# Patient Record
Sex: Female | Born: 1980 | Marital: Married | Smoking: Former smoker
Health system: Southern US, Community
[De-identification: ages and names within clinical notes are randomized; demographics above are authoritative.]

## PROBLEM LIST (undated history)

## (undated) ENCOUNTER — Inpatient Hospital Stay (HOSPITAL_COMMUNITY): Payer: Self-pay

## (undated) DIAGNOSIS — R011 Cardiac murmur, unspecified: Secondary | ICD-10-CM

## (undated) DIAGNOSIS — F53 Postpartum depression: Secondary | ICD-10-CM

## (undated) DIAGNOSIS — I10 Essential (primary) hypertension: Secondary | ICD-10-CM

## (undated) DIAGNOSIS — F418 Other specified anxiety disorders: Secondary | ICD-10-CM

## (undated) DIAGNOSIS — R569 Unspecified convulsions: Secondary | ICD-10-CM

## (undated) DIAGNOSIS — O99345 Other mental disorders complicating the puerperium: Secondary | ICD-10-CM

## (undated) HISTORY — DX: Essential (primary) hypertension: I10

## (undated) HISTORY — DX: Postpartum depression: F53.0

## (undated) HISTORY — DX: Other specified anxiety disorders: F41.8

## (undated) HISTORY — DX: Cardiac murmur, unspecified: R01.1

## (undated) HISTORY — DX: Morbid (severe) obesity due to excess calories: E66.01

## (undated) HISTORY — DX: Unspecified convulsions: R56.9

## (undated) HISTORY — DX: Other mental disorders complicating the puerperium: O99.345

---
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
## 2002-02-23 ENCOUNTER — Inpatient Hospital Stay (HOSPITAL_COMMUNITY): Admission: AD | Admit: 2002-02-23 | Discharge: 2002-02-23 | Payer: Self-pay | Admitting: *Deleted

## 2002-02-24 ENCOUNTER — Encounter: Payer: Self-pay | Admitting: Emergency Medicine

## 2002-02-24 ENCOUNTER — Emergency Department (HOSPITAL_COMMUNITY): Admission: EM | Admit: 2002-02-24 | Discharge: 2002-02-24 | Payer: Self-pay | Admitting: Emergency Medicine

## 2002-06-15 ENCOUNTER — Inpatient Hospital Stay (HOSPITAL_COMMUNITY): Admission: AD | Admit: 2002-06-15 | Discharge: 2002-06-15 | Payer: Self-pay | Admitting: Obstetrics and Gynecology

## 2002-06-18 ENCOUNTER — Inpatient Hospital Stay (HOSPITAL_COMMUNITY): Admission: AD | Admit: 2002-06-18 | Discharge: 2002-06-18 | Payer: Self-pay | Admitting: *Deleted

## 2002-06-25 ENCOUNTER — Inpatient Hospital Stay (HOSPITAL_COMMUNITY): Admission: AD | Admit: 2002-06-25 | Discharge: 2002-06-25 | Payer: Self-pay | Admitting: *Deleted

## 2002-07-05 ENCOUNTER — Inpatient Hospital Stay (HOSPITAL_COMMUNITY): Admission: AD | Admit: 2002-07-05 | Discharge: 2002-07-05 | Payer: Self-pay | Admitting: Obstetrics

## 2002-07-17 ENCOUNTER — Ambulatory Visit (HOSPITAL_COMMUNITY): Admission: RE | Admit: 2002-07-17 | Discharge: 2002-07-17 | Payer: Self-pay | Admitting: Pediatrics

## 2002-07-17 ENCOUNTER — Encounter: Payer: Self-pay | Admitting: Pediatrics

## 2002-08-07 ENCOUNTER — Inpatient Hospital Stay (HOSPITAL_COMMUNITY): Admission: AD | Admit: 2002-08-07 | Discharge: 2002-08-07 | Payer: Self-pay | Admitting: Obstetrics

## 2002-08-07 ENCOUNTER — Encounter: Payer: Self-pay | Admitting: Obstetrics

## 2002-09-06 ENCOUNTER — Inpatient Hospital Stay (HOSPITAL_COMMUNITY): Admission: AD | Admit: 2002-09-06 | Discharge: 2002-09-06 | Payer: Self-pay | Admitting: Obstetrics

## 2002-10-16 ENCOUNTER — Inpatient Hospital Stay (HOSPITAL_COMMUNITY): Admission: AD | Admit: 2002-10-16 | Discharge: 2002-10-16 | Payer: Self-pay | Admitting: Obstetrics

## 2002-12-04 ENCOUNTER — Inpatient Hospital Stay (HOSPITAL_COMMUNITY): Admission: AD | Admit: 2002-12-04 | Discharge: 2002-12-04 | Payer: Self-pay | Admitting: Obstetrics

## 2003-01-12 ENCOUNTER — Inpatient Hospital Stay (HOSPITAL_COMMUNITY): Admission: AD | Admit: 2003-01-12 | Discharge: 2003-01-12 | Payer: Self-pay | Admitting: Obstetrics

## 2003-01-21 ENCOUNTER — Inpatient Hospital Stay (HOSPITAL_COMMUNITY): Admission: AD | Admit: 2003-01-21 | Discharge: 2003-01-21 | Payer: Self-pay | Admitting: Obstetrics

## 2003-02-01 HISTORY — PX: BREAST SURGERY: SHX581

## 2003-02-08 ENCOUNTER — Inpatient Hospital Stay (HOSPITAL_COMMUNITY): Admission: AD | Admit: 2003-02-08 | Discharge: 2003-02-08 | Payer: Self-pay | Admitting: Obstetrics

## 2003-02-12 ENCOUNTER — Inpatient Hospital Stay (HOSPITAL_COMMUNITY): Admission: AD | Admit: 2003-02-12 | Discharge: 2003-02-12 | Payer: Self-pay | Admitting: Obstetrics

## 2003-02-19 ENCOUNTER — Inpatient Hospital Stay (HOSPITAL_COMMUNITY): Admission: AD | Admit: 2003-02-19 | Discharge: 2003-02-21 | Payer: Self-pay | Admitting: Obstetrics

## 2003-05-07 ENCOUNTER — Encounter: Admission: RE | Admit: 2003-05-07 | Discharge: 2003-05-07 | Payer: Self-pay | Admitting: Obstetrics

## 2003-05-15 ENCOUNTER — Encounter: Admission: RE | Admit: 2003-05-15 | Discharge: 2003-05-15 | Payer: Self-pay | Admitting: Obstetrics

## 2003-05-21 ENCOUNTER — Encounter: Admission: RE | Admit: 2003-05-21 | Discharge: 2003-05-21 | Payer: Self-pay | Admitting: Obstetrics

## 2003-06-02 ENCOUNTER — Encounter: Admission: RE | Admit: 2003-06-02 | Discharge: 2003-06-02 | Payer: Self-pay | Admitting: Obstetrics

## 2003-06-04 ENCOUNTER — Encounter (INDEPENDENT_AMBULATORY_CARE_PROVIDER_SITE_OTHER): Payer: Self-pay | Admitting: *Deleted

## 2003-06-04 ENCOUNTER — Ambulatory Visit (HOSPITAL_BASED_OUTPATIENT_CLINIC_OR_DEPARTMENT_OTHER): Admission: RE | Admit: 2003-06-04 | Discharge: 2003-06-04 | Payer: Self-pay | Admitting: *Deleted

## 2003-06-04 ENCOUNTER — Ambulatory Visit (HOSPITAL_COMMUNITY): Admission: RE | Admit: 2003-06-04 | Discharge: 2003-06-04 | Payer: Self-pay | Admitting: *Deleted

## 2003-09-24 ENCOUNTER — Emergency Department (HOSPITAL_COMMUNITY): Admission: EM | Admit: 2003-09-24 | Discharge: 2003-09-24 | Payer: Self-pay | Admitting: Emergency Medicine

## 2004-03-04 ENCOUNTER — Emergency Department (HOSPITAL_COMMUNITY): Admission: EM | Admit: 2004-03-04 | Discharge: 2004-03-04 | Payer: Self-pay | Admitting: Emergency Medicine

## 2004-07-29 ENCOUNTER — Emergency Department (HOSPITAL_COMMUNITY): Admission: EM | Admit: 2004-07-29 | Discharge: 2004-07-29 | Payer: Self-pay | Admitting: Family Medicine

## 2005-02-25 ENCOUNTER — Emergency Department (HOSPITAL_COMMUNITY): Admission: EM | Admit: 2005-02-25 | Discharge: 2005-02-25 | Payer: Self-pay | Admitting: Emergency Medicine

## 2005-05-04 ENCOUNTER — Emergency Department (HOSPITAL_COMMUNITY): Admission: EM | Admit: 2005-05-04 | Discharge: 2005-05-05 | Payer: Self-pay | Admitting: Emergency Medicine

## 2005-06-28 ENCOUNTER — Inpatient Hospital Stay (HOSPITAL_COMMUNITY): Admission: AD | Admit: 2005-06-28 | Discharge: 2005-06-28 | Payer: Self-pay | Admitting: Obstetrics

## 2005-12-02 ENCOUNTER — Inpatient Hospital Stay (HOSPITAL_COMMUNITY): Admission: AD | Admit: 2005-12-02 | Discharge: 2005-12-02 | Payer: Self-pay | Admitting: Obstetrics

## 2005-12-12 ENCOUNTER — Inpatient Hospital Stay (HOSPITAL_COMMUNITY): Admission: AD | Admit: 2005-12-12 | Discharge: 2005-12-12 | Payer: Self-pay | Admitting: Obstetrics

## 2005-12-14 ENCOUNTER — Inpatient Hospital Stay (HOSPITAL_COMMUNITY): Admission: AD | Admit: 2005-12-14 | Discharge: 2005-12-14 | Payer: Self-pay | Admitting: Obstetrics

## 2006-01-12 ENCOUNTER — Emergency Department (HOSPITAL_COMMUNITY): Admission: EM | Admit: 2006-01-12 | Discharge: 2006-01-12 | Payer: Self-pay | Admitting: Family Medicine

## 2007-08-16 ENCOUNTER — Emergency Department (HOSPITAL_COMMUNITY): Admission: EM | Admit: 2007-08-16 | Discharge: 2007-08-17 | Payer: Self-pay | Admitting: Emergency Medicine

## 2008-02-19 ENCOUNTER — Emergency Department (HOSPITAL_COMMUNITY): Admission: EM | Admit: 2008-02-19 | Discharge: 2008-02-19 | Payer: Self-pay | Admitting: Emergency Medicine

## 2008-12-16 ENCOUNTER — Emergency Department (HOSPITAL_BASED_OUTPATIENT_CLINIC_OR_DEPARTMENT_OTHER): Admission: EM | Admit: 2008-12-16 | Discharge: 2008-12-16 | Payer: Self-pay | Admitting: Emergency Medicine

## 2008-12-27 ENCOUNTER — Emergency Department (HOSPITAL_COMMUNITY): Admission: EM | Admit: 2008-12-27 | Discharge: 2008-12-27 | Payer: Self-pay | Admitting: Emergency Medicine

## 2009-01-20 ENCOUNTER — Emergency Department (HOSPITAL_BASED_OUTPATIENT_CLINIC_OR_DEPARTMENT_OTHER): Admission: EM | Admit: 2009-01-20 | Discharge: 2009-01-20 | Payer: Self-pay | Admitting: Emergency Medicine

## 2009-08-02 ENCOUNTER — Emergency Department (HOSPITAL_COMMUNITY): Admission: EM | Admit: 2009-08-02 | Discharge: 2009-08-03 | Payer: Self-pay | Admitting: Emergency Medicine

## 2010-02-17 ENCOUNTER — Inpatient Hospital Stay (HOSPITAL_COMMUNITY)
Admission: AD | Admit: 2010-02-17 | Discharge: 2010-02-17 | Payer: Self-pay | Source: Home / Self Care | Attending: Obstetrics and Gynecology | Admitting: Obstetrics and Gynecology

## 2010-02-22 LAB — URINALYSIS, ROUTINE W REFLEX MICROSCOPIC
Hgb urine dipstick: NEGATIVE
Ketones, ur: NEGATIVE mg/dL
Urine Glucose, Fasting: NEGATIVE mg/dL
Urobilinogen, UA: 0.2 mg/dL (ref 0.0–1.0)
pH: 6 (ref 5.0–8.0)

## 2010-02-22 LAB — GC/CHLAMYDIA PROBE AMP, GENITAL
Chlamydia, DNA Probe: NEGATIVE
GC Probe Amp, Genital: NEGATIVE

## 2010-02-22 LAB — WET PREP, GENITAL: Trich, Wet Prep: NONE SEEN

## 2010-03-24 ENCOUNTER — Other Ambulatory Visit: Payer: Self-pay | Admitting: Family Medicine

## 2010-03-24 DIAGNOSIS — O26849 Uterine size-date discrepancy, unspecified trimester: Secondary | ICD-10-CM

## 2010-04-19 ENCOUNTER — Other Ambulatory Visit: Payer: Self-pay | Admitting: Family Medicine

## 2010-04-19 ENCOUNTER — Inpatient Hospital Stay (HOSPITAL_COMMUNITY)
Admission: AD | Admit: 2010-04-19 | Discharge: 2010-04-20 | DRG: 779 | Disposition: A | Discharge status: Home / Self Care | Payer: Medicaid Other | Source: Ambulatory Visit | Attending: Obstetrics and Gynecology | Admitting: Obstetrics and Gynecology

## 2010-04-19 ENCOUNTER — Ambulatory Visit (HOSPITAL_COMMUNITY)
Admission: RE | Admit: 2010-04-19 | Discharge: 2010-04-19 | Disposition: A | Discharge status: Home / Self Care | Payer: Medicaid Other | Source: Ambulatory Visit | Attending: Family Medicine | Admitting: Family Medicine

## 2010-04-19 DIAGNOSIS — O09299 Supervision of pregnancy with other poor reproductive or obstetric history, unspecified trimester: Secondary | ICD-10-CM | POA: Insufficient documentation

## 2010-04-19 DIAGNOSIS — O26849 Uterine size-date discrepancy, unspecified trimester: Secondary | ICD-10-CM

## 2010-04-19 DIAGNOSIS — O021 Missed abortion: Principal | ICD-10-CM | POA: Diagnosis present

## 2010-04-19 DIAGNOSIS — O365990 Maternal care for other known or suspected poor fetal growth, unspecified trimester, not applicable or unspecified: Secondary | ICD-10-CM

## 2010-04-19 LAB — CBC
MCH: 25.6 pg — ABNORMAL LOW (ref 26.0–34.0)
MCV: 77.1 fL — ABNORMAL LOW (ref 78.0–100.0)

## 2010-04-19 LAB — MRSA PCR SCREENING: MRSA by PCR: NEGATIVE

## 2010-04-20 ENCOUNTER — Other Ambulatory Visit (HOSPITAL_COMMUNITY): Payer: Self-pay | Admitting: Obstetrics and Gynecology

## 2010-05-02 DEATH — deceased

## 2010-05-03 LAB — COMPREHENSIVE METABOLIC PANEL
ALT: 54 U/L — ABNORMAL HIGH (ref 0–35)
AST: 51 U/L — ABNORMAL HIGH (ref 0–37)
Alkaline Phosphatase: 112 U/L (ref 39–117)
Chloride: 108 mEq/L (ref 96–112)
GFR calc non Af Amer: >60 mL/min (ref >60)
Sodium: 141 mEq/L (ref 135–145)
Total Bilirubin: 0.4 mg/dL (ref 0.3–1.2)

## 2010-05-03 LAB — URINALYSIS, ROUTINE W REFLEX MICROSCOPIC
Glucose, UA: NEGATIVE mg/dL
Leukocytes, UA: NEGATIVE
Specific Gravity, Urine: 1.04 — ABNORMAL HIGH (ref 1.005–1.030)
pH: 6 (ref 5.0–8.0)

## 2010-05-03 LAB — URINE MICROSCOPIC-ADD ON

## 2010-05-03 LAB — CBC
HCT: 31 % — ABNORMAL LOW (ref 36.0–46.0)
MCHC: 33.9 g/dL (ref 30.0–36.0)
Platelets: 213 10*3/uL (ref 150–400)
WBC: 7.9 10*3/uL (ref 4.0–10.5)

## 2010-05-03 LAB — DIFFERENTIAL
Basophils Relative: 0 % (ref 0–1)
Lymphocytes Relative: 24 % (ref 12–46)
Monocytes Relative: 7 % (ref 3–12)
Neutro Abs: 5.4 10*3/uL (ref 1.7–7.7)
Neutrophils Relative %: 68 % (ref 43–77)

## 2010-05-03 LAB — LIPASE, BLOOD: Lipase: 53 U/L (ref 23–300)

## 2010-05-03 LAB — PREGNANCY, URINE: Preg Test, Ur: NEGATIVE

## 2010-05-03 LAB — HEMOCCULT GUIAC POC 1CARD (OFFICE): Fecal Occult Bld: NEGATIVE

## 2010-05-11 NOTE — Discharge Summary (Signed)
  NAMEAASHNA, [name]                 ACCOUNT NO.:  [id number]  MEDICAL RECORD NO.:  [id number]           PATIENT TYPE:  I  LOCATION:  9372                          FACILITY:  [facility]  PHYSICIAN:  Catalina Antigua, MD     DATE OF BIRTH:  23-Feb-1980  DATE OF ADMISSION:  04/19/2010 DATE OF DISCHARGE:  04/20/2010                              DISCHARGE SUMMARY   ADMISSION DIAGNOSIS:  A 16-week missed abortion.  DISCHARGE DIAGNOSIS:  A 16-week missed abortion and status post induction of labor.  BRIEF HISTORY AND PHYSICAL:  This is a 30 year old G5, P2-0-2-1, at 18+ weeks' gestation by date who presented for a scheduled anatomy scan and was diagnosed with a 16-week miscarriage.  The patient was counseled regarding induction of labor versus D and E.  After consultation with the father of the baby, the patient opted for induction of labor.  The patient was admitted to the ICU and Cytotec induction was initiated. The patient delivered the fetus and placenta around 2 a.m. on April 20, 2010, with minimal bleeding.  The patient is currently without any complaints.  Denies chest pain, shortness of breath, lightheadedness or dizziness.  Vital signs remained stable and she is afebrile.  The patient is deemed stable for discharge.  The patient was provided with a prescription for methargen to be taken for 24 hours along with ibuprofen for pain control.  The patient has an appointment scheduled in [facility] on May 12, 2010, at 2:30 p.m. for a followup.  The patient does not desire any birth control at this time.  Tissue sample for genetic sent for chromosomal studies were also collected and sent for analysis. The patient was instructed to return to the MAU or contact the clinic if she developed heavy bleeding or severe abdominal pain or fevers.     Catalina Antigua, MD     PC/MEDQ  D:  04/20/2010  T:  2010-05-01  Job:  [id number]  Electronically Signed by Catalina Antigua  on 05/11/2010 10:38:43  AM

## 2010-05-12 ENCOUNTER — Ambulatory Visit: Payer: Medicaid Other | Admitting: Obstetrics and Gynecology

## 2010-05-17 LAB — URINALYSIS, ROUTINE W REFLEX MICROSCOPIC
Glucose, UA: NEGATIVE mg/dL
Hgb urine dipstick: NEGATIVE
Protein, ur: NEGATIVE mg/dL
Specific Gravity, Urine: 1.023 (ref 1.005–1.030)
Urobilinogen, UA: 0.2 mg/dL (ref 0.0–1.0)

## 2010-05-17 LAB — DIFFERENTIAL
Basophils Absolute: 0 10*3/uL (ref 0.0–0.1)
Eosinophils Relative: 0 % (ref 0–5)
Lymphocytes Relative: 6 % — ABNORMAL LOW (ref 12–46)
Lymphs Abs: 0.4 10*3/uL — ABNORMAL LOW (ref 0.7–4.0)
Neutro Abs: 6.2 10*3/uL (ref 1.7–7.7)

## 2010-05-17 LAB — POCT PREGNANCY, URINE: Preg Test, Ur: NEGATIVE

## 2010-05-17 LAB — CBC
HCT: 33 % — ABNORMAL LOW (ref 36.0–46.0)
Platelets: 202 10*3/uL (ref 150–400)
RDW: 13.3 % (ref 11.5–15.5)
WBC: 6.9 10*3/uL (ref 4.0–10.5)

## 2010-06-18 NOTE — Op Note (Signed)
NAME:  [name], [name]                           ACCOUNT NO.:  [id number]   MEDICAL RECORD NO.:  [id number]                   PATIENT TYPE:  OUT   LOCATION:  [facility]                                 FACILITY:  [facility]   PHYSICIAN:  Vikki Ports, M.D.         DATE OF BIRTH:  1980-07-17   DATE OF PROCEDURE:  06/04/2003  DATE OF DISCHARGE:  06/04/2003                                 OPERATIVE REPORT   PREOPERATIVE DIAGNOSIS:  Left breast mass.   POSTOPERATIVE DIAGNOSIS:  Left breast mass.   OPERATION PERFORMED:  Excisional left breast biopsy.   SURGEON:  Vikki Ports, M.D.   ANESTHESIA:  Local MAC.   DESCRIPTION OF PROCEDURE:  The patient was taken to the operating room and  placed in supine position.  After adequate  anesthesia was induced using MAC  technique, the left breast was prepped and draped in the normal sterile  fashion.  Using 1% lidocaine local anesthesia, the skin overlying the mass  was anesthetized.  A curvilinear incision was made near the areola and a  firm mass was delivered up through the wound, excised in its entirety.  Adequate hemostasis was ensured and the skin was closed with a subcuticular  4-0 Monocryl.  Steri-Strips and sterile dressings were applied.  The patient  tolerated the procedure well and went to PACU in good condition.                                               Vikki Ports, M.D.    KR[facility]/MEDQ  D:  07/22/2003  T:  07/23/2003  Job:  [id number]

## 2010-07-03 ENCOUNTER — Inpatient Hospital Stay (HOSPITAL_COMMUNITY)
Admission: EM | Admit: 2010-07-03 | Discharge: 2010-07-03 | Disposition: A | Discharge status: Home / Self Care | Payer: Medicaid Other | Source: Ambulatory Visit | Attending: Obstetrics & Gynecology | Admitting: Obstetrics & Gynecology

## 2010-07-03 ENCOUNTER — Inpatient Hospital Stay (HOSPITAL_COMMUNITY): Payer: Medicaid Other

## 2010-07-03 DIAGNOSIS — A499 Bacterial infection, unspecified: Secondary | ICD-10-CM

## 2010-07-03 DIAGNOSIS — R109 Unspecified abdominal pain: Secondary | ICD-10-CM

## 2010-07-03 DIAGNOSIS — N760 Acute vaginitis: Secondary | ICD-10-CM | POA: Insufficient documentation

## 2010-07-03 DIAGNOSIS — B9689 Other specified bacterial agents as the cause of diseases classified elsewhere: Secondary | ICD-10-CM | POA: Insufficient documentation

## 2010-07-03 DIAGNOSIS — O2390 Unspecified genitourinary tract infection in pregnancy, unspecified trimester: Secondary | ICD-10-CM

## 2010-07-03 LAB — CBC
MCV: 78 fL (ref 78.0–100.0)
Platelets: 214 10*3/uL (ref 150–400)
RBC: 4.18 MIL/uL (ref 3.87–5.11)
RDW: 13.3 % (ref 11.5–15.5)
WBC: 7.1 10*3/uL (ref 4.0–10.5)

## 2010-07-03 LAB — POCT PREGNANCY, URINE: Preg Test, Ur: POSITIVE

## 2010-07-03 LAB — URINALYSIS, ROUTINE W REFLEX MICROSCOPIC
Nitrite: NEGATIVE
Specific Gravity, Urine: 1.015 (ref 1.005–1.030)
Urobilinogen, UA: 0.2 mg/dL (ref 0.0–1.0)
pH: 6 (ref 5.0–8.0)

## 2010-07-03 LAB — ABO/RH: ABO/RH(D): B POS

## 2010-07-03 LAB — HCG, QUANTITATIVE, PREGNANCY: hCG, Beta Chain, Quant, S: 11758 m[IU]/mL — ABNORMAL HIGH (ref <5)

## 2010-07-03 LAB — WET PREP, GENITAL
Trich, Wet Prep: NONE SEEN
Yeast Wet Prep HPF POC: NONE SEEN

## 2010-07-05 LAB — GC/CHLAMYDIA PROBE AMP, GENITAL: Chlamydia, DNA Probe: NEGATIVE

## 2010-07-26 ENCOUNTER — Other Ambulatory Visit (HOSPITAL_COMMUNITY): Payer: Self-pay | Admitting: Obstetrics

## 2010-08-03 ENCOUNTER — Inpatient Hospital Stay (HOSPITAL_COMMUNITY)
Admission: AD | Admit: 2010-08-03 | Discharge: 2010-08-03 | Disposition: A | Discharge status: Home / Self Care | Payer: Medicaid Other | Source: Ambulatory Visit | Attending: Obstetrics | Admitting: Obstetrics

## 2010-08-03 ENCOUNTER — Ambulatory Visit (HOSPITAL_COMMUNITY)
Admission: RE | Admit: 2010-08-03 | Discharge: 2010-08-03 | Disposition: A | Discharge status: Home / Self Care | Payer: Medicaid Other | Source: Ambulatory Visit | Attending: Obstetrics | Admitting: Obstetrics

## 2010-08-03 DIAGNOSIS — H6690 Otitis media, unspecified, unspecified ear: Secondary | ICD-10-CM | POA: Insufficient documentation

## 2010-08-03 DIAGNOSIS — O2690 Pregnancy related conditions, unspecified, unspecified trimester: Secondary | ICD-10-CM

## 2010-08-03 DIAGNOSIS — O99891 Other specified diseases and conditions complicating pregnancy: Secondary | ICD-10-CM | POA: Insufficient documentation

## 2010-08-03 DIAGNOSIS — O2620 Pregnancy care for patient with recurrent pregnancy loss, unspecified trimester: Secondary | ICD-10-CM | POA: Insufficient documentation

## 2010-08-03 DIAGNOSIS — O9989 Other specified diseases and conditions complicating pregnancy, childbirth and the puerperium: Secondary | ICD-10-CM

## 2010-08-17 ENCOUNTER — Ambulatory Visit (HOSPITAL_COMMUNITY)
Admission: RE | Admit: 2010-08-17 | Discharge: 2010-08-17 | Disposition: A | Discharge status: Home / Self Care | Payer: Medicaid Other | Source: Ambulatory Visit | Attending: Obstetrics | Admitting: Obstetrics

## 2010-08-17 ENCOUNTER — Encounter (HOSPITAL_COMMUNITY): Payer: Self-pay

## 2010-08-17 DIAGNOSIS — O2620 Pregnancy care for patient with recurrent pregnancy loss, unspecified trimester: Secondary | ICD-10-CM | POA: Insufficient documentation

## 2010-08-17 DIAGNOSIS — Z3689 Encounter for other specified antenatal screening: Secondary | ICD-10-CM | POA: Insufficient documentation

## 2010-08-20 LAB — RPR: RPR: NONREACTIVE

## 2010-09-01 ENCOUNTER — Inpatient Hospital Stay (HOSPITAL_COMMUNITY)
Admission: AD | Admit: 2010-09-01 | Payer: Medicaid Other | Source: Ambulatory Visit | Admitting: Obstetrics & Gynecology

## 2010-09-07 ENCOUNTER — Other Ambulatory Visit: Payer: Self-pay | Admitting: Maternal and Fetal Medicine

## 2010-09-07 ENCOUNTER — Ambulatory Visit (HOSPITAL_COMMUNITY)
Admission: RE | Admit: 2010-09-07 | Discharge: 2010-09-07 | Disposition: A | Discharge status: Home / Self Care | Payer: Medicaid Other | Source: Ambulatory Visit | Attending: Obstetrics | Admitting: Obstetrics

## 2010-09-07 DIAGNOSIS — O351XX0 Maternal care for (suspected) chromosomal abnormality in fetus, not applicable or unspecified: Secondary | ICD-10-CM | POA: Insufficient documentation

## 2010-09-07 DIAGNOSIS — O3510X0 Maternal care for (suspected) chromosomal abnormality in fetus, unspecified, not applicable or unspecified: Secondary | ICD-10-CM | POA: Insufficient documentation

## 2010-09-07 NOTE — ED Notes (Signed)
Patient here for second NT blood draw and wanted to hear fetus heartrate with her obstetrical history.  Unable to obtain FHR on monitor; therefore, doppler obtained and able to hear FHR at 160-170 BPM.  Patient was reassured.  Weight today 258 lbs.

## 2010-09-12 ENCOUNTER — Inpatient Hospital Stay (HOSPITAL_COMMUNITY)
Admission: AD | Admit: 2010-09-12 | Discharge: 2010-09-12 | Disposition: A | Discharge status: Home / Self Care | Payer: Medicaid Other | Source: Ambulatory Visit | Attending: Obstetrics | Admitting: Obstetrics

## 2010-09-12 DIAGNOSIS — O2390 Unspecified genitourinary tract infection in pregnancy, unspecified trimester: Secondary | ICD-10-CM | POA: Insufficient documentation

## 2010-09-12 DIAGNOSIS — A499 Bacterial infection, unspecified: Secondary | ICD-10-CM | POA: Insufficient documentation

## 2010-09-12 DIAGNOSIS — R109 Unspecified abdominal pain: Secondary | ICD-10-CM | POA: Insufficient documentation

## 2010-09-12 DIAGNOSIS — B9689 Other specified bacterial agents as the cause of diseases classified elsewhere: Secondary | ICD-10-CM | POA: Insufficient documentation

## 2010-09-12 DIAGNOSIS — N760 Acute vaginitis: Secondary | ICD-10-CM | POA: Insufficient documentation

## 2010-09-12 LAB — WET PREP, GENITAL

## 2010-09-12 LAB — URINALYSIS, ROUTINE W REFLEX MICROSCOPIC
Glucose, UA: NEGATIVE mg/dL
Ketones, ur: NEGATIVE mg/dL
Leukocytes, UA: NEGATIVE
Protein, ur: NEGATIVE mg/dL
Urobilinogen, UA: 0.2 mg/dL (ref 0.0–1.0)

## 2010-09-12 MED ORDER — BUTALBITAL-APAP-CAFFEINE 50-325-40 MG PO TABS
2.0000 | ORAL_TABLET | ORAL | Status: DC | PRN
  Administered 2010-09-12: 2 via ORAL
  Filled 2010-09-12: qty 2

## 2010-09-12 MED ORDER — METRONIDAZOLE 500 MG PO TABS: 500.0000 mg | ORAL_TABLET | Freq: Two times a day (BID) | ORAL | Status: AC

## 2010-09-12 NOTE — Progress Notes (Signed)
C/ o sharp, low, abd pain and a "really bad headache" that started yesterday and not relieved by 2 ES tylenol.

## 2010-09-12 NOTE — Progress Notes (Signed)
Headache that wont go away, ear infection that wont go away.  16 wks preg.  g6p1

## 2010-09-12 NOTE — Progress Notes (Signed)
Pt c/o "really bad headache and

## 2010-09-12 NOTE — ED Provider Notes (Signed)
PT c/o lower abdominal cramping over the past several days.  Also c/o an ear infection for a month and a headache yesterday, not as bad today.  Offered fioricet and accepted.. SSE: normal appearing discharge, cx LTC, firm  A:  Bacterial Vaginosis Abdominal pain in pregnancy  Plan: DC to home RX Flagyl Keep scheduled appointment with Dr. Gaynell Face Return for worsening of symptoms.  [facility]

## 2010-09-14 LAB — GC/CHLAMYDIA PROBE AMP, GENITAL: GC Probe Amp, Genital: NEGATIVE

## 2010-10-03 ENCOUNTER — Encounter (HOSPITAL_COMMUNITY): Payer: Self-pay

## 2010-10-03 ENCOUNTER — Inpatient Hospital Stay (HOSPITAL_COMMUNITY)
Admission: AD | Admit: 2010-10-03 | Discharge: 2010-10-03 | Disposition: A | Discharge status: Home / Self Care | Payer: Medicaid Other | Source: Ambulatory Visit | Attending: Obstetrics | Admitting: Obstetrics

## 2010-10-03 DIAGNOSIS — R55 Syncope and collapse: Secondary | ICD-10-CM | POA: Insufficient documentation

## 2010-10-03 DIAGNOSIS — O99019 Anemia complicating pregnancy, unspecified trimester: Secondary | ICD-10-CM | POA: Insufficient documentation

## 2010-10-03 DIAGNOSIS — Z0379 Encounter for other suspected maternal and fetal conditions ruled out: Secondary | ICD-10-CM | POA: Insufficient documentation

## 2010-10-03 DIAGNOSIS — O9989 Other specified diseases and conditions complicating pregnancy, childbirth and the puerperium: Secondary | ICD-10-CM | POA: Insufficient documentation

## 2010-10-03 DIAGNOSIS — D509 Iron deficiency anemia, unspecified: Secondary | ICD-10-CM | POA: Insufficient documentation

## 2010-10-03 LAB — BASIC METABOLIC PANEL
BUN: 7 mg/dL (ref 6–23)
CO2: 23 mEq/L (ref 19–32)
Chloride: 102 mEq/L (ref 96–112)
GFR calc Af Amer: >60 mL/min (ref >60)
Glucose, Bld: 95 mg/dL (ref 70–99)
Potassium: 3.8 mEq/L (ref 3.5–5.1)

## 2010-10-03 LAB — CBC
HCT: 32.6 % — ABNORMAL LOW (ref 36.0–46.0)
Hemoglobin: 10.9 g/dL — ABNORMAL LOW (ref 12.0–15.0)
RBC: 4.28 MIL/uL (ref 3.87–5.11)
RDW: 13 % (ref 11.5–15.5)
WBC: 10.1 10*3/uL (ref 4.0–10.5)

## 2010-10-03 LAB — URINALYSIS, ROUTINE W REFLEX MICROSCOPIC
Bilirubin Urine: NEGATIVE
Glucose, UA: NEGATIVE mg/dL
Hgb urine dipstick: NEGATIVE
Ketones, ur: NEGATIVE mg/dL
Protein, ur: NEGATIVE mg/dL
pH: 6 (ref 5.0–8.0)

## 2010-10-03 LAB — URINE MICROSCOPIC-ADD ON

## 2010-10-03 NOTE — ED Provider Notes (Signed)
[name] is a 30 y.o. [id number] at @19 .1 wk Chief Complaint  Patient presents with  . Dizziness  . Decreased Fetal Movement   HPI   Prior to arrival she reports having a dizzy spell during which she felt like she was bout to faint.  She says that for about 20 seconds she had rating in diminished hearing in her ears. No previous fainting history. She has not eaten anything since breakfast. She is worried about the baby since she did not feel fetal movement until she arrived in maternity admissions unit. She had previous SIDS and last pregnancy had a fetal demise at 20 weeks.  Past Medical History  Diagnosis Date  . No pertinent past medical history    Past Surgical History  Procedure Date  . No past surgeries    Allergies  Allergen Reactions  . Codeine Nausea And Vomiting   Review of Systems  Constitutional: Negative for fever, chills and diaphoresis.  HENT: Negative for ear pain and tinnitus.   Respiratory: Negative.   Cardiovascular: Negative for palpitations.  Gastrointestinal: Negative.   Genitourinary: Negative.   Neurological: Negative for weakness and headaches.  No vaginal bleeding. No cramping or UCs.   Physical Exam  Gen. No apparent distress  Coronary: RRR with Gr I syst flow murmur  Lungs: Clear to auscultation bilaterally   Abdomen: Soft nontender consistent with dates.  Normal DT FHT Results for orders placed during the hospital encounter of 10/03/10 (from the past 24 hour(s))  URINALYSIS, ROUTINE W REFLEX MICROSCOPIC     Status: Abnormal   Collection Time   10/03/10  8:53 PM      Component Value Range   Color, Urine YELLOW  YELLOW    Appearance HAZY (*) CLEAR    Specific Gravity, Urine >1.030 (*) 1.005 - 1.030    pH 6.0  5.0 - 8.0    Glucose, UA NEGATIVE  NEGATIVE (mg/dL)   Hgb urine dipstick NEGATIVE  NEGATIVE    Bilirubin Urine NEGATIVE  NEGATIVE    Ketones, ur NEGATIVE  NEGATIVE (mg/dL)   Protein, ur NEGATIVE  NEGATIVE (mg/dL)   Urobilinogen, UA 0.2  0.0 - 1.0 (mg/dL)   Nitrite NEGATIVE  NEGATIVE    Leukocytes, UA TRACE (*) NEGATIVE   URINE MICROSCOPIC-ADD ON     Status: Abnormal   Collection Time   10/03/10  8:53 PM      Component Value Range   Squamous Epithelial / LPF MANY (*) RARE    WBC, UA 3-6  <3 (WBC/hpf)   Bacteria, UA MANY (*) RARE    Urine-Other MUCOUS PRESENT    CBC     Status: Abnormal   Collection Time   10/03/10 10:10 PM      Component Value Range   WBC 10.1  4.0 - 10.5 (K/uL)   RBC 4.28  3.87 - 5.11 (MIL/uL)   Hemoglobin 10.9 (*) 12.0 - 15.0 (g/dL)   HCT 45.4 (*) 09.8 - 46.0 (%)   MCV 76.2 (*) 78.0 - 100.0 (fL)   MCH 25.5 (*) 26.0 - 34.0 (pg)   MCHC 33.4  30.0 - 36.0 (g/dL)   RDW 11.9  14.7 - 82.9 (%)   Platelets 220  150 - 400 (K/uL)  BASIC METABOLIC PANEL     Status: Abnormal   Collection Time   10/03/10 10:10 PM      Component Value Range   Sodium 133 (*) 135 - 145 (mEq/L)   Potassium 3.8  3.5 - 5.1 (mEq/L)  Chloride 102  96 - 112 (mEq/L)   CO2 23  19 - 32 (mEq/L)   Glucose, Bld 95  70 - 99 (mg/dL)   BUN 7  6 - 23 (mg/dL)   Creatinine, Ser 2.13  0.50 - 1.10 (mg/dL)   Calcium 9.7  8.4 - 08.6 (mg/dL)   GFR calc non Af Amer >60  >60 (mL/min)   GFR calc Af Amer >60  >60 (mL/min)   Assessment  Presyncope; 19 week pregnancy; Fe defic anemia  Plan  Advised to add high iron foods and not to skip any meals. Add ferrous sulfate 325 mg tablet one a day.

## 2010-10-03 NOTE — Progress Notes (Signed)
Pt G4 P1 at 19.1wks, around 1400 had a dizzy episode, felt light headed, no fetal movement since 1400.

## 2010-12-22 ENCOUNTER — Encounter (HOSPITAL_COMMUNITY): Payer: Self-pay | Admitting: *Deleted

## 2010-12-22 ENCOUNTER — Inpatient Hospital Stay (HOSPITAL_COMMUNITY)
Admission: AD | Admit: 2010-12-22 | Discharge: 2010-12-22 | Disposition: A | Discharge status: Home / Self Care | Payer: Medicaid Other | Source: Ambulatory Visit | Attending: Obstetrics | Admitting: Obstetrics

## 2010-12-22 DIAGNOSIS — Z3480 Encounter for supervision of other normal pregnancy, unspecified trimester: Secondary | ICD-10-CM

## 2010-12-22 DIAGNOSIS — Z3490 Encounter for supervision of normal pregnancy, unspecified, unspecified trimester: Secondary | ICD-10-CM

## 2010-12-22 DIAGNOSIS — R109 Unspecified abdominal pain: Secondary | ICD-10-CM | POA: Insufficient documentation

## 2010-12-22 DIAGNOSIS — O99891 Other specified diseases and conditions complicating pregnancy: Secondary | ICD-10-CM | POA: Insufficient documentation

## 2010-12-22 LAB — URINALYSIS, ROUTINE W REFLEX MICROSCOPIC
Bilirubin Urine: NEGATIVE
Glucose, UA: NEGATIVE mg/dL
Nitrite: NEGATIVE
Specific Gravity, Urine: >1.03 — ABNORMAL HIGH (ref 1.005–1.030)
pH: 6 (ref 5.0–8.0)

## 2010-12-22 LAB — COMPREHENSIVE METABOLIC PANEL
ALT: 19 U/L (ref 0–35)
Alkaline Phosphatase: 119 U/L — ABNORMAL HIGH (ref 39–117)
CO2: 22 mEq/L (ref 19–32)
Calcium: 9.8 mg/dL (ref 8.4–10.5)
GFR calc Af Amer: >90 mL/min (ref >90)
GFR calc non Af Amer: >90 mL/min (ref >90)
Glucose, Bld: 90 mg/dL (ref 70–99)
Potassium: 3.7 mEq/L (ref 3.5–5.1)
Sodium: 133 mEq/L — ABNORMAL LOW (ref 135–145)

## 2010-12-22 LAB — CBC
Hemoglobin: 10 g/dL — ABNORMAL LOW (ref 12.0–15.0)
MCH: 24.3 pg — ABNORMAL LOW (ref 26.0–34.0)
RBC: 4.11 MIL/uL (ref 3.87–5.11)

## 2010-12-22 MED ORDER — HYDROMORPHONE HCL PF 1 MG/ML IJ SOLN: 1.0000 mg | Freq: Once | INTRAMUSCULAR | Status: DC

## 2010-12-22 NOTE — ED Notes (Signed)
Ice pack applied to patient upper abdomen for pain relief

## 2010-12-22 NOTE — Progress Notes (Signed)
Pt G6 P1 at 30.3wks having upper abd pain x 3 days, becoming worse today.  Denies bleeding, discharge or leaking fluid.

## 2011-01-28 LAB — STREP B DNA PROBE: GBS: POSITIVE

## 2011-02-01 NOTE — L&D Delivery Note (Signed)
Cesarean Section Procedure Note   [name]   02/24/2011  Indications: Umbilical cord prolapse   Pre-operative Diagnosis: Occult Cord.   Post-operative Diagnosis: Same   Surgeon: Mouhamadou Gittleman A  Assistants: Surgical technician  Anesthesia: epidural  Procedure Details:  The patient was seen in the Holding Room. The risks, benefits, complications, treatment options, and expected outcomes were discussed with the patient. The patient concurred with the proposed plan, giving informed consent. The patient was identified as [name] and the procedure verified as C-Section Delivery. A Time Out was held and the above information confirmed.  After induction of anesthesia, the patient was draped and prepped in the usual sterile manner. A transverse incision was made and carried down through the subcutaneous tissue to the fascia. The fascial incision was made and extended transversely. The fascia was separated from the underlying rectus tissue superiorly and inferiorly. The peritoneum was identified and entered. The peritoneal incision was extended longitudinally. The utero-vesical peritoneal reflection was incised transversely and the bladder flap was bluntly freed from the lower uterine segment. A low transverse uterine incision was made. Delivered from cephalic presentation was a 3430 gram living newborn female infant(s) with Apgar scores of 9 at one minute and 9 at five minutes. A cord ph was sent. The umbilical cord was clamped and cut cord. A sample was obtained for evaluation. The placenta was removed Intact and appeared normal.  The uterine incision was closed with running locked sutures of 1-0 Monocryl. A second imbricating layer of the same suture was placed.  Hemostasis was observed. The paracolic gutters were irrigated.  The peritoneum closed with continuous suture of 2-0 Vicryl.  The fascia was then reapproximated with running sutures of 0 Vicryl. The skin closed with staples.    Instrument, sponge, and needle counts were correct prior the abdominal closure and were correct at the conclusion of the case.    Findings:   Estimated Blood Loss:  Total IV Fluids:   Urine Output: 200CC OF clear urine  Specimens:  Placenta   Complications: no complications  Disposition: PACU - hemodynamically stable.  Maternal Condition: stable   Baby condition / location:  nursery-stable    Signed: Surgeon(s): Brock Bad, MD

## 2011-02-21 ENCOUNTER — Encounter (HOSPITAL_COMMUNITY): Payer: Self-pay | Admitting: *Deleted

## 2011-02-21 ENCOUNTER — Telehealth (HOSPITAL_COMMUNITY): Payer: Self-pay | Admitting: *Deleted

## 2011-02-21 NOTE — Telephone Encounter (Signed)
Preadmission screen  

## 2011-02-23 ENCOUNTER — Encounter (HOSPITAL_COMMUNITY): Payer: Self-pay | Admitting: *Deleted

## 2011-02-24 ENCOUNTER — Other Ambulatory Visit: Payer: Self-pay | Admitting: Obstetrics

## 2011-02-24 ENCOUNTER — Inpatient Hospital Stay (HOSPITAL_COMMUNITY)
Admission: AD | Admit: 2011-02-24 | Discharge: 2011-02-28 | DRG: 765 | Disposition: A | Discharge status: Home / Self Care | Payer: Medicaid Other | Source: Ambulatory Visit | Attending: Obstetrics | Admitting: Obstetrics

## 2011-02-24 ENCOUNTER — Encounter (HOSPITAL_COMMUNITY): Payer: Self-pay | Admitting: Anesthesiology

## 2011-02-24 ENCOUNTER — Encounter (HOSPITAL_COMMUNITY)
Admission: AD | Disposition: A | Discharge status: Home / Self Care | Payer: Self-pay | Source: Ambulatory Visit | Attending: Obstetrics

## 2011-02-24 ENCOUNTER — Encounter (HOSPITAL_COMMUNITY): Payer: Self-pay | Admitting: *Deleted

## 2011-02-24 ENCOUNTER — Inpatient Hospital Stay (HOSPITAL_COMMUNITY): Payer: Medicaid Other | Admitting: Anesthesiology

## 2011-02-24 DIAGNOSIS — O409XX0 Polyhydramnios, unspecified trimester, not applicable or unspecified: Secondary | ICD-10-CM | POA: Diagnosis present

## 2011-02-24 DIAGNOSIS — O99892 Other specified diseases and conditions complicating childbirth: Secondary | ICD-10-CM | POA: Diagnosis present

## 2011-02-24 DIAGNOSIS — Z22330 Carrier of Group B streptococcus: Secondary | ICD-10-CM

## 2011-02-24 DIAGNOSIS — Z3490 Encounter for supervision of normal pregnancy, unspecified, unspecified trimester: Secondary | ICD-10-CM

## 2011-02-24 DIAGNOSIS — D649 Anemia, unspecified: Secondary | ICD-10-CM | POA: Diagnosis not present

## 2011-02-24 DIAGNOSIS — O9903 Anemia complicating the puerperium: Secondary | ICD-10-CM | POA: Diagnosis not present

## 2011-02-24 LAB — RPR: RPR Ser Ql: NONREACTIVE

## 2011-02-24 LAB — CBC
Hemoglobin: 10.4 g/dL — ABNORMAL LOW (ref 12.0–15.0)
MCH: 23.1 pg — ABNORMAL LOW (ref 26.0–34.0)
MCH: 23.5 pg — ABNORMAL LOW (ref 26.0–34.0)
MCV: 72.3 fL — ABNORMAL LOW (ref 78.0–100.0)
Platelets: 287 10*3/uL (ref 150–400)
Platelets: 223 10*3/uL (ref 150–400)
RBC: 4.51 MIL/uL (ref 3.87–5.11)
RBC: 3.79 MIL/uL — ABNORMAL LOW (ref 3.87–5.11)
WBC: 11.3 10*3/uL — ABNORMAL HIGH (ref 4.0–10.5)

## 2011-02-24 SURGERY — Surgical Case
Anesthesia: Regional | Site: Abdomen | Wound class: Clean Contaminated

## 2011-02-24 MED ORDER — NALBUPHINE SYRINGE 5 MG/0.5 ML
10.0000 mg | INJECTION | INTRAMUSCULAR | Status: DC | PRN
  Administered 2011-02-24: 10 mg via INTRAVENOUS
  Filled 2011-02-24: qty 2

## 2011-02-24 MED ORDER — METHYLERGONOVINE MALEATE 0.2 MG/ML IJ SOLN: 0.2000 mg | INTRAMUSCULAR | Status: DC | PRN

## 2011-02-24 MED ORDER — SODIUM CHLORIDE 0.9 % IV SOLN: 1.0000 ug/kg/h | INTRAVENOUS | Status: DC | PRN

## 2011-02-24 MED ORDER — LACTATED RINGERS IV SOLN: 500.0000 mL | INTRAVENOUS | Status: DC | PRN

## 2011-02-24 MED ORDER — HYDROMORPHONE HCL 2 MG PO TABS: 2.0000 mg | ORAL_TABLET | ORAL | Status: DC | PRN

## 2011-02-24 MED ORDER — MORPHINE SULFATE (PF) 0.5 MG/ML IJ SOLN
INTRAMUSCULAR | Status: DC | PRN
  Administered 2011-02-24: 2 mg via INTRAVENOUS

## 2011-02-24 MED ORDER — NALOXONE HCL 0.4 MG/ML IJ SOLN: 0.4000 mg | INTRAMUSCULAR | Status: DC | PRN

## 2011-02-24 MED ORDER — ONDANSETRON HCL 4 MG PO TABS: 4.0000 mg | ORAL_TABLET | ORAL | Status: DC | PRN

## 2011-02-24 MED ORDER — SIMETHICONE 80 MG PO CHEW
80.0000 mg | CHEWABLE_TABLET | Freq: Three times a day (TID) | ORAL | Status: DC
  Administered 2011-02-24 – 2011-02-28 (×13): 80 mg via ORAL

## 2011-02-24 MED ORDER — TETANUS-DIPHTH-ACELL PERTUSSIS 5-2.5-18.5 LF-MCG/0.5 IM SUSP
0.5000 mL | Freq: Once | INTRAMUSCULAR | Status: DC
  Filled 2011-02-24: qty 0.5

## 2011-02-24 MED ORDER — FENTANYL CITRATE 0.05 MG/ML IJ SOLN: 25.0000 ug | INTRAMUSCULAR | Status: DC | PRN

## 2011-02-24 MED ORDER — SODIUM CHLORIDE 0.9 % IV SOLN
2.0000 g | Freq: Once | INTRAVENOUS | Status: AC
  Administered 2011-02-24: 2 g via INTRAVENOUS
  Filled 2011-02-24: qty 2000

## 2011-02-24 MED ORDER — IBUPROFEN 600 MG PO TABS: 600.0000 mg | ORAL_TABLET | Freq: Four times a day (QID) | ORAL | Status: DC | PRN

## 2011-02-24 MED ORDER — TERBUTALINE SULFATE 1 MG/ML IJ SOLN
0.2500 mg | Freq: Once | INTRAMUSCULAR | Status: AC | PRN
  Administered 2011-02-24: 1 mg via SUBCUTANEOUS
  Filled 2011-02-24: qty 1

## 2011-02-24 MED ORDER — FENTANYL CITRATE 0.05 MG/ML IJ SOLN
INTRAMUSCULAR | Status: DC | PRN
  Administered 2011-02-24: 50 ug via INTRAVENOUS

## 2011-02-24 MED ORDER — DIPHENHYDRAMINE HCL 50 MG/ML IJ SOLN: 12.5000 mg | INTRAMUSCULAR | Status: DC | PRN

## 2011-02-24 MED ORDER — OXYTOCIN 20 UNITS IN LACTATED RINGERS INFUSION - SIMPLE
125.0000 mL/h | INTRAVENOUS | Status: AC
  Administered 2011-02-24: 125 mL/h via INTRAVENOUS

## 2011-02-24 MED ORDER — MENTHOL 3 MG MT LOZG
1.0000 | LOZENGE | OROMUCOSAL | Status: DC | PRN
  Filled 2011-02-24: qty 9

## 2011-02-24 MED ORDER — OXYTOCIN 20 UNITS IN LACTATED RINGERS INFUSION - SIMPLE
INTRAVENOUS | Status: AC
  Filled 2011-02-24: qty 1000

## 2011-02-24 MED ORDER — KETOROLAC TROMETHAMINE 30 MG/ML IJ SOLN: 30.0000 mg | Freq: Four times a day (QID) | INTRAMUSCULAR | Status: AC | PRN

## 2011-02-24 MED ORDER — KETOROLAC TROMETHAMINE 60 MG/2ML IM SOLN
INTRAMUSCULAR | Status: AC
  Filled 2011-02-24: qty 2

## 2011-02-24 MED ORDER — METOCLOPRAMIDE HCL 5 MG/ML IJ SOLN: 10.0000 mg | Freq: Three times a day (TID) | INTRAMUSCULAR | Status: DC | PRN

## 2011-02-24 MED ORDER — EPHEDRINE 5 MG/ML INJ: 10.0000 mg | INTRAVENOUS | Status: DC | PRN

## 2011-02-24 MED ORDER — LACTATED RINGERS IV SOLN
INTRAVENOUS | Status: DC
  Administered 2011-02-24: 16:00:00 via INTRAVENOUS
  Administered 2011-02-24: 1000 mL via INTRAVENOUS

## 2011-02-24 MED ORDER — FENTANYL CITRATE 0.05 MG/ML IJ SOLN
INTRAMUSCULAR | Status: AC
  Filled 2011-02-24: qty 5

## 2011-02-24 MED ORDER — FLEET ENEMA 7-19 GM/118ML RE ENEM: 1.0000 | ENEMA | RECTAL | Status: DC | PRN

## 2011-02-24 MED ORDER — PENICILLIN G POTASSIUM 5000000 UNITS IJ SOLR
2.5000 10*6.[IU] | INTRAVENOUS | Status: DC
  Filled 2011-02-24 (×2): qty 2.5

## 2011-02-24 MED ORDER — SCOPOLAMINE 1 MG/3DAYS TD PT72
1.0000 | MEDICATED_PATCH | Freq: Once | TRANSDERMAL | Status: AC
  Administered 2011-02-24: 1.5 mg via TRANSDERMAL

## 2011-02-24 MED ORDER — ONDANSETRON HCL 4 MG/2ML IJ SOLN
INTRAMUSCULAR | Status: DC | PRN
  Administered 2011-02-24: 4 mg via INTRAVENOUS

## 2011-02-24 MED ORDER — PHENYLEPHRINE 40 MCG/ML (10ML) SYRINGE FOR IV PUSH (FOR BLOOD PRESSURE SUPPORT)
80.0000 ug | PREFILLED_SYRINGE | INTRAVENOUS | Status: DC | PRN
  Filled 2011-02-24: qty 5

## 2011-02-24 MED ORDER — NALBUPHINE SYRINGE 5 MG/0.5 ML: 10.0000 mg | INJECTION | Freq: Four times a day (QID) | INTRAMUSCULAR | Status: DC | PRN

## 2011-02-24 MED ORDER — MEDROXYPROGESTERONE ACETATE 150 MG/ML IM SUSP: 150.0000 mg | INTRAMUSCULAR | Status: DC | PRN

## 2011-02-24 MED ORDER — ONDANSETRON HCL 4 MG/2ML IJ SOLN
INTRAMUSCULAR | Status: AC
  Filled 2011-02-24: qty 2

## 2011-02-24 MED ORDER — SENNOSIDES-DOCUSATE SODIUM 8.6-50 MG PO TABS
2.0000 | ORAL_TABLET | Freq: Every day | ORAL | Status: DC
  Administered 2011-02-24 – 2011-02-27 (×4): 2 via ORAL

## 2011-02-24 MED ORDER — MAGNESIUM HYDROXIDE 400 MG/5ML PO SUSP
30.0000 mL | ORAL | Status: DC | PRN
  Filled 2011-02-24: qty 30

## 2011-02-24 MED ORDER — OXYTOCIN 20 UNITS IN LACTATED RINGERS INFUSION - SIMPLE
1.0000 m[IU]/min | INTRAVENOUS | Status: DC
  Administered 2011-02-24: 4 m[IU]/min via INTRAVENOUS
  Administered 2011-02-24: 2 m[IU]/min via INTRAVENOUS
  Administered 2011-02-24: 6 m[IU]/min via INTRAVENOUS
  Filled 2011-02-24: qty 1000

## 2011-02-24 MED ORDER — SODIUM CHLORIDE 0.9 % IV SOLN
1.0000 g | INTRAVENOUS | Status: DC
  Administered 2011-02-24: 1 g via INTRAVENOUS
  Filled 2011-02-24 (×5): qty 1000

## 2011-02-24 MED ORDER — NALBUPHINE HCL 10 MG/ML IJ SOLN: 5.0000 mg | INTRAMUSCULAR | Status: DC | PRN

## 2011-02-24 MED ORDER — SODIUM BICARBONATE 8.4 % IV SOLN
INTRAVENOUS | Status: AC
  Filled 2011-02-24: qty 50

## 2011-02-24 MED ORDER — SODIUM CHLORIDE 0.9 % IJ SOLN: 3.0000 mL | INTRAMUSCULAR | Status: DC | PRN

## 2011-02-24 MED ORDER — NALBUPHINE HCL 10 MG/ML IJ SOLN: 10.0000 mg | INTRAMUSCULAR | Status: DC | PRN

## 2011-02-24 MED ORDER — MORPHINE SULFATE (PF) 0.5 MG/ML IJ SOLN
INTRAMUSCULAR | Status: DC | PRN
  Administered 2011-02-24: 3 mg via EPIDURAL

## 2011-02-24 MED ORDER — KETOROLAC TROMETHAMINE 60 MG/2ML IM SOLN
60.0000 mg | Freq: Once | INTRAMUSCULAR | Status: AC | PRN
  Administered 2011-02-24: 60 mg via INTRAMUSCULAR

## 2011-02-24 MED ORDER — HYDROXYZINE HCL 50 MG/ML IM SOLN
50.0000 mg | Freq: Four times a day (QID) | INTRAMUSCULAR | Status: DC | PRN
  Filled 2011-02-24: qty 1

## 2011-02-24 MED ORDER — CITRIC ACID-SODIUM CITRATE 334-500 MG/5ML PO SOLN
30.0000 mL | ORAL | Status: DC | PRN
  Administered 2011-02-24: 30 mL via ORAL
  Filled 2011-02-24: qty 15

## 2011-02-24 MED ORDER — ONDANSETRON HCL 4 MG/2ML IJ SOLN: 4.0000 mg | Freq: Four times a day (QID) | INTRAMUSCULAR | Status: DC | PRN

## 2011-02-24 MED ORDER — HEPARIN SODIUM (PORCINE) 5000 UNIT/ML IJ SOLN
5000.0000 [IU] | Freq: Three times a day (TID) | INTRAMUSCULAR | Status: DC
  Filled 2011-02-24 (×4): qty 1

## 2011-02-24 MED ORDER — OXYTOCIN BOLUS FROM INFUSION
500.0000 mL | Freq: Once | INTRAVENOUS | Status: DC
  Filled 2011-02-24: qty 500

## 2011-02-24 MED ORDER — ONDANSETRON HCL 4 MG/2ML IJ SOLN: 4.0000 mg | Freq: Three times a day (TID) | INTRAMUSCULAR | Status: DC | PRN

## 2011-02-24 MED ORDER — PRENATAL MULTIVITAMIN CH
1.0000 | ORAL_TABLET | Freq: Every day | ORAL | Status: DC
  Administered 2011-02-25 – 2011-02-28 (×4): 1 via ORAL
  Filled 2011-02-24 (×5): qty 1

## 2011-02-24 MED ORDER — NALBUPHINE HCL 10 MG/ML IJ SOLN: 10.0000 mg | Freq: Four times a day (QID) | INTRAMUSCULAR | Status: DC | PRN

## 2011-02-24 MED ORDER — LANOLIN HYDROUS EX OINT: 1.0000 "application " | TOPICAL_OINTMENT | CUTANEOUS | Status: DC | PRN

## 2011-02-24 MED ORDER — OXYCODONE-ACETAMINOPHEN 5-325 MG PO TABS: 2.0000 | ORAL_TABLET | ORAL | Status: DC | PRN

## 2011-02-24 MED ORDER — LIDOCAINE-EPINEPHRINE (PF) 2 %-1:200000 IJ SOLN
INTRAMUSCULAR | Status: AC
  Filled 2011-02-24: qty 20

## 2011-02-24 MED ORDER — OXYTOCIN 20 UNITS IN LACTATED RINGERS INFUSION - SIMPLE: 125.0000 mL/h | Freq: Once | INTRAVENOUS | Status: DC

## 2011-02-24 MED ORDER — DIPHENHYDRAMINE HCL 25 MG PO CAPS
25.0000 mg | ORAL_CAPSULE | ORAL | Status: DC | PRN
  Administered 2011-02-25: 25 mg via ORAL
  Filled 2011-02-24 (×2): qty 1

## 2011-02-24 MED ORDER — SCOPOLAMINE 1 MG/3DAYS TD PT72
MEDICATED_PATCH | TRANSDERMAL | Status: AC
  Filled 2011-02-24: qty 1

## 2011-02-24 MED ORDER — 0.9 % SODIUM CHLORIDE (POUR BTL) OPTIME
TOPICAL | Status: DC | PRN
  Administered 2011-02-24: 1000 mL

## 2011-02-24 MED ORDER — MEPERIDINE HCL 25 MG/ML IJ SOLN: 6.2500 mg | INTRAMUSCULAR | Status: DC | PRN

## 2011-02-24 MED ORDER — DIPHENHYDRAMINE HCL 25 MG PO CAPS
25.0000 mg | ORAL_CAPSULE | Freq: Four times a day (QID) | ORAL | Status: DC | PRN
  Administered 2011-02-25: 25 mg via ORAL
  Filled 2011-02-24 (×2): qty 1

## 2011-02-24 MED ORDER — IBUPROFEN 600 MG PO TABS
600.0000 mg | ORAL_TABLET | Freq: Four times a day (QID) | ORAL | Status: DC
  Administered 2011-02-24 – 2011-02-28 (×14): 600 mg via ORAL
  Filled 2011-02-24 (×14): qty 1

## 2011-02-24 MED ORDER — LACTATED RINGERS IV SOLN
500.0000 mL | Freq: Once | INTRAVENOUS | Status: AC
  Administered 2011-02-24: 16:00:00 via INTRAVENOUS
  Administered 2011-02-24: 500 mL via INTRAVENOUS

## 2011-02-24 MED ORDER — WITCH HAZEL-GLYCERIN EX PADS: 1.0000 "application " | MEDICATED_PAD | CUTANEOUS | Status: DC | PRN

## 2011-02-24 MED ORDER — PHENYLEPHRINE 40 MCG/ML (10ML) SYRINGE FOR IV PUSH (FOR BLOOD PRESSURE SUPPORT): 80.0000 ug | PREFILLED_SYRINGE | INTRAVENOUS | Status: DC | PRN

## 2011-02-24 MED ORDER — PENICILLIN G POTASSIUM 5000000 UNITS IJ SOLR
5.0000 10*6.[IU] | Freq: Once | INTRAVENOUS | Status: DC
  Filled 2011-02-24: qty 5

## 2011-02-24 MED ORDER — NALBUPHINE SYRINGE 5 MG/0.5 ML: 10.0000 mg | INJECTION | INTRAMUSCULAR | Status: DC | PRN

## 2011-02-24 MED ORDER — FENTANYL 2.5 MCG/ML BUPIVACAINE 1/10 % EPIDURAL INFUSION (WH - ANES)
INTRAMUSCULAR | Status: DC | PRN
  Administered 2011-02-24: 14 mL/h via EPIDURAL

## 2011-02-24 MED ORDER — LACTATED RINGERS IV SOLN
INTRAVENOUS | Status: DC
  Administered 2011-02-25: 01:00:00 via INTRAVENOUS

## 2011-02-24 MED ORDER — EPHEDRINE 5 MG/ML INJ
10.0000 mg | INTRAVENOUS | Status: DC | PRN
  Administered 2011-02-24: 10 mg via INTRAVENOUS
  Filled 2011-02-24: qty 4

## 2011-02-24 MED ORDER — MORPHINE SULFATE 0.5 MG/ML IJ SOLN
INTRAMUSCULAR | Status: AC
  Filled 2011-02-24: qty 10

## 2011-02-24 MED ORDER — DIBUCAINE 1 % RE OINT
1.0000 "application " | TOPICAL_OINTMENT | RECTAL | Status: DC | PRN
  Filled 2011-02-24: qty 28

## 2011-02-24 MED ORDER — SODIUM BICARBONATE 8.4 % IV SOLN
INTRAVENOUS | Status: DC | PRN
  Administered 2011-02-24: 5 mL via EPIDURAL

## 2011-02-24 MED ORDER — OXYTOCIN 10 UNIT/ML IJ SOLN
INTRAMUSCULAR | Status: DC | PRN
  Administered 2011-02-24: 20 [IU] via INTRAMUSCULAR

## 2011-02-24 MED ORDER — ACETAMINOPHEN 325 MG PO TABS: 650.0000 mg | ORAL_TABLET | ORAL | Status: DC | PRN

## 2011-02-24 MED ORDER — METHYLERGONOVINE MALEATE 0.2 MG PO TABS: 0.2000 mg | ORAL_TABLET | ORAL | Status: DC | PRN

## 2011-02-24 MED ORDER — SIMETHICONE 80 MG PO CHEW
80.0000 mg | CHEWABLE_TABLET | ORAL | Status: DC | PRN
  Administered 2011-02-26: 80 mg via ORAL

## 2011-02-24 MED ORDER — FENTANYL 2.5 MCG/ML BUPIVACAINE 1/10 % EPIDURAL INFUSION (WH - ANES)
14.0000 mL/h | INTRAMUSCULAR | Status: DC
  Administered 2011-02-24: 14 mL/h via EPIDURAL
  Filled 2011-02-24 (×2): qty 60

## 2011-02-24 MED ORDER — NALBUPHINE HCL 10 MG/ML IJ SOLN
5.0000 mg | INTRAMUSCULAR | Status: DC | PRN
  Administered 2011-02-24 – 2011-02-25 (×2): 5 mg via INTRAVENOUS
  Filled 2011-02-24: qty 1

## 2011-02-24 MED ORDER — DIPHENHYDRAMINE HCL 50 MG/ML IJ SOLN: 25.0000 mg | INTRAMUSCULAR | Status: DC | PRN

## 2011-02-24 MED ORDER — ONDANSETRON HCL 4 MG/2ML IJ SOLN: 4.0000 mg | INTRAMUSCULAR | Status: DC | PRN

## 2011-02-24 MED ORDER — LIDOCAINE HCL (PF) 1 % IJ SOLN: 30.0000 mL | INTRAMUSCULAR | Status: DC | PRN

## 2011-02-24 MED ORDER — OXYTOCIN 10 UNIT/ML IJ SOLN
INTRAMUSCULAR | Status: AC
  Filled 2011-02-24: qty 2

## 2011-02-24 MED ORDER — ZOLPIDEM TARTRATE 5 MG PO TABS: 5.0000 mg | ORAL_TABLET | Freq: Every evening | ORAL | Status: DC | PRN

## 2011-02-24 SURGICAL SUPPLY — 39 items
ADH SKN CLS APL DERMABOND .7 (GAUZE/BANDAGES/DRESSINGS) ×1
CANISTER WOUND CARE 500ML ATS (WOUND CARE) IMPLANT
CHLORAPREP W/TINT 26ML (MISCELLANEOUS) ×2 IMPLANT
CLOTH BEACON ORANGE TIMEOUT ST (SAFETY) ×2 IMPLANT
CONTAINER PREFILL 10% NBF 15ML (MISCELLANEOUS) ×2 IMPLANT
DERMABOND ADVANCED (GAUZE/BANDAGES/DRESSINGS) ×1
DERMABOND ADVANCED .7 DNX12 (GAUZE/BANDAGES/DRESSINGS) ×1 IMPLANT
DRSG VAC ATS LRG SENSATRAC (GAUZE/BANDAGES/DRESSINGS) IMPLANT
DRSG VAC ATS MED SENSATRAC (GAUZE/BANDAGES/DRESSINGS) IMPLANT
DRSG VAC ATS SM SENSATRAC (GAUZE/BANDAGES/DRESSINGS) IMPLANT
ELECT REM PT RETURN 9FT ADLT (ELECTROSURGICAL) ×2
ELECTRODE REM PT RTRN 9FT ADLT (ELECTROSURGICAL) ×1 IMPLANT
EXTRACTOR VACUUM M CUP 4 TUBE (SUCTIONS) ×1 IMPLANT
GLOVE BIO SURGEON STRL SZ8 (GLOVE) ×4 IMPLANT
GOWN PREVENTION PLUS LG XLONG (DISPOSABLE) ×4 IMPLANT
GOWN PREVENTION PLUS XLARGE (GOWN DISPOSABLE) ×2 IMPLANT
KIT ABG SYR 3ML LUER SLIP (SYRINGE) ×1 IMPLANT
NDL HYPO 25X5/8 SAFETYGLIDE (NEEDLE) ×1 IMPLANT
NEEDLE HYPO 25X5/8 SAFETYGLIDE (NEEDLE) ×2 IMPLANT
NS IRRIG 1000ML POUR BTL (IV SOLUTION) ×2 IMPLANT
PACK C SECTION WH (CUSTOM PROCEDURE TRAY) ×2 IMPLANT
RTRCTR C-SECT PINK 25CM LRG (MISCELLANEOUS) IMPLANT
SLEEVE SCD COMPRESS KNEE MED (MISCELLANEOUS) ×1 IMPLANT
STAPLER VISISTAT 35W (STAPLE) ×1 IMPLANT
SUT GUT PLAIN 0 CT-3 TAN 27 (SUTURE) ×2 IMPLANT
SUT MNCRL 0 VIOLET CTX 36 (SUTURE) ×3 IMPLANT
SUT MNCRL AB 4-0 PS2 18 (SUTURE) IMPLANT
SUT MON AB 2-0 CT1 27 (SUTURE) ×2 IMPLANT
SUT MON AB 3-0 SH 27 (SUTURE)
SUT MON AB 3-0 SH27 (SUTURE) IMPLANT
SUT MONOCRYL 0 CTX 36 (SUTURE) ×3
SUT PLAIN 2 0 XLH (SUTURE) IMPLANT
SUT VIC AB 0 CTX 36 (SUTURE) ×4
SUT VIC AB 0 CTX36XBRD ANBCTRL (SUTURE) ×2 IMPLANT
SUT VIC AB 2-0 CT1 27 (SUTURE)
SUT VIC AB 2-0 CT1 TAPERPNT 27 (SUTURE) IMPLANT
TOWEL OR 17X24 6PK STRL BLUE (TOWEL DISPOSABLE) ×4 IMPLANT
TRAY FOLEY CATH 14FR (SET/KITS/TRAYS/PACK) ×1 IMPLANT
WATER STERILE IRR 1000ML POUR (IV SOLUTION) ×1 IMPLANT

## 2011-02-24 NOTE — ED Notes (Signed)
JSpurlock-Frizzell, RN spoke to [employer] with report of pt. Will go to room 163

## 2011-02-24 NOTE — Progress Notes (Signed)
[name] is [name] 31 y.o. [phone] at [redacted]w[redacted]d by LMP admitted for active labor  Subjective:   Objective: BP 134/94  Pulse 88  Temp(Src) 98 F (36.7 C) (Oral)  Resp 22      FHT:  FHR: 150 bpm, variability: moderate,  accelerations:  Present,  decelerations:  Absent UC:   regular, every 3-4 minutes SVE:   Dilation: 5 Effacement (%): 50 Station: -3 Exam by:: SBeck, RN/JLowe, RN  Labs: Lab Results  Component Value Date   WBC 11.3* 02/24/2011   HGB 10.4* 02/24/2011   HCT 32.4* 02/24/2011   MCV 71.8* 02/24/2011   PLT 287 02/24/2011    Assessment / Plan: Spontaneous labor, progressing normally  Labor: Progressing normally Preeclampsia:  n/[name] Fetal Wellbeing:  Category I Pain Control:  Nubain/Vistaril.  Epidural upon request. I/D:  n/[name] Anticipated MOD:  NSVD  [name] 02/24/2011, 8:51 AM

## 2011-02-24 NOTE — Progress Notes (Signed)
Ctx's since 0600 this am, denies bleeding or lof. +FM.

## 2011-02-24 NOTE — H&P (Signed)
[name] is a 31 y.o. female presenting for uterine contractions, early labor. Maternal Medical History:  Reason for admission: Reason for admission: contractions.  Contractions: Onset was 6-12 hours ago.   Frequency: regular.   Duration is approximately 1 minute.   Perceived severity is strong.    Fetal activity: Perceived fetal activity is normal.   Last perceived fetal movement was within the past hour.    Prenatal complications: no prenatal complications   OB History    Grav Para Term Preterm Abortions TAB SAB Ect Mult Living   6 2 2  0 3 1 2  0 0 1     Past Medical History  Diagnosis Date  . No pertinent past medical history   . 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 Heart murmur    Past Surgical History  Procedure Date  . No past surgeries   . Breast surgery 2005    L breast infection after loss   Family History: family history includes Cancer in her maternal grandfather and Diabetes in her maternal aunt. Social History:  reports that she has never smoked. She has never used smokeless tobacco. She reports that she does not drink alcohol or use illicit drugs.  Review of Systems  All other systems reviewed and are negative.    Dilation: 5 Effacement (%): 50 Station: -3 Exam by:: SBeck, RN/JLowe, RN Blood pressure 136/78, pulse 102, temperature 97.7 F (36.5 C), temperature source Oral, resp. rate 16. Maternal Exam:  Uterine Assessment: Contraction strength is firm.  Contraction duration is 1 minute. Abdomen: Patient reports no abdominal tenderness. Fetal presentation: vertex  Introitus: Normal vulva. Normal vagina.    Physical Exam  Nursing note and vitals reviewed. Constitutional: She is oriented to person, place, and time. She appears well-developed and well-nourished.  HENT:  Head: Normocephalic and atraumatic.  Neck: Normal range of motion. Neck supple.  Cardiovascular: Normal rate and regular rhythm.   Respiratory: Effort normal and breath sounds  normal.  GI: Soft.  Genitourinary: Vagina normal and uterus normal.  Musculoskeletal: Normal range of motion.  Neurological: She is alert and oriented to person, place, and time.  Skin: Skin is warm and dry.  Psychiatric: She has a normal mood and affect. Her behavior is normal. Judgment and thought content normal.    Prenatal labs: ABO, Rh: --/--/B POS (06/02 1911) Antibody: Negative (07/20 0000) Rubella: Immune (07/20 0000) RPR: Nonreactive (07/20 0000)  HBsAg: Negative (07/20 0000)  HIV: Non-reactive (07/20 0000)  GBS: Positive (12/28 0000)   Assessment/Plan: Term pregnancy.  Early labor.   [name] A 02/24/2011, 8:17 AM

## 2011-02-24 NOTE — Progress Notes (Signed)
[name] is a 31 y.o. [phone] at [redacted]w[redacted]d by LMP admitted for active labor  Subjective: Pt comfortable with epidural.  Objective: BP 109/65  Pulse 81  Temp(Src) 98 F (36.7 C) (Oral)  Resp 20  Ht 5\' 6"  (1.676 m)  Wt 117.028 kg (258 lb)  BMI 41.64 kg/m2  SpO2 100%      FHT:  FHR: 150 bpm, variability: moderate,  accelerations:  Present,  decelerations:  Present Occasional variable with contraction.  UC:   irregular, every 4-10 minutes, Pitocin at 4mu SVE:   Dilation: 7 Effacement (%): 100 Station: -1 Exam by:: hamby, cnm  Labs: Lab Results  Component Value Date   WBC 11.3* 02/24/2011   HGB 10.4* 02/24/2011   HCT 32.4* 02/24/2011   MCV 71.8* 02/24/2011   PLT 287 02/24/2011    Assessment / Plan: Protracted active phase, Pitocin. Consulted with Dr. Clearance Coots.   Labor: On pitocin Preeclampsia:  no signs or symptoms of toxicity Fetal Wellbeing:  Category II Pain Control:  Epidural I/D:  n/a Anticipated MOD:  NSVD  HAMBY, Skylen Danielsen 02/24/2011, 12:34 PM

## 2011-02-24 NOTE — Anesthesia Postprocedure Evaluation (Signed)
  Anesthesia Post-op Note  Patient: [name]  Procedure(s) Performed:  CESAREAN SECTION - Primary, cord ph 7.29   Patient is awake, responsive, moving her legs, and has signs of resolution of her numbness. Pain and nausea are reasonably well controlled. Vital signs are stable and clinically acceptable. Oxygen saturation is clinically acceptable. There are no apparent anesthetic complications at this time. Patient is ready for discharge.

## 2011-02-24 NOTE — Anesthesia Preprocedure Evaluation (Addendum)
Anesthesia Evaluation  Patient identified by MRN, date of birth, ID band Patient awake    Reviewed: Allergy & Precautions, H&P , Patient's Chart, lab work & pertinent test results  Airway Mallampati: II TM Distance: >3 FB Neck ROM: full    Dental No notable dental hx.    Pulmonary  clear to auscultation  Pulmonary exam normal       Cardiovascular Exercise Tolerance: Good regular Normal    Neuro/Psych    GI/Hepatic   Endo/Other  Morbid obesity  Renal/GU      Musculoskeletal   Abdominal   Peds  Hematology   Anesthesia Other Findings   Reproductive/Obstetrics                          Anesthesia Physical Anesthesia Plan  ASA: III  Anesthesia Plan: Epidural   Post-op Pain Management:    Induction:   Airway Management Planned:   Additional Equipment:   Intra-op Plan:   Post-operative Plan:   Informed Consent: I have reviewed the patients History and Physical, chart, labs and discussed the procedure including the risks, benefits and alternatives for the proposed anesthesia with the patient or authorized representative who has indicated his/her understanding and acceptance.   Dental Advisory Given  Plan Discussed with:   Anesthesia Plan Comments: (Labs checked- platelets confirmed with RN in room. Fetal heart tracing, per RN, reported to be stable enough for sitting procedure. Discussed epidural, and patient consents to the procedure:  included risk of possible headache,backache, failed block, allergic reaction, and nerve injury. This patient was asked if she had any questions or concerns before the procedure started. )       Anesthesia Quick Evaluation

## 2011-02-24 NOTE — OR Nursing (Signed)
Sent for patient.  Spoke with [name] Dennison

## 2011-02-24 NOTE — ED Notes (Signed)
IV attempt x 2 with no success.

## 2011-02-24 NOTE — Consult Note (Signed)
Neonatology Note:  Attendance at C-section:  I was asked to attend this primary C/S at term due to possible occult cord with FHR decelerations. The mother is a G6P2A3L1 B pos, GBS pos. ROM 5 hours prior to delivery, fluid clear. Vacuum-assisted delivery. Infant vigorous with good spontaneous cry and tone. Needed only minimal bulb suctioning. Ap 9/9. Lungs clear to ausc in DR. A 1 cm superficial laceration was noted at the left eyebrow area, discussed with mother and grandmother. A 2-3 mm cafe au lait spot was noted on the left arm, also. To CN to care of Pediatrician.  Deran Barro, MD  

## 2011-02-24 NOTE — Progress Notes (Signed)
Pt brought directly to rm from lobby via wc

## 2011-02-24 NOTE — Transfer of Care (Signed)
Immediate Anesthesia Transfer of Care Note  Patient: [name]  Procedure(s) Performed:  CESAREAN SECTION - Primary, cord ph 7.29  Patient Location: PACU  Anesthesia Type: Epidural  Level of Consciousness: awake  Airway & Oxygen Therapy: Patient Spontanous Breathing  Post-op Assessment: Report given to PACU RN  Post vital signs: Reviewed and stable  Complications: No apparent anesthesia complications

## 2011-02-24 NOTE — Anesthesia Procedure Notes (Signed)
Epidural Patient location during procedure: OB  Preanesthetic Checklist Completed: patient identified, site marked, surgical consent, pre-op evaluation, timeout performed, IV checked, risks and benefits discussed and monitors and equipment checked  Epidural Patient position: sitting Prep: site prepped and draped and DuraPrep Patient monitoring: continuous pulse ox and blood pressure Approach: midline Injection technique: LOR air  Needle:  Needle type: Tuohy  Needle gauge: 17 G Needle length: 9 cm Needle insertion depth: 7 cm Catheter type: closed end flexible Catheter size: 19 Gauge Catheter at skin depth: 14 cm Test dose: negative  Assessment Events: blood not aspirated, injection not painful, no injection resistance, negative IV test and no paresthesia  Additional Notes Dosing of Epidural:  1st dose, through needle ............................................. epi 1:200K + Xylocaine 40 mg  2nd dose, through catheter, after waiting 3 minutes.....epi 1:200K + Xylocaine 60 mg  3rd dose, through catheter after waiting 3 minutes .............................Marcaine   5mg   ( mg Marcaine are expressed as equivilent  cc's medication removed from the 0.1%Bupiv / fentanyl syringe from L&D pump)  ( 2% Xylo charted as a single dose in Epic Meds for ease of charting; actual dosing was fractionated as above, for saftey's sake)  As each dose occurred, patient was free of IV sx; and patient exhibited no evidence of SA injection.  Patient is more comfortable after epidural dosed. Please see RN's note for documentation of vital signs,and FHR which are stable.       

## 2011-02-24 NOTE — Progress Notes (Signed)
[name] is a 31 y.o. [phone] at [redacted]w[redacted]d by LMP admitted for active labor  Subjective:   Objective: BP 132/71  Pulse 100  Temp(Src) 98.2 F (36.8 C) (Oral)  Resp 18  Ht 5\' 6"  (1.676 m)  Wt 117.028 kg (258 lb)  BMI 41.64 kg/m2  SpO2 100%      FHT:    Repetitive decels.  Pitocin off. UC:   regular, every 1-2 minutes SVE:   6cm/70%/-2/vertex/palpable umbilical cord  Labs: Lab Results  Component Value Date   WBC 11.3* 02/24/2011   HGB 10.4* 02/24/2011   HCT 32.4* 02/24/2011   MCV 71.8* 02/24/2011   PLT 287 02/24/2011    Assessment / Plan: Cord prolapse.  Will proceed with C/S.  Labor: Slow progress.  Probable polyhydramnios. Preeclampsia:  n/a Fetal Wellbeing:  Category II Pain Control:  Epidural I/D:  n/a Anticipated MOD:  C/S  HARPER,CHARLES A 02/24/2011, 3:56 PM

## 2011-02-24 NOTE — Progress Notes (Signed)
[name] is a 31 y.o. [phone] at [redacted]w[redacted]d by LMP admitted for active labor  Subjective: Pt comfortable after epidural.   Objective: BP 121/55  Pulse 78  Temp(Src) 98 F (36.7 C) (Oral)  Resp 20  Ht 5\' 6"  (1.676 m)  Wt 117.028 kg (258 lb)  BMI 41.64 kg/m2  SpO2 100%      FHT:  FHR: 150 bpm, variability: moderate,  accelerations:  Present,  decelerations:  Present prolonged decel noted after epidural placement, now reassuring with no decels.  UC:   irregular, every 4-10 minutes SVE:   Dilation: 7 Effacement (%): 100 Station: -1 Exam by:: R Hamby CNM  Labs: Lab Results  Component Value Date   WBC 11.3* 02/24/2011   HGB 10.4* 02/24/2011   HCT 32.4* 02/24/2011   MCV 71.8* 02/24/2011   PLT 287 02/24/2011    Assessment / Plan: Spontaneous labor, progressing normally, inadequate contractions after epidural, will start Pitocin. AROM with head well applied with large bulging bag. Copious amount of clear fluid. EFW 8lbs  Labor: Progressing, AROM and then Pitocin if inadquate contractions. Preeclampsia:  no signs or symptoms of toxicity Fetal Wellbeing:  Category II Pain Control:  Epidural I/D:  n/a Anticipated MOD:  NSVD  HAMBY, REBECCA 02/24/2011, 11:08 AM

## 2011-02-24 NOTE — ED Notes (Signed)
IV attempted x2, unable.  Labs drawn . Pt transferred to 163 via wc

## 2011-02-25 LAB — CBC
HCT: 23.6 % — ABNORMAL LOW (ref 36.0–46.0)
Hemoglobin: 7.7 g/dL — ABNORMAL LOW (ref 12.0–15.0)
MCHC: 32.6 g/dL (ref 30.0–36.0)

## 2011-02-25 MED ORDER — OXYCODONE-ACETAMINOPHEN 5-325 MG PO TABS
1.0000 | ORAL_TABLET | ORAL | Status: DC | PRN
  Administered 2011-02-25 – 2011-02-26 (×5): 2 via ORAL
  Administered 2011-02-26 (×2): 1 via ORAL
  Administered 2011-02-26 – 2011-02-27 (×4): 2 via ORAL
  Filled 2011-02-25 (×9): qty 2
  Filled 2011-02-25 (×2): qty 1
  Filled 2011-02-25: qty 2

## 2011-02-25 NOTE — Anesthesia Postprocedure Evaluation (Signed)
  Anesthesia Post-op Note  Patient: [name]  Procedure(s) Performed:  CESAREAN SECTION - Primary, cord ph 7.29  Patient Location: Mother/Baby  Anesthesia Type: Epidural  Level of Consciousness: alert  and oriented  Airway and Oxygen Therapy: Patient Spontanous Breathing  Post-op Pain: mild  Post-op Assessment: Patient's Cardiovascular Status Stable and Respiratory Function Stable  Post-op Vital Signs: stable  Complications: No apparent anesthesia complications

## 2011-02-25 NOTE — Anesthesia Postprocedure Evaluation (Signed)
  Anesthesia Post Note  Patient: [name]  Procedure(s) Performed:  CESAREAN SECTION - Primary, cord ph 7.29  Anesthesia type: Epidural  Patient location: Mother/Baby  Post pain: Pain level controlled  Post assessment: Post-op Vital signs reviewed  Last Vitals:  Filed Vitals:   02/25/11 0600  BP: 113/70  Pulse: 83  Temp: 36.4 C  Resp: 18    Post vital signs: Reviewed  Level of consciousness:alert  Complications: No apparent anesthesia complications
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
## 2011-02-25 NOTE — Progress Notes (Signed)
Encouraged the father not to sleep with baby on couch, father continue to do so. Well continue to monitor.

## 2011-02-25 NOTE — Addendum Note (Signed)
Addendum  created 02/25/11 0930 by Cephus Shelling, CRNA   Modules edited:Notes Section

## 2011-02-25 NOTE — Addendum Note (Signed)
Addendum  created 02/25/11 1135 by Fanny Dance, CRNA   Modules edited:Charges VN, Notes Section

## 2011-02-25 NOTE — Progress Notes (Signed)
Subjective: Postpartum Day 1: Cesarean Delivery Patient reports incisional pain.  Pruritis.  Objective: Vital signs in last 24 hours: Temp:  [97.1 F (36.2 C)-99.1 F (37.3 C)] 98.9 F (37.2 C) (01/25 0400) Pulse Rate:  [75-120] 86  (01/25 0400) Resp:  [16-24] 18  (01/25 0400) BP: (86-159)/(45-102) 107/58 mmHg (01/25 0400) SpO2:  [95 %-100 %] 98 % (01/25 0400) Weight:  [117.028 kg (258 lb)] 117.028 kg (258 lb) (01/24 0817)  Physical Exam:  General: alert and no distress Lochia: appropriate Uterine Fundus: firm Incision: healing well DVT Evaluation: No evidence of DVT seen on physical exam.   Basename 02/25/11 0525 02/24/11 1930  HGB 7.7* 8.9*  HCT 23.6* 27.4*    Assessment/Plan: Status post Cesarean section. Doing well postoperatively.   Anemia.  Stable clinically. Continue current care.  [name] A 02/25/2011, 6:01 AM

## 2011-02-25 NOTE — Progress Notes (Signed)
UR Chart review completed.  

## 2011-02-25 NOTE — Progress Notes (Signed)
Encouraged the father to not sleep with the baby on the couch,  however he has repeatedly  stated that   he and the baby or OK. Will continue to monitor and report to on coming Shift.

## 2011-02-25 NOTE — Progress Notes (Signed)
Alton Revere MD in regards to heparin order, patient had some question in regards to  Medication order. Medication discontinued per MD  Request. Gillian Shields RN

## 2011-02-26 NOTE — Progress Notes (Signed)
Subjective: Postpartum Day 2: Cesarean Delivery Patient reports   No complaints.  Objective: Vital signs in last 24 hours: Temp:  [97.7 F (36.5 C)-98.1 F (36.7 C)] 97.7 F (36.5 C) (01/26 0356) Pulse Rate:  [80-107] 80  (01/26 0356) Resp:  [18-20] 18  (01/26 0356) BP: (113-129)/(66-77) 113/66 mmHg (01/26 0356) SpO2:  [96 %-98 %] 98 % (01/25 1800)  Physical Exam:  General: alert and no distress Lochia: appropriate Uterine Fundus: firm Incision: healing well DVT Evaluation: No evidence of DVT seen on physical exam.   Basename 02/25/11 0525 02/24/11 1930  HGB 7.7* 8.9*  HCT 23.6* 27.4*    Assessment/Plan: Status post Cesarean section. Doing well postoperatively.  Continue current care.  HARPER,CHARLES A 02/26/2011, 8:24 AM

## 2011-02-27 ENCOUNTER — Inpatient Hospital Stay (HOSPITAL_COMMUNITY): Payer: Medicaid Other

## 2011-02-27 ENCOUNTER — Ambulatory Visit (HOSPITAL_COMMUNITY)
Admit: 2011-02-27 | Discharge: 2011-02-27 | Disposition: A | Discharge status: Home / Self Care | Payer: Medicaid Other | Attending: Obstetrics | Admitting: Obstetrics

## 2011-02-27 ENCOUNTER — Ambulatory Visit (HOSPITAL_COMMUNITY): Payer: Medicaid Other

## 2011-02-27 ENCOUNTER — Other Ambulatory Visit (HOSPITAL_COMMUNITY): Payer: Medicaid Other

## 2011-02-27 ENCOUNTER — Ambulatory Visit (HOSPITAL_COMMUNITY): Admit: 2011-02-27 | Payer: Medicaid Other

## 2011-02-27 MED ORDER — DOCUSATE SODIUM 100 MG PO CAPS: 100.0000 mg | ORAL_CAPSULE | Freq: Two times a day (BID) | ORAL | Status: AC

## 2011-02-27 MED ORDER — LORAZEPAM 2 MG/ML IJ SOLN
1.0000 mg | Freq: Once | INTRAMUSCULAR | Status: AC
  Administered 2011-02-27: 1 mg via INTRAVENOUS

## 2011-02-27 MED ORDER — HYDROMORPHONE HCL 4 MG PO TABS: 4.0000 mg | ORAL_TABLET | Freq: Four times a day (QID) | ORAL | Status: DC | PRN

## 2011-02-27 MED ORDER — FERROUS SULFATE 300 (60 FE) MG/5ML PO SYRP: 300.0000 mg | ORAL_SOLUTION | Freq: Two times a day (BID) | ORAL | Status: DC

## 2011-02-27 MED ORDER — LORAZEPAM 1 MG PO TABS: 1.0000 mg | ORAL_TABLET | Freq: Once | ORAL | Status: DC

## 2011-02-27 MED ORDER — HYDROMORPHONE HCL 2 MG PO TABS
4.0000 mg | ORAL_TABLET | Freq: Four times a day (QID) | ORAL | Status: DC | PRN
Start: 2011-02-27 — End: 2011-02-28
  Administered 2011-02-27 – 2011-02-28 (×4): 4 mg via ORAL
  Filled 2011-02-27 (×4): qty 2

## 2011-02-27 MED ORDER — IBUPROFEN 600 MG PO TABS: 600.0000 mg | ORAL_TABLET | Freq: Four times a day (QID) | ORAL | Status: DC

## 2011-02-27 MED ORDER — BISACODYL 10 MG RE SUPP
10.0000 mg | Freq: Once | RECTAL | Status: AC
  Administered 2011-02-27: 10 mg via RECTAL
  Filled 2011-02-27: qty 1

## 2011-02-27 NOTE — Discharge Summary (Signed)
Obstetric Discharge Summary Reason for Admission: onset of labor Prenatal Procedures: ultrasound Intrapartum Procedures: cesarean: low cervical, transverse Postpartum Procedures: none Complications-Operative and Postpartum: none Hemoglobin  Date Value Range Status  02/25/2011 7.7* 12.0-15.0 (g/dL) Final     HCT  Date Value Range Status  02/25/2011 23.6* 36.0-46.0 (%) Final    Discharge Diagnoses: Term Pregnancy-delivered  Discharge Information: Date: 02/27/2011 Activity: pelvic rest Diet: routine Medications: PNV, Ibuprofen, Colace, Iron and Dilaudid Condition: stable Instructions: refer to practice specific booklet Discharge to: home Follow-up Information    Schedule an appointment as soon as possible for a visit with Kathreen Cosier, MD.   Contact information:   [address] [place] [address] [phone]          Newborn Data: Live born female  Birth Weight: 7 lb 9 oz (3430 g) APGAR: 9, 9  Home with mother.  HARPER,CHARLES A 02/27/2011, 9:40 AM

## 2011-02-27 NOTE — Progress Notes (Signed)
Subjective: Postpartum Day 3: Cesarean Delivery Patient reports incisional pain, tolerating PO and no problems voiding.    Objective: Vital signs in last 24 hours: Temp:  [97.8 F (36.6 C)-98.5 F (36.9 C)] 97.8 F (36.6 C) (01/27 0545) Pulse Rate:  [87-102] 96  (01/27 0545) Resp:  [18-20] 20  (01/27 0545) BP: (144-151)/(78-85) 149/84 mmHg (01/27 0545)  Physical Exam:  General: alert and no distress Lochia: appropriate Uterine Fundus: firm, nontender Incision: healing well DVT Evaluation: No evidence of DVT seen on physical exam.   Basename 02/25/11 0525 02/24/11 1930  HGB 7.7* 8.9*  HCT 23.6* 27.4*    Assessment/Plan: Status post Cesarean section. Doing well postoperatively.   Has increased generalized pain.  Exam is non focal for source of pain.  Will order abdominal binder for support.  Change pain meds.  Dulcolax suppository. Continue current care.  Probable discharge in pm.  HARPER,CHARLES A 02/27/2011, 9:10 AM

## 2011-02-27 NOTE — Progress Notes (Signed)
Pt complains of general pain in legs, back, incision. Unable to walk without severe pain. Pt encouraged to walk since yesterday to help but pt. Feels pain regimen not working so she cannot do basic acitvity. Will consult with MD about pain and bowel movement. PT has gas and VSS.

## 2011-02-27 NOTE — Progress Notes (Signed)
Pt sent to [facility] for MRI. Patient physiologically stable upon transfer. Pt c/o numbness, slight weakness in arm and in legs bilaterally. Pt states that she had back pain yesterday and it has progressed to it's current state. Pt. Also has bilateral sacral area pain normally 8-10/10 on scale with and without dilaudid PO PRN dose. Pt states that pain radiates to calf area. Pt able to ambulate but with much discomfort. Other than above stated symptoms and lower incision post c/s with preexisiting bilateral pedal edema, pt. Exam normal.  MDs aware and will wait on MRI results.

## 2011-02-27 NOTE — Consults (Signed)
CC: Back Pain and weakness  HPI:  S/P neuraxial anesthesia patient complains of new back pain.  The patient's history is inconsistent, but worrisome. She states that her pain and numbness do radiate down the legs .  She notes pain and tingling on her anterior medial and posterior thighs out laterally that radiates to the posterior calf area .  She states that she is weak in her bilateral lower extremities but also notes that she is weak in her bilateral upper extremities.  Patient does not complain of fever and the epidural site is not unnaturally tender.   There is point tenderness to palpation of her left sacroiliac region.  She has no bowel or bladder dysfunction does have some constipation.  She is currently able to ambulate but states that his cumbersome.  Her white blood cell count is 12.2.  The patient states that she pushed for 5 hours; however, having discussed her care with the obstetrician, this hisory is probably unreliable. She delivered her baby via C-section for cord prolapse.  ROS:  A twelve system review was performed and was negative except for that which was discussed in the history of present illness.  Physical Exam:  Gen.: Morbidly obese female in no acute distress VITAL: Afebrile Vital Signs stable SITE: clean and non-erythematous LUNGS: clear to ascultation bilaterally HEART: regular rate and rhythm   Assessment and Plan:  The patient's back pain symptoms are nonspecific and cannot be explained by any single explanation.   1) Paresthesias and numbness are common after pregnancy and childbirth.  I have spoken with the patient and, while I think that her symptoms do not represent dangerous neurological issues, I think the combination of radiculopathy and weakness does warrant further investigation.     2) I do not think her symptoms are emergent or rapidly changing, so I think that discharge from the hospital to receive an MRI is a reasonable option.  Other than her current  complaint, she is ready for discharge from [facility]  There is no MRI on site at this hospital.  3)  I spent time discussing the plan of care with the patient in entering her questions.  She knows that if she experiences new pain radiating down the legs,  bowel  or bladder dysfunction, numbness, motor weakness, severe pain, or fever,  we would recommend going to the emergency room and receiving an urgent consult with a neurologist, neurosurgeon, or orthopedic surgeon.  Thank you for the consult.  I spent 30 minutes on evaluation and management of this patient.  If any further questions arise please call [phone].

## 2011-02-27 NOTE — Progress Notes (Signed)
SW met with pt per RN, Laura, request.  RN stated she had concerns regarding FOB.  He was not present during SW visit.  Reviewed Tedra Slade, SW, note from 02/25/11.  Pt stated she felt safe and not in any danger for herself or the baby.  Plan is to discharge to her mother's home with baby.  She stated she had all of  her supplies and a good support system.  Pt was reluctant to discuss any more personal information.  Please contact SW as needed. Pericles Carmicheal, LCSW  

## 2011-02-28 ENCOUNTER — Other Ambulatory Visit (HOSPITAL_COMMUNITY): Payer: Medicaid Other

## 2011-02-28 ENCOUNTER — Encounter (HOSPITAL_COMMUNITY): Payer: Self-pay | Admitting: Obstetrics

## 2011-02-28 ENCOUNTER — Inpatient Hospital Stay (HOSPITAL_COMMUNITY): Admission: RE | Admit: 2011-02-28 | Payer: Medicaid Other | Source: Ambulatory Visit

## 2011-02-28 ENCOUNTER — Inpatient Hospital Stay (HOSPITAL_COMMUNITY): Admission: AD | Admit: 2011-02-28 | Payer: Self-pay | Source: Ambulatory Visit | Admitting: Obstetrics

## 2011-02-28 NOTE — Progress Notes (Signed)
UR chart review completed.  

## 2011-02-28 NOTE — Discharge Summary (Signed)
Obstetric Discharge Summary Reason for Admission: onset of labor Prenatal Procedures: none Intrapartum Procedures: cesarean: low cervical, transverse Postpartum Procedures: none Complications-Operative and Postpartum: none Hemoglobin  Date Value Range Status  02/25/2011 7.7* 12.0-15.0 (g/dL) Final     HCT  Date Value Range Status  02/25/2011 23.6* 36.0-46.0 (%) Final    Discharge Diagnoses: Term Pregnancy-delivered  Discharge Information: Date: 02/28/2011 Activity: pelvic rest Diet: routine Medications: Percocet Condition: stable Instructions: refer to practice specific booklet Discharge to: home Follow-up Information    Follow up with Staysha Truby A, MD. Schedule an appointment as soon as possible for a visit in 6 weeks.   Contact information:   [address] [place] [address] [phone]          Newborn Data: Live born female  Birth Weight: 7 lb 9 oz (3430 g) APGAR: 9, 9  Home with mother.  Alec Mcphee A 02/28/2011, 7:17 AM

## 2011-03-07 ENCOUNTER — Other Ambulatory Visit (HOSPITAL_COMMUNITY): Payer: Self-pay | Admitting: Obstetrics

## 2011-07-31 ENCOUNTER — Encounter (HOSPITAL_COMMUNITY): Payer: Self-pay

## 2011-07-31 ENCOUNTER — Emergency Department (INDEPENDENT_AMBULATORY_CARE_PROVIDER_SITE_OTHER)
Admission: EM | Admit: 2011-07-31 | Discharge: 2011-07-31 | Disposition: A | Discharge status: Home / Self Care | Payer: Medicaid Other | Source: Home / Self Care

## 2011-07-31 DIAGNOSIS — K625 Hemorrhage of anus and rectum: Secondary | ICD-10-CM

## 2011-07-31 LAB — OCCULT BLOOD, POC DEVICE: Fecal Occult Bld: NEGATIVE

## 2011-07-31 NOTE — ED Provider Notes (Signed)
Medical screening examination/treatment/procedure(s) were performed by non-physician practitioner and as supervising physician I was immediately available for consultation/collaboration.  Leslee Home, M.D.   Reuben Likes, MD 07/31/11 [phone]

## 2011-07-31 NOTE — Discharge Instructions (Signed)
Rectal Bleeding  Rectal bleeding is when blood passes out of the anus. It is usually a sign that something is wrong. It may not be serious, but it should always be evaluated. Rectal bleeding may present as bright red blood or extremely dark stools. The color may range from dark red or maroon to black (like tar). It is important that the cause of rectal bleeding be identified so treatment can be started and the problem corrected.  CAUSES    Hemorrhoids. These are enlarged (dilated) blood vessels or veins in the anal or rectal area.   Fistulas. Theseare abnormal, burrowing channels that usually run from inside the rectum to the skin around the anus. They can bleed.   Anal fissures. This is a tear in the tissue of the anus. Bleeding occurs with bowel movements.   Diverticulosis. This is a condition in which pockets or sacs project from the bowel wall. Occasionally, the sacs can bleed.   Diverticulitis. Thisis an infection involving diverticulosis of the colon.   Proctitis and colitis. These are conditions in which the rectum, colon, or both, can become inflamed and pitted (ulcerated).   Polyps and cancer. Polyps are non-cancerous (benign) growths in the colon that may bleed. Certain types of polyps turn into cancer.   Protrusion of the rectum. Part of the rectum can project from the anus and bleed.   Certain medicines.   Intestinal infections.   Blood vessel abnormalities.  HOME CARE INSTRUCTIONS   Eat a high-fiber diet to keep your stool soft.   Limit activity.   Drink enough fluids to keep your urine clear or pale yellow.   Warm baths may be useful to soothe rectal pain.   Follow up with your caregiver as directed.  SEEK IMMEDIATE MEDICAL CARE IF:   You develop increased bleeding.   You have black or dark red stools.   You vomit blood or material that looks like coffee grounds.   You have abdominal pain or tenderness.   You have a fever.   You feel weak, nauseous, or you faint.   You have  severe rectal pain or you are unable to have a bowel movement.  MAKE SURE YOU:   Understand these instructions.   Will watch your condition.   Will get help right away if you are not doing well or get worse.  Document Released: 07/09/2001 Document Revised: 01/06/2011 Document Reviewed: 07/04/2010  ExitCare Patient Information 2012 [employer].

## 2011-07-31 NOTE — ED Notes (Signed)
Pt had bright red bleeding when she wiped after straining with bowel movement this am.  She is breastfeeding.  Pt is taking mucinex and robitussin for sore throat.

## 2011-07-31 NOTE — ED Provider Notes (Signed)
History     CSN: [id number]  Arrival date & time 07/31/11  1442   None     Chief Complaint  Patient presents with  . Rectal Bleeding    (Consider location/radiation/quality/duration/timing/severity/associated sxs/prior treatment) Patient is a 31 y.o. female presenting with hematochezia. The history is provided by the patient.  Rectal Bleeding   Patient reports bright red blood from rectum noted yesterday after straining.  States moderate amount with toilet water red after the episode. Denies associated constipation or abdominal cramping, states no history of same, fh noncontributory.  Csection delivery five months ago, currently breast feeding, no LMP, denies anal sex.    -abdominal cramps, -bloating, -flatulence, -tenesmus, -anorexia, -nausea, -vomiting; denies myalgias, arthralgias, weight loss.    Past Medical History  Diagnosis Date  . 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 Heart murmur     Past Surgical History  Procedure Date  . No past surgeries   . Breast surgery 2005    L breast infection after loss  . Cesarean section 02/24/2011    Procedure: CESAREAN SECTION;  Surgeon: Brock Bad, MD;  Location: [facility];  Service: Gynecology;  Laterality: N/A;  Primary, cord ph 7.29    Family History  Problem Relation Age of Onset  . Diabetes Maternal Aunt   . Cancer Maternal Grandfather     lung    History  Substance Use Topics  . Smoking status: Never Smoker   . Smokeless tobacco: Never Used  . Alcohol Use: No    OB History    Grav Para Term Preterm Abortions TAB SAB Ect Mult Living   6 3 3  0 3 1 2  0 0 2      Review of Systems  Gastrointestinal: Positive for hematochezia.  All other systems reviewed and are negative.    Allergies  Codeine  Home Medications   Current Outpatient Rx  Name Route Sig Dispense Refill  . FERROUS SULFATE 300 (60 FE) MG/5ML PO SYRP Oral Take 5 mLs (300 mg total) by mouth 2 (two) times daily. 150 mL 3  . HYDROMORPHONE HCL 4 MG PO  TABS Oral Take 1 tablet (4 mg total) by mouth every 6 (six) hours as needed for pain. 40 tablet 0  . HYDROMORPHONE HCL 4 MG PO TABS Oral Take 1 tablet (4 mg total) by mouth every 6 (six) hours as needed. 40 tablet 0  . IBUPROFEN 600 MG PO TABS Oral Take 1 tablet (600 mg total) by mouth every 6 (six) hours. 30 tablet 5  . PRENATAL MULTIVITAMIN CH Oral Take 1 tablet by mouth daily.      BP 131/81  Pulse 84  Temp 99.5 F (37.5 C) (Oral)  Resp 19  SpO2 99%  Breastfeeding? Unknown  Physical Exam  Nursing note and vitals reviewed. Constitutional: She is oriented to person, place, and time. Vital signs are normal. She appears well-developed and well-nourished. She is active and cooperative.  HENT:  Head: Normocephalic.  Eyes: Conjunctivae are normal. Pupils are equal, round, and reactive to light. No scleral icterus.  Neck: Trachea normal. Neck supple.  Cardiovascular: Normal rate, regular rhythm, normal heart sounds and normal pulses.   Pulmonary/Chest: Effort normal and breath sounds normal.  Abdominal: Soft. Normal appearance and bowel sounds are normal. She exhibits no distension, no abdominal bruit, no ascites, no pulsatile midline mass and no mass. There is no tenderness. There is no rebound.  Genitourinary: Rectal exam shows no external hemorrhoid, no internal hemorrhoid,  no mass, no tenderness and anal tone normal. Guaiac negative stool.  Neurological: She is alert and oriented to person, place, and time. No cranial nerve deficit or sensory deficit. GCS eye subscore is 4. GCS verbal subscore is 5. GCS motor subscore is 6.  Skin: Skin is warm and dry.  Psychiatric: She has a normal mood and affect. Her speech is normal and behavior is normal. Judgment and thought content normal. Cognition and memory are normal.    ED Course  Procedures (including critical care time)  Labs Reviewed - No data to display No results found.   1. Bright red rectal bleeding       MDM  Hemoccult  negative for blood.  Begin fiber supplement (citrucel), increase fluid intake, if symptoms persist or worsen please RTC as discussed.  Follow up with GI physician PRN       Johnsie Kindred, NP 07/31/11 2047

## 2011-10-03 ENCOUNTER — Encounter (HOSPITAL_COMMUNITY): Payer: Self-pay

## 2011-10-03 ENCOUNTER — Emergency Department (HOSPITAL_COMMUNITY)
Admission: EM | Admit: 2011-10-03 | Discharge: 2011-10-03 | Disposition: A | Discharge status: Home / Self Care | Payer: Medicaid Other | Source: Home / Self Care

## 2011-10-03 DIAGNOSIS — J069 Acute upper respiratory infection, unspecified: Secondary | ICD-10-CM

## 2011-10-03 LAB — POCT RAPID STREP A: Streptococcus, Group A Screen (Direct): NEGATIVE

## 2011-10-03 LAB — INFLUENZA PANEL BY PCR (TYPE A & B)
H1N1 flu by pcr: NOT DETECTED
Influenza B By PCR: NEGATIVE

## 2011-10-03 MED ORDER — ACETAMINOPHEN 500 MG PO TABS: 500.0000 mg | ORAL_TABLET | Freq: Four times a day (QID) | ORAL | Status: AC | PRN

## 2011-10-03 MED ORDER — IBUPROFEN 600 MG PO TABS: 600.0000 mg | ORAL_TABLET | Freq: Four times a day (QID) | ORAL | Status: DC

## 2011-10-03 NOTE — ED Notes (Signed)
Mother reports productive cough of yellow sputum, chest congestion, runny nose, headaches  and sore throat.  Pt is breastfeeding.

## 2011-10-03 NOTE — ED Provider Notes (Signed)
History     CSN: [id number]  Arrival date & time 10/03/11  1413   First MD Initiated Contact with Patient 10/03/11 1605      Chief Complaint  Patient presents with  . Cough    (Consider location/radiation/quality/duration/timing/severity/associated sxs/prior treatment) HPI 31 year old African American female with no significant past medical history presents with cough with productive yellow sputum that started since 09/27/2011.  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  She presents with her child who also has similar complaints.  She reports having sinus congestion drainage with postnasal drip.  She also complains of right ear pain.  She denies fevers or chills at home.  Does complain of generalized malaise with body aches.  Past Medical History  Diagnosis Date  . 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 Heart murmur     Past Surgical History  Procedure Date  . Breast surgery 2005    L breast infection after loss  . Cesarean section 02/24/2011    Procedure: CESAREAN SECTION;  Surgeon: Brock Bad, MD;  Location: [facility];  Service: Gynecology;  Laterality: N/A;  Primary, cord ph 7.29    Family History  Problem Relation Age of Onset  . Diabetes Maternal Aunt   . Cancer Maternal Grandfather     lung    History  Substance Use Topics  . Smoking status: Never Smoker   . Smokeless tobacco: Never Used  . Alcohol Use: Yes    OB History    Grav Para Term Preterm Abortions TAB SAB Ect Mult Living   6 3 3  0 3 1 2  0 0 2      Review of Systems  Constitutional: Positive for fatigue.  HENT: Positive for ear pain, sore throat, postnasal drip and sinus pressure.   Eyes: Negative.   Respiratory: Positive for cough.   Cardiovascular: Negative.   Gastrointestinal: Negative.   Musculoskeletal: Negative.   Skin: Negative.   Neurological: Negative.   Hematological: Negative.   Psychiatric/Behavioral: Negative.     Allergies    Codeine  Home Medications   Current Outpatient Rx  Name Route Sig Dispense Refill  . ACETAMINOPHEN 500 MG PO TABS Oral Take 1 tablet (500 mg total) by mouth every 6 (six) hours as needed for pain. 30 tablet 0  . FERROUS SULFATE 300 (60 FE) MG/5ML PO SYRP Oral Take 5 mLs (300 mg total) by mouth 2 (two) times daily. 150 mL 3  . HYDROMORPHONE HCL 4 MG PO TABS Oral Take 1 tablet (4 mg total) by mouth every 6 (six) hours as needed for pain. 40 tablet 0  . HYDROMORPHONE HCL 4 MG PO TABS Oral Take 1 tablet (4 mg total) by mouth every 6 (six) hours as needed. 40 tablet 0  . IBUPROFEN 600 MG PO TABS Oral Take 1 tablet (600 mg total) by mouth every 6 (six) hours. 30 tablet 0  . PRENATAL MULTIVITAMIN CH Oral Take 1 tablet by mouth daily.      BP 120/78  Pulse 95  Temp 98.7 F (37.1 C) (Oral)  Resp 18  SpO2 98%  Breastfeeding? Yes  Physical Exam  Constitutional: She is oriented to person, place, and time. She appears well-developed and well-nourished.  HENT:  Head: Normocephalic and atraumatic.       Erythema noted in the nasopharynx bilaterally.  Some erythema noted in the oropharynx.  Shotty lymph nodes.  Tympanic membrane clear bilaterally.  Eyes: Conjunctivae  are normal. Pupils are equal, round, and reactive to light.  Neck: Normal range of motion. Neck supple.  Cardiovascular: Normal rate and regular rhythm.   Pulmonary/Chest: Effort normal and breath sounds normal.  Abdominal: Soft. Bowel sounds are normal.  Musculoskeletal: Normal range of motion.  Neurological: She is alert and oriented to person, place, and time.  Skin: Skin is warm and dry.  Psychiatric: She has a normal mood and affect.    ED Course  Procedures (including critical care time)   Labs Reviewed  POCT RAPID STREP A ([facility] ONLY)  POCT INFECTIOUS MONO SCREEN  INFLUENZA PANEL BY PCR   No results found.   1. Viral upper respiratory infection       MDM  Suspect patient likely has upper viral  respiratory infection as the patient works in a daycare setting with exposure to children with similar symptoms.  Rapid strep and mono negative.  Influenza PCR panel pending.  Instruct patient to use ibuprofen and Tylenol for discomfort.  May take over-the-counter nasal decongestants for symptom relief.  Also instructed the patient if her symptoms not improve she is to come back for further evaluation.        Cristal Ford, MD 10/03/11 [phone]

## 2012-05-04 ENCOUNTER — Emergency Department (HOSPITAL_COMMUNITY)
Admission: EM | Admit: 2012-05-04 | Discharge: 2012-05-05 | Disposition: A | Discharge status: Home / Self Care | Payer: Medicaid Other | Attending: Emergency Medicine | Admitting: Emergency Medicine

## 2012-05-04 ENCOUNTER — Encounter (HOSPITAL_COMMUNITY): Payer: Self-pay | Admitting: Emergency Medicine

## 2012-05-04 DIAGNOSIS — T50995A Adverse effect of other drugs, medicaments and biological substances, initial encounter: Secondary | ICD-10-CM | POA: Insufficient documentation

## 2012-05-04 DIAGNOSIS — F3289 Other specified depressive episodes: Secondary | ICD-10-CM | POA: Insufficient documentation

## 2012-05-04 DIAGNOSIS — T7840XA Allergy, unspecified, initial encounter: Secondary | ICD-10-CM

## 2012-05-04 DIAGNOSIS — R011 Cardiac murmur, unspecified: Secondary | ICD-10-CM | POA: Insufficient documentation

## 2012-05-04 DIAGNOSIS — R21 Rash and other nonspecific skin eruption: Secondary | ICD-10-CM | POA: Insufficient documentation

## 2012-05-04 DIAGNOSIS — R51 Headache: Secondary | ICD-10-CM | POA: Insufficient documentation

## 2012-05-04 DIAGNOSIS — F329 Major depressive disorder, single episode, unspecified: Secondary | ICD-10-CM | POA: Insufficient documentation

## 2012-05-04 MED ORDER — PROCHLORPERAZINE EDISYLATE 5 MG/ML IJ SOLN
10.0000 mg | Freq: Four times a day (QID) | INTRAMUSCULAR | Status: DC | PRN
  Administered 2012-05-05: 10 mg via INTRAVENOUS
  Filled 2012-05-04: qty 2

## 2012-05-04 MED ORDER — DIPHENHYDRAMINE HCL 50 MG/ML IJ SOLN
25.0000 mg | Freq: Once | INTRAMUSCULAR | Status: AC
  Administered 2012-05-05: 25 mg via INTRAVENOUS
  Filled 2012-05-04: qty 1

## 2012-05-04 MED ORDER — METHYLPREDNISOLONE SODIUM SUCC 125 MG IJ SOLR
125.0000 mg | Freq: Once | INTRAMUSCULAR | Status: AC
  Administered 2012-05-05: 125 mg via INTRAVENOUS
  Filled 2012-05-04: qty 2

## 2012-05-04 NOTE — ED Notes (Signed)
C/o generalized rash and headache x 2 weeks. States severe itching in vaginal area.  Pt reports having an abortion 3 weeks ago.

## 2012-05-05 ENCOUNTER — Emergency Department (HOSPITAL_COMMUNITY): Payer: Medicaid Other

## 2012-05-05 LAB — CBC WITH DIFFERENTIAL/PLATELET
Basophils Absolute: 0 10*3/uL (ref 0.0–0.1)
Eosinophils Absolute: 0.3 10*3/uL (ref 0.0–0.7)
Eosinophils Relative: 3 % (ref 0–5)
MCH: 24.3 pg — ABNORMAL LOW (ref 26.0–34.0)
MCHC: 34.2 g/dL (ref 30.0–36.0)
Monocytes Absolute: 0.5 10*3/uL (ref 0.1–1.0)
Neutrophils Relative %: 42 % — ABNORMAL LOW (ref 43–77)
Platelets: 304 10*3/uL (ref 150–400)
RBC: 4.15 MIL/uL (ref 3.87–5.11)
RDW: 14.2 % (ref 11.5–15.5)
Smear Review: ADEQUATE

## 2012-05-05 LAB — BASIC METABOLIC PANEL
Calcium: 9.2 mg/dL (ref 8.4–10.5)
Creatinine, Ser: 0.81 mg/dL (ref 0.50–1.10)
GFR calc Af Amer: >90 mL/min (ref >90)
GFR calc non Af Amer: >90 mL/min (ref >90)
Sodium: 140 mEq/L (ref 135–145)

## 2012-05-05 LAB — WET PREP, GENITAL
Trich, Wet Prep: NONE SEEN
Yeast Wet Prep HPF POC: NONE SEEN

## 2012-05-05 MED ORDER — PREDNISONE 20 MG PO TABS: 40.0000 mg | ORAL_TABLET | Freq: Every day | ORAL | Status: DC

## 2012-05-05 NOTE — ED Notes (Signed)
Pt returned from CT. States itchiness of skin has not improved.

## 2012-05-05 NOTE — ED Provider Notes (Addendum)
History     CSN: [id number]  Arrival date & time 05/04/12  2047   First MD Initiated Contact with Patient 05/04/12 2337      Chief Complaint  Patient presents with  . Rash    (Consider location/radiation/quality/duration/timing/severity/associated sxs/prior treatment) Patient is a 32 y.o. female presenting with rash. The history is provided by the patient.  Rash Location:  Full body Quality: itchiness   Severity:  Severe Onset quality:  Gradual Duration:  7 days Timing:  Constant Progression:  Worsening Chronicity:  New Context: medications   Context: not new detergent/soap   Context comment:  Patient had a therapeutic abortion 2 weeks ago and was given IM injection as well as a vaginal medication. She states the itching initially started in her vaginal area and has now moved to affect her whole body Relieved by:  Nothing Worsened by:  Nothing tried Ineffective treatments:  Antihistamines Associated symptoms: headaches   Associated symptoms: no abdominal pain, no diarrhea, no fever, no nausea, no shortness of breath, no sore throat, no throat swelling, no tongue swelling and not wheezing   Headaches:    Severity:  Moderate   Onset quality:  Gradual   Duration:  7 days   Timing:  Intermittent   Progression:  Waxing and waning   Chronicity:  New   Past Medical History  Diagnosis Date  . 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 Heart murmur   . Abortion     Past Surgical History  Procedure Laterality Date  . Breast surgery  2005    L breast infection after loss  . Cesarean section  02/24/2011    Procedure: CESAREAN SECTION;  Surgeon: Brock Bad, MD;  Location: [facility];  Service: Gynecology;  Laterality: N/A;  Primary, cord ph 7.29    Family History  Problem Relation Age of Onset  . Diabetes Maternal Aunt   . Cancer Maternal Grandfather     lung    History  Substance Use Topics  . Smoking status: Never Smoker   . Smokeless tobacco: Never Used  . Alcohol  Use: Yes    OB History   Grav Para Term Preterm Abortions TAB SAB Ect Mult Living   6 3 3  0 3 1 2  0 0 2      Review of Systems  Constitutional: Negative for fever.  HENT: Negative for sore throat.   Respiratory: Negative for shortness of breath and wheezing.   Gastrointestinal: Negative for nausea, abdominal pain and diarrhea.  Skin: Positive for rash.  Neurological: Positive for headaches.  All other systems reviewed and are negative.    Allergies  Codeine  Home Medications   Current Outpatient Rx  Name  Route  Sig  Dispense  Refill  . diphenhydrAMINE (BENADRYL) 25 mg capsule   Oral   Take 25 mg by mouth every 6 (six) hours as needed for itching or allergies.         Marland Kitchen ketoconazole (NIZORAL) 2 % cream   Topical   Apply 1 application topically 2 (two) times daily as needed (to affected area).           BP 117/73  Pulse 74  Temp(Src) 97.6 F (36.4 C) (Oral)  Resp 18  SpO2 100%  LMP 03/04/2012  Physical Exam  Nursing note and vitals reviewed. Constitutional: She is oriented to person, place, and time. She appears well-developed and well-nourished. No distress.  HENT:  Head: Normocephalic and atraumatic.  Mouth/Throat: Oropharynx is  clear and moist.  Eyes: Conjunctivae and EOM are normal. Pupils are equal, round, and reactive to light.  Neck: Normal range of motion. Neck supple.  Cardiovascular: Normal rate, regular rhythm and intact distal pulses.   No murmur heard. Pulmonary/Chest: Effort normal and breath sounds normal. No respiratory distress. She has no wheezes. She has no rales.  Abdominal: Soft. She exhibits no distension. There is no tenderness. There is no rebound and no guarding.  Genitourinary: Vagina normal and uterus normal. Cervix exhibits no motion tenderness, no discharge and no friability. Right adnexum displays no mass, no tenderness and no fullness. Left adnexum displays no mass, no tenderness and no fullness.  Musculoskeletal: Normal  range of motion. She exhibits no edema and no tenderness.  Neurological: She is alert and oriented to person, place, and time.  Skin: Skin is warm and dry. Rash noted. Rash is maculopapular. No erythema.  Diffuse patchy rash that is excoriated and involves trunk, upper and lower ext.  Spares the face  Psychiatric: She has a normal mood and affect. Her behavior is normal.    ED Course  Procedures (including critical care time)  Labs Reviewed  CBC WITH DIFFERENTIAL - Abnormal; Notable for the following:    Hemoglobin 10.1 (*)    HCT 29.5 (*)    MCV 71.1 (*)    MCH 24.3 (*)    Neutrophils Relative 42 (*)    Lymphocytes Relative 50 (*)    Lymphs Abs 4.8 (*)    All other components within normal limits  WET PREP, GENITAL  GC/CHLAMYDIA PROBE AMP  BASIC METABOLIC PANEL   Ct Head Wo Contrast  05/05/2012  *RADIOLOGY REPORT*  Clinical Data: Generalized rash and headache for 2 weeks.  Itching in the vaginal area.  History of abortion 3 weeks ago.  CT HEAD WITHOUT CONTRAST  Technique:  Contiguous axial images were obtained from the base of the skull through the vertex without contrast.  Comparison: CT of the head 02/25/2005  Findings: There is no intra or extra-axial fluid collection or mass lesion.  The basilar cisterns and ventricles have a normal appearance.  There is no CT evidence for acute infarction or hemorrhage.  Bone windows show no calvarial fracture.  The visualized paranasal sinuses are well-aerated.  IMPRESSION: Negative exam.   Original Report Authenticated By: Norva Pavlov, M.D.      1. Allergic reaction caused by a drug       MDM   Patient is here with evidence of an allergic reaction which most likely resulted from the medication she was given for a therapeutic abortion 2 weeks ago. She states initially it started with itching in her vaginal area that then spread to cause a rash over her entire body. She saw her GYN on Wednesday who started her on Benadryl and ketoconazole  without improvement. Patient also states since that time the rash has started she's been getting intermittent headaches. They do respond to Tylenol and ibuprofen but they continue to recur. She denies any fever and has no symptoms suggestive of meningitis or subarachnoid hemorrhage. Shows no neurologic findings and feel the headache is related to the reaction. Pelvic exam without any significant findings. Patient states she starting have her followup to ensure her pregnancy hormone is back to normal.  After headache cocktail patient's headache is resolved. Labs are within normal limits his head CT is negative. The patient will be started on prednisone and discharged home to follow up with her PCP.  Gwyneth Sprout, MD 05/05/12 1610  Gwyneth Sprout, MD 05/05/12 9604

## 2012-05-05 NOTE — ED Notes (Signed)
Pt comfortable with d/c and f/u instructions. 

## 2012-09-14 ENCOUNTER — Emergency Department (INDEPENDENT_AMBULATORY_CARE_PROVIDER_SITE_OTHER)
Admission: EM | Admit: 2012-09-14 | Discharge: 2012-09-14 | Disposition: A | Discharge status: Home / Self Care | Payer: Medicaid Other | Source: Home / Self Care | Attending: Family Medicine | Admitting: Family Medicine

## 2012-09-14 ENCOUNTER — Encounter (HOSPITAL_COMMUNITY): Payer: Self-pay | Admitting: Emergency Medicine

## 2012-09-14 DIAGNOSIS — H6120 Impacted cerumen, unspecified ear: Secondary | ICD-10-CM

## 2012-09-14 DIAGNOSIS — J0100 Acute maxillary sinusitis, unspecified: Secondary | ICD-10-CM

## 2012-09-14 DIAGNOSIS — H6123 Impacted cerumen, bilateral: Secondary | ICD-10-CM

## 2012-09-14 MED ORDER — FLUTICASONE PROPIONATE 50 MCG/ACT NA SUSP: 1.0000 | Freq: Two times a day (BID) | NASAL | Status: DC

## 2012-09-14 MED ORDER — AMOXICILLIN-POT CLAVULANATE 875-125 MG PO TABS: 1.0000 | ORAL_TABLET | Freq: Two times a day (BID) | ORAL | Status: DC

## 2012-09-14 NOTE — ED Notes (Signed)
Pt c/o head congestion. Runny nose. Cough. Mild chest pain with sob. Pt states unable to breath through nose. Symptoms worse night.  Diarrhea and nausea. Denies fever and vomiting.  Pt has tried otc meds with no relief of symptoms.

## 2012-09-14 NOTE — ED Notes (Signed)
Ear cleaning then will discharge.

## 2012-09-14 NOTE — ED Provider Notes (Signed)
CSN: [id number]     Arrival date & time 09/14/12  2130 History     First MD Initiated Contact with Patient 09/14/12 1044     Chief Complaint  Patient presents with  . URI   (Consider location/radiation/quality/duration/timing/severity/associated sxs/prior Treatment) Patient is a 32 y.o. female presenting with URI. The history is provided by the patient.  URI Presenting symptoms: congestion, cough, ear pain and rhinorrhea   Presenting symptoms: no fever   Severity:  Mild Onset quality:  Gradual Duration:  2 days Progression:  Unchanged Chronicity:  New Associated symptoms: no wheezing   Risk factors: no recent illness and no sick contacts     Past Medical History  Diagnosis Date  . 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 Heart murmur   . Abortion    Past Surgical History  Procedure Laterality Date  . Breast surgery  2005    L breast infection after loss  . Cesarean section  02/24/2011    Procedure: CESAREAN SECTION;  Surgeon: Brock Bad, MD;  Location: [facility];  Service: Gynecology;  Laterality: N/A;  Primary, cord ph 7.29   Family History  Problem Relation Age of Onset  . Diabetes Maternal Aunt   . Cancer Maternal Grandfather     lung   History  Substance Use Topics  . Smoking status: Never Smoker   . Smokeless tobacco: Never Used  . Alcohol Use: Yes   OB History   Grav Para Term Preterm Abortions TAB SAB Ect Mult Living   6 3 3  0 3 1 2  0 0 2     Review of Systems  Constitutional: Positive for appetite change. Negative for fever.  HENT: Positive for ear pain, congestion, rhinorrhea and postnasal drip.   Respiratory: Positive for cough. Negative for shortness of breath and wheezing.   Cardiovascular: Negative.     Allergies  Codeine  Home Medications   Current Outpatient Rx  Name  Route  Sig  Dispense  Refill  . amoxicillin-clavulanate (AUGMENTIN) 875-125 MG per tablet   Oral   Take 1 tablet by mouth 2 (two) times daily.   20 tablet   0   .  diphenhydrAMINE (BENADRYL) 25 mg capsule   Oral   Take 25 mg by mouth every 6 (six) hours as needed for itching or allergies.         . fluticasone (FLONASE) 50 MCG/ACT nasal spray   Nasal   Place 1 spray into the nose 2 (two) times daily.   1 g   2   . ketoconazole (NIZORAL) 2 % cream   Topical   Apply 1 application topically 2 (two) times daily as needed (to affected area).         . predniSONE (DELTASONE) 20 MG tablet   Oral   Take 2 tablets (40 mg total) by mouth daily. Take 2 tabs (40mg ) po daily for 5 days and then take 1 tab (20mg )po daily for 5 days   15 tablet   0    BP 117/76  Pulse 71  Temp(Src) 98.4 F (36.9 C) (Oral)  Resp 18  SpO2 100%  LMP 09/04/2012  Breastfeeding? No Physical Exam  Nursing note and vitals reviewed. Constitutional: She is oriented to person, place, and time. She appears well-developed and well-nourished.  HENT:  Ears:  Nose: Mucosal edema and rhinorrhea present.    Mouth/Throat: Oropharynx is clear and moist.  Eyes: Pupils are equal, round, and reactive to light.  Neck:  Normal range of motion. Neck supple.  Cardiovascular: Normal rate and regular rhythm.   Pulmonary/Chest: Breath sounds normal.  Lymphadenopathy:    She has no cervical adenopathy.  Neurological: She is alert and oriented to person, place, and time.  Skin: Skin is warm and dry.    ED Course   Procedures (including critical care time)  Labs Reviewed - No data to display No results found. 1. Sinusitis, acute maxillary   2. Cerumen impaction, bilateral     MDM    Linna Hoff, MD 09/14/12 [phone]

## 2013-03-20 ENCOUNTER — Ambulatory Visit (INDEPENDENT_AMBULATORY_CARE_PROVIDER_SITE_OTHER): Payer: Medicaid Other | Admitting: Family Medicine

## 2013-03-20 ENCOUNTER — Ambulatory Visit (HOSPITAL_COMMUNITY)
Admission: RE | Admit: 2013-03-20 | Discharge: 2013-03-20 | Disposition: A | Discharge status: Home / Self Care | Payer: Medicaid Other | Source: Ambulatory Visit | Attending: Family Medicine | Admitting: Family Medicine

## 2013-03-20 ENCOUNTER — Encounter: Payer: Self-pay | Admitting: Family Medicine

## 2013-03-20 VITALS — BP 129/84 | HR 73 | Temp 98.0°F | Ht 67.0 in | Wt 263.0 lb

## 2013-03-20 DIAGNOSIS — R197 Diarrhea, unspecified: Secondary | ICD-10-CM

## 2013-03-20 DIAGNOSIS — R002 Palpitations: Secondary | ICD-10-CM | POA: Insufficient documentation

## 2013-03-20 DIAGNOSIS — R011 Cardiac murmur, unspecified: Secondary | ICD-10-CM

## 2013-03-20 DIAGNOSIS — E669 Obesity, unspecified: Secondary | ICD-10-CM

## 2013-03-20 DIAGNOSIS — R079 Chest pain, unspecified: Secondary | ICD-10-CM

## 2013-03-20 NOTE — Patient Instructions (Signed)
For the weight, I am giving you a food journal to use. You can bring it back to me or talk with our nutritionist.  I recommend that you meet with our Registered Dietitian (Nutritionist), Dr. Vickki MuffJeanne Sykes for help addressing your dietary and eating habits.  You can schedule an appointment with her by calling her directly at [phone].   I will see you back for a dedicated well woman exam.   For the chest pain, we will contact you to set up an appointment for an echo of the heart.

## 2013-03-21 ENCOUNTER — Telehealth: Payer: Self-pay | Admitting: Family Medicine

## 2013-03-21 ENCOUNTER — Encounter: Payer: Self-pay | Admitting: Family Medicine

## 2013-03-21 DIAGNOSIS — R197 Diarrhea, unspecified: Secondary | ICD-10-CM | POA: Insufficient documentation

## 2013-03-21 DIAGNOSIS — R079 Chest pain, unspecified: Secondary | ICD-10-CM | POA: Insufficient documentation

## 2013-03-21 DIAGNOSIS — R011 Cardiac murmur, unspecified: Secondary | ICD-10-CM | POA: Insufficient documentation

## 2013-03-21 HISTORY — DX: Morbid (severe) obesity due to excess calories: E66.01

## 2013-03-21 NOTE — Telephone Encounter (Signed)
Please schedule patient for an echo for evaluation of murmur and dyspnea.  Thank you!  Erika ChancyStephanie Alberto Pina, PGY-3 Family Medicine Resident

## 2013-03-21 NOTE — Assessment & Plan Note (Signed)
atypical and chronic. Reproducible on palpation which makes musculoskeletal/breast etiology more likely. EKG today did not reveal any arrhythmia or prolonged QTc or WPW. Will obtain echo in setting of dyspnea and heart murmur.

## 2013-03-21 NOTE — Progress Notes (Signed)
Patient ID: [name], female   DOB: [birth year], 33 y.o.   MRN: [id number]  Chief Complaint  Patient presents with  . new patient    establishing care   HPI SKYLAN [name] is a 33 y.o. female who presents to establish care.  Her concerns include the following:  - chest pain: located on right and left side, sharp, lasts a few seconds. Has been ongoing for several years. She reports shortness of breath and feeling of palpitations and some dizziness when it happens. Non exertional. Occurs at rest. She reports that she has been more short of breath recently and gets short winded when she goes up a flight of stairs.  She denies any chest pressure or diaphoresis or nausea when this occurs. She has never been evaluated for it. - diarrhea and abdominal cramps: had watery stools 1 week ago which lasted intermittently for 2 weeks. Has not had episode for 1 week. Non bloody stools at the time. No vomiting, no nausea. Associated with abdominal cramping that was relieved with bm. Thinks it could have been related to different foods she ate.  She does report an occurrence of blood in stool 2 months ago, x2. On paper and inside stool. Not associated with straining or pain, no associated constipation. Had this happen once before 6 months ago.  - weight loss: patient reports wanting to loose weight. She doesn't exercise on a regular basis. She tries to eat fruits and vegetables. Eats beef, chicken, seafood. Drinks 2 liter mountain dew once per month. No regular alcohol.   Past Medical History  Diagnosis Date  . 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 Heart murmur   . Abortion     Past Surgical History  Procedure Laterality Date  . Breast surgery  2005    L breast infection after loss  . Cesarean section  02/24/2011    Procedure: CESAREAN SECTION;  Surgeon: Brock Bad, MD;  Location: [facility];  Service: Gynecology;  Laterality: N/A;  Primary, cord ph 7.29    Family History  Problem Relation Age of Onset   . Diabetes Maternal Aunt   . Breast cancer Maternal Aunt     2 maternal aunts with breast cancer  . Cancer Maternal Grandfather     lung  . Colon cancer Maternal Grandfather     died in his 44's.     Social History History  Substance Use Topics  . Smoking status: Never Smoker   . Smokeless tobacco: Never Used  . Alcohol Use: Yes     Comment: occasional, social drinking    Allergies  Allergen Reactions  . Codeine Nausea And Vomiting    Current Outpatient Prescriptions  Medication Sig Dispense Refill  . amoxicillin-clavulanate (AUGMENTIN) 875-125 MG per tablet Take 1 tablet by mouth 2 (two) times daily.  20 tablet  0  . diphenhydrAMINE (BENADRYL) 25 mg capsule Take 25 mg by mouth every 6 (six) hours as needed for itching or allergies.      . fluticasone (FLONASE) 50 MCG/ACT nasal spray Place 1 spray into the nose 2 (two) times daily.  1 g  2  . ketoconazole (NIZORAL) 2 % cream Apply 1 application topically 2 (two) times daily as needed (to affected area).      . predniSONE (DELTASONE) 20 MG tablet Take 2 tablets (40 mg total) by mouth daily. Take 2 tabs (40mg ) po daily for 5 days and then take 1 tab (20mg )po daily for 5 days  15 tablet  0   No current facility-administered medications for this visit.    Review of Systems Review of Systems Negative except per HPI Blood pressure 129/84, pulse 73, temperature 98 F (36.7 C), temperature source Oral, height 5\' 7"  (1.702 m), weight 263 lb (119.296 kg), last menstrual period 02/12/2013.  Physical Exam Physical Exam Physical Examination: General appearance - alert, well appearing, and in no distress, obese. Mouth - mucous membranes moist, pharynx normal without lesions Chest - clear to auscultation, no wheezes, rales or rhonchi, symmetric air entry Heart - normal rate and regular rhythm, S1 and S2 normal, systolic murmur 3/6 best heard at the right upper sternal border, louder with squatting.  Chest - reproducible tenderness  with palpation of left and right breasts.  Abdomen - soft, nontender, nondistended, no masses or organomegaly Extremities - no pedal edema Skin - no rash, no discoloration noted  Data Reviewed EKG: normal sinus rhythm, ventricular rate: 67 QTc: 426ms, PR: 134ms. Non specific T wave inversion in lead III.  No ST segment changes.   Assessment    33 yo female who presents as new patient.     Plan    - chest pain: atypical and chronic. Reproducible on palpation which makes musculoskeletal/breast etiology more likely. EKG today did not reveal any arrhythmia or prolonged QTc or WPW. Will obtain echo in setting of dyspnea and heart murmur.  - diarrhea: resolved. Likely viral enteritis. Also recommended that patient keep a record of triggers if this were to happen again.  - weight: gave Dr. Gerilyn PilgrimSykes' number although patient seemed a bit hesitant. Gave food journal for patient to fill out. Patient asked about diet pills. Explained that I would prefer for her to start with diet and exercise first and to evaluate her heart murmur before starting any weight loss medicine.  - healthcare maintenance: patient to make appointment for well woman exam. Also, patient to return fasting for lipid, CMP and A1C.      [name], [name] 03/21/2013, 8:53 AM Family Medicine Resident

## 2013-03-21 NOTE — Assessment & Plan Note (Signed)
Likely viral enteritis. Also recommended that patient keep a record of triggers if this were to happen again.

## 2013-03-21 NOTE — Assessment & Plan Note (Signed)
gave Dr. Gerilyn PilgrimSykes' number although patient seemed a bit hesitant. Gave food journal for patient to fill out. Patient asked about diet pills. Explained that I would prefer for her to start with diet and exercise first and to evaluate her heart murmur before starting any weight loss medicine.

## 2013-03-21 NOTE — Assessment & Plan Note (Signed)
Obtain echo °

## 2013-03-22 NOTE — Telephone Encounter (Signed)
We are not on patient's insurance card therefore we cannot schedule or refer patient for any tests.  Pt was notified of this and states she is "working on it".  She will notify us when this is completed.  [name], Darlyne RussianKristen L, CMA

## 2013-04-10 ENCOUNTER — Encounter: Payer: Medicaid Other | Admitting: Family Medicine

## 2013-05-30 ENCOUNTER — Encounter: Payer: Self-pay | Admitting: Family Medicine

## 2013-05-30 ENCOUNTER — Ambulatory Visit (INDEPENDENT_AMBULATORY_CARE_PROVIDER_SITE_OTHER): Payer: Medicaid Other | Admitting: Family Medicine

## 2013-05-30 VITALS — BP 121/80 | HR 95 | Temp 98.7°F | Wt 259.0 lb

## 2013-05-30 DIAGNOSIS — J069 Acute upper respiratory infection, unspecified: Secondary | ICD-10-CM

## 2013-05-30 MED ORDER — CETIRIZINE HCL 10 MG PO TABS: 10.0000 mg | ORAL_TABLET | Freq: Every day | ORAL | Status: DC

## 2013-05-30 MED ORDER — FLUTICASONE PROPIONATE 50 MCG/ACT NA SUSP: 2.0000 | Freq: Every day | NASAL | Status: DC

## 2013-05-30 MED ORDER — DEXTROMETHORPHAN HBR 15 MG/5ML PO SYRP: 10.0000 mL | ORAL_SOLUTION | Freq: Four times a day (QID) | ORAL | Status: DC | PRN

## 2013-05-30 MED ORDER — OLOPATADINE HCL 0.2 % OP SOLN: 1.0000 [drp] | Freq: Every day | OPHTHALMIC | Status: DC

## 2013-05-30 NOTE — Patient Instructions (Signed)
I am sorry you are not feeling well.  Try the medications to see if that will help with your symptoms.  Let us know if you are not getting better.  [name] M. Mirza Kidney, M.D.

## 2013-05-30 NOTE — Assessment & Plan Note (Signed)
A: Acute viral URI vs. Allergic rhinitis.   P: - Flonase - Zyrtec - Pataday - Robitussin prn - F/u if fails to improve

## 2013-05-30 NOTE — Progress Notes (Signed)
Patient ID: Rosilyn Mingsina M Morton, female   DOB: 02/08/1980, 33 y.o.   MRN: [id number]    Subjective: HPI: Patient is a 33 y.o. female presenting to clinic today for same day appointment.  Upper Respiratory Infection Patient complains of symptoms of a URI. Symptoms include congestion, cough described as productive of mucoid sputum, post nasal drip and sneezing. Onset of symptoms was 3 days ago, and has been gradually worsening since that time. Treatment to date: TheraFlu, DayQuil which do no help. No known sick contacts.  History Reviewed: Some day smoker.  ROS: Please see HPI above.  Objective: Office vital signs reviewed. BP 121/80  Pulse 95  Temp(Src) 98.7 F (37.1 C) (Oral)  Wt 259 lb (117.482 kg)  SpO2 99%  Physical Examination:  General: Awake, alert. NAD HEENT: Atraumatic, normocephalic. MMM. Posterior pharyngeal erythema without exudate. TM wnl.  Neck: No masses palpated. No LAD Pulm: CTAB, no wheezes Cardio: RRR, no murmurs appreciated Neuro: Strength and sensation rossly intact  Assessment: 33 y.o. female with URI  Plan: See Problem List and After Visit Summary

## 2013-06-12 ENCOUNTER — Telehealth: Payer: Self-pay | Admitting: *Deleted

## 2013-06-12 ENCOUNTER — Ambulatory Visit: Payer: Medicaid Other | Admitting: Family Medicine

## 2013-06-12 NOTE — Telephone Encounter (Signed)
Left message for patient to return call. Ms [name] has an appointment with Dr Gwenlyn SaranLosq this morning to follow up on her 2D Echo. She has not had the 2D Echo done yet, unless she needs to be seen for something else we need to reschedule her appointment with Dr Gwenlyn SaranLosq until after the Echo. Also we need to schedule the Echo so please ask her when is a good time for her to have this done and I will set up the appointment.[name]

## 2013-06-19 NOTE — Telephone Encounter (Signed)
Left another message for patient to return call, please see previous message.Erika Cheadleobert L [name]

## 2013-06-25 NOTE — Telephone Encounter (Signed)
Patient never returned call.[name]  

## 2013-07-04 ENCOUNTER — Encounter: Payer: Self-pay | Admitting: Family Medicine

## 2013-07-04 ENCOUNTER — Ambulatory Visit (INDEPENDENT_AMBULATORY_CARE_PROVIDER_SITE_OTHER): Payer: Medicaid Other | Admitting: Family Medicine

## 2013-07-04 ENCOUNTER — Telehealth: Payer: Self-pay | Admitting: Family Medicine

## 2013-07-04 VITALS — BP 114/60 | HR 75 | Temp 98.4°F | Ht 66.0 in | Wt 262.0 lb

## 2013-07-04 DIAGNOSIS — R109 Unspecified abdominal pain: Secondary | ICD-10-CM

## 2013-07-04 DIAGNOSIS — F418 Other specified anxiety disorders: Secondary | ICD-10-CM

## 2013-07-04 DIAGNOSIS — F341 Dysthymic disorder: Secondary | ICD-10-CM

## 2013-07-04 HISTORY — DX: Other specified anxiety disorders: F41.8

## 2013-07-04 MED ORDER — FLUOXETINE HCL 20 MG PO TABS: 20.0000 mg | ORAL_TABLET | Freq: Every day | ORAL | Status: DC

## 2013-07-04 MED ORDER — POLYETHYLENE GLYCOL 3350 17 GM/SCOOP PO POWD: 17.0000 g | Freq: Two times a day (BID) | ORAL | Status: DC | PRN

## 2013-07-04 NOTE — Progress Notes (Signed)
Patient ID: [name]    DOB: 17-Nov-1980, 32 y.o.   MRN: [id number] --- Subjective:  [name] is a 33 y.o.female who presents with concern of abdominal cramping as well as increased anxiety, depression and feelings of hopelessness.  - depression: started 1 month ago but got increasingly worst in the last 2 days. She had thoughts of hurting herslef yesterday, hearing voices that were telling her that she would be better off dead. She did not have a plan in place and doesn't have any weapons or guns in the home. She is also paranoid that something or someone is out to get her. She has had difficulty sleeping, eating. Loss of interest. She has been having low energy. No acute event promptin this a month ago. No personal history of mental illness. She has an aunt and an uncle with bipolar. She doesn't drink, smoke or use drugs. She lives with her toddler son and feels like he is the reason to keep going. She works at a [employer].  She denies any current suicidal or homicidal ideation.   PHq9: 27, extremely difficult GAD: 21 score  - abdominal cramping: started 2 days ago. LMP: 06/16/13. Pain starts in the middle of abdomen and radiates to both sides and the anus. Pain is sharp shotting, intermittent. Lasts for 30 minutes. She takes tylenol which helps it. Recurs 2-3 hrs later. Some nausea, no vomiting. She has had an associated headache. Better with laying still. She has been constipated, making small amounts of stool at a time and straining to have a bowel movement. No blood in stool currently. Did see streak of blood on paper 1 month ago.   ROS: see HPI Past Medical History: reviewed and updated medications and allergies. Social History: Tobacco: e-cigarette. Stopped smoking last month  Objective: Filed Vitals:   07/04/13 1049  BP: 114/60  Pulse: 75  Temp: 98.4 F (36.9 C)    Physical Examination:   General appearance - alert, tearful at times, speech fluent and coherent Chest - clear  to auscultation, no wheezes, rales or rhonchi, symmetric air entry Heart - normal rate, regular rhythm Abdomen - soft, no guarding, no rebound, mild tenderness along the periumbilical area.

## 2013-07-04 NOTE — Assessment & Plan Note (Signed)
Likely from constipation. Did not do rectal since a lot of time was spent on depression and anxiety and assessing patient's safety. May need to do this in the future.  Start miralax

## 2013-07-04 NOTE — Assessment & Plan Note (Addendum)
Originally concerned for patient's safety and asked Dr. Pascal Lux to speak with her. She denies any suicidal ideation or homicidal thoughts. After talking with her more extensively, she reports that she doesn't currently feel that she or her son are at danger from herself. She doesn't feel necessary going to the [place] ED for evaluation at this time, but was contracted for safety to do so if she were to start feeling unsafe.  Patient gave me permission to call her mother and sister to let them know of the situation and ask them to be with her in the next few days.  Will start patient on prozac 20mg  daily and have her come back to see me tomorrow at 2pm.  Patient appears competent.  Wrote her out from work until Monday

## 2013-07-04 NOTE — Telephone Encounter (Signed)
Called patient's sister with patient's permission: number: [phone] to let her know of today's visit and to ask if patient's sister could keep an eye on patient and stay with her if possible in the next few days in case she were to have thoughts of harming herself. Patient's sister stated that she lives down the street from her, checked on her earlier this evening and was going to check on her again.   Marena Chancy, PGY-3 Family Medicine Resident

## 2013-07-04 NOTE — Patient Instructions (Signed)
I would like to see you back tomorrow. Please make an appointment for 1:30PM with me.   I recommend that you meet with our psychologist, Dr. Spero Geralds, for help dealing with your depression.  You can schedule an appointment with her by calling her directly at [phone].  The number to call is as follows:  [phone] Or [phone]

## 2013-07-05 ENCOUNTER — Encounter: Payer: Self-pay | Admitting: Family Medicine

## 2013-07-05 ENCOUNTER — Ambulatory Visit (INDEPENDENT_AMBULATORY_CARE_PROVIDER_SITE_OTHER): Payer: Medicaid Other | Admitting: Family Medicine

## 2013-07-05 VITALS — BP 129/81 | HR 102 | Temp 99.1°F | Wt 259.6 lb

## 2013-07-05 DIAGNOSIS — F418 Other specified anxiety disorders: Secondary | ICD-10-CM

## 2013-07-05 DIAGNOSIS — F341 Dysthymic disorder: Secondary | ICD-10-CM

## 2013-07-05 MED ORDER — OLANZAPINE 5 MG PO TABS: 5.0000 mg | ORAL_TABLET | Freq: Every day | ORAL | Status: DC

## 2013-07-05 NOTE — Patient Instructions (Signed)
I am going to start you on another medicine in addition to the prozac, called zyprexa.   [name] doesn't have any available appointments until October, but [facility] accepts walk ins Monday-Friday from 8am to 3pm.  When you go there, bring a photo ID, insurance card and list of current medications.    Here is also the number to [facility] in case of a crisis:  24-Hour HELPLINE  [phone]  or  [phone]   Our 24-hour, toll-free helpline is staffed by registered nurses and master's level clinicians who specialize in behavioral health. They offer immediate assistance and guidance for mental illness and substance abuse issues.  We also provide prompt in-person emergency assessments at our [facility].

## 2013-07-05 NOTE — Progress Notes (Signed)
Patient ID: TECKLA PHENG    DOB: 1980-06-10, 33 y.o.   MRN: [id number] --- Subjective:  Ardell is a 33 y.o.female who presents today for follow up on depression and anxiety. She is here with her little boy. She was seen yesterday and was asked to be seen again today to monitor her closely.  She states that she still is feeling sad and anxious and still is hearing voices but doesn't feel at harm from herself and not currently feeling suicidal or homicidal.  She took prozac yesterday which made her more sleepy and gave her some nausea. She feels like her mood is mildly improved today. She would like to get help. She is very concerned about being able to keep her job and do her job.   ROS: see HPI Past Medical History: reviewed and updated medications and allergies. Social History: Tobacco: none  Objective: Filed Vitals:   07/05/13 1448  BP: 129/81  Pulse: 102  Temp: 99.1 F (37.3 C)    Physical Examination:   General appearance - alert, well appearing, and in no distress Chest - clear to auscultation, no wheezes, rales or rhonchi, symmetric air entry Heart - normal rate, regular rhythm, normal S1, S2, no murmurs

## 2013-07-05 NOTE — Assessment & Plan Note (Signed)
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 She is not currently suicidal or homicidal and doesn't feel at danger from herself.  GIven some psychotic features, will add zyprexa to prozac.  Also directed her to be seen at [facility] in their walk in clinic on Monday. [facility] is not accepting patients until October.  Gave her number for crisis line in case she felt at danger. Another option would be to go to [facility].

## 2013-07-09 ENCOUNTER — Telehealth: Payer: Self-pay | Admitting: Family Medicine

## 2013-07-09 NOTE — Telephone Encounter (Signed)
Pt called because she still has not wanted to go back to work because of her depression and anxiety. She is seeing the psychiatrist tomorrow and would like the doctor to call her so she can know what she should do. jw

## 2013-07-12 NOTE — Telephone Encounter (Signed)
Spoke with patient she states she still has'nt been to see the psychiatrist yet but plans to do so next week. She states the medicine makes her very drowsy and she plans to file for disability. Instructed patient to follow up with psychiatrist and then with PCP, patient expressed understanding.

## 2013-10-24 ENCOUNTER — Ambulatory Visit (INDEPENDENT_AMBULATORY_CARE_PROVIDER_SITE_OTHER): Payer: Medicaid Other | Admitting: Family Medicine

## 2013-10-24 ENCOUNTER — Encounter: Payer: Self-pay | Admitting: Family Medicine

## 2013-10-24 ENCOUNTER — Other Ambulatory Visit (HOSPITAL_COMMUNITY)
Admission: RE | Admit: 2013-10-24 | Discharge: 2013-10-24 | Disposition: A | Discharge status: Home / Self Care | Payer: Medicaid Other | Source: Ambulatory Visit | Attending: Family Medicine | Admitting: Family Medicine

## 2013-10-24 VITALS — BP 120/82 | HR 80 | Temp 98.8°F | Ht 66.0 in | Wt 261.0 lb

## 2013-10-24 DIAGNOSIS — N898 Other specified noninflammatory disorders of vagina: Secondary | ICD-10-CM

## 2013-10-24 DIAGNOSIS — B9689 Other specified bacterial agents as the cause of diseases classified elsewhere: Secondary | ICD-10-CM | POA: Insufficient documentation

## 2013-10-24 DIAGNOSIS — N760 Acute vaginitis: Secondary | ICD-10-CM

## 2013-10-24 DIAGNOSIS — Z113 Encounter for screening for infections with a predominantly sexual mode of transmission: Secondary | ICD-10-CM | POA: Diagnosis present

## 2013-10-24 DIAGNOSIS — L293 Anogenital pruritus, unspecified: Secondary | ICD-10-CM

## 2013-10-24 DIAGNOSIS — N912 Amenorrhea, unspecified: Secondary | ICD-10-CM

## 2013-10-24 DIAGNOSIS — Z0000 Encounter for general adult medical examination without abnormal findings: Secondary | ICD-10-CM | POA: Insufficient documentation

## 2013-10-24 LAB — POCT WET PREP (WET MOUNT): CLUE CELLS WET PREP WHIFF POC: POSITIVE

## 2013-10-24 LAB — RPR

## 2013-10-24 LAB — POCT URINE PREGNANCY: PREG TEST UR: NEGATIVE

## 2013-10-24 MED ORDER — METRONIDAZOLE 500 MG PO TABS: 500.0000 mg | ORAL_TABLET | Freq: Two times a day (BID) | ORAL | Status: DC

## 2013-10-24 NOTE — Progress Notes (Signed)
   Subjective:    Patient ID: [name], female    DOB: September 07, 1980, 33 y.o.   MRN: [id number]  Patient presents for a same day appointment.  HPI  VAGINAL ITCHING: - Reports intermittent itching on "outside of vagina", thought it might be due to hair irritation, states she has shaved and it improved for day or two, then returned. Overall, states that symptoms seemed to have started about > 1 year ago after had medically induced abortion, reports intermittent episodes lasting weeks and then resolve for few weeks. Previously itching was more generalized and even treated at [facility] - For past 6 months "itching" seems to be localized mostly to vaginal area - States she has been using a "Mercy Ointment" topical, might have anti-fungal in it for few days, also admits to switching soaps used, and avoiding regular shaving to prevent further irritation - Worse with menstrual cycle, and intercourse - Admits to increased amount clear discharge, no odor - Admits to regular intercourse over past 1 year, 2 different parents, most recent partner without condom use, last intercourse 2 weeks ago. No known STDs - Requesting STD testing, including HIV today - LMP 09/30/13 - regular schedule, no missed - Denies fevers, dysuria, flank or abdominal pain, rash, vaginal bleeding, no douching  PMH: - Hx Abortion about 1 year ago (medication induced)  I have reviewed and updated the following as appropriate: allergies and current medications  Social Hx: - Currently using E-cig  Review of Systems  See above HPI    Objective:   Physical Exam  BP 120/82  Pulse 80  Temp(Src) 98.8 F (37.1 C) (Oral)  Ht  (1.676 m)  Wt 261 lb (118.389 kg)  BMI 42.15 kg/m2  LMP 09/30/2013  Gen - well-appearing, NAD Abd - obese, soft, NTND, no masses, +active BS Ext - non-tender, no edema, peripheral pulses intact +2 b/l Skin - warm, dry, no rashes Pelvic Exam - Normal external female genitalia, without rash,  tenderness or erythema, noted multiple hair bumps along bilateral inner thighs that do not appear infected or tender. Vaginal canal without lesions. Normal appearing cervix, without lesions or bleeding. Physiologic discharge on exam, with notable +odor. Bimanual exam without masses or cervical motion tenderness.     Assessment & Plan:   See specific A&P problem list for details.

## 2013-10-24 NOTE — Patient Instructions (Addendum)
Dear [name], Thank you for coming in to clinic today.  Today we discussed your Vaginal Itching. 1. Pelvic exam wet prep showed BV - Bacterial Vaginosis 2. Sent rx for antibiotic - Metronidazole  tablets take twice daily - No alcohol while taking this medication  Please schedule a follow-up appointment with Dr. Leonides Schanz in 2 to 4 weeks for follow-up if symptoms persist.  If you have any other questions or concerns, please feel free to call the clinic to contact me. You may also schedule an earlier appointment if necessary.  However, if your symptoms get significantly worse, please go to the Emergency Department to seek immediate medical attention.  Saralyn Pilar, DO [facility]   Bacterial Vaginosis Bacterial vaginosis is a vaginal infection that occurs when the normal balance of bacteria in the vagina is disrupted. It results from an overgrowth of certain bacteria. This is the most common vaginal infection in women of childbearing age. Treatment is important to prevent complications, especially in pregnant women, as it can cause a premature delivery. CAUSES  Bacterial vaginosis is caused by an increase in harmful bacteria that are normally present in smaller amounts in the vagina. Several different kinds of bacteria can cause bacterial vaginosis. However, the reason that the condition develops is not fully understood. RISK FACTORS Certain activities or behaviors can put you at an increased risk of developing bacterial vaginosis, including:  Having a new sex partner or multiple sex partners.  Douching.  Using an intrauterine device (IUD) for contraception. Women do not get bacterial vaginosis from toilet seats, bedding, swimming pools, or contact with objects around them. SIGNS AND SYMPTOMS  Some women with bacterial vaginosis have no signs or symptoms. Common symptoms include:  Grey vaginal discharge.  A fishlike odor with discharge, especially after  sexual intercourse.  Itching or burning of the vagina and vulva.  Burning or pain with urination. DIAGNOSIS  Your health care provider will take a medical history and examine the vagina for signs of bacterial vaginosis. A sample of vaginal fluid may be taken. Your health care provider will look at this sample under a microscope to check for bacteria and abnormal cells. A vaginal pH test may also be done.  TREATMENT  Bacterial vaginosis may be treated with antibiotic medicines. These may be given in the form of a pill or a vaginal cream. A second round of antibiotics may be prescribed if the condition comes back after treatment.  HOME CARE INSTRUCTIONS   Only take over-the-counter or prescription medicines as directed by your health care provider.  If antibiotic medicine was prescribed, take it as directed. Make sure you finish it even if you start to feel better.  Do not have sex until treatment is completed.  Tell all sexual partners that you have a vaginal infection. They should see their health care provider and be treated if they have problems, such as a mild rash or itching.  Practice safe sex by using condoms and only having one sex partner. SEEK MEDICAL CARE IF:   Your symptoms are not improving after 3 days of treatment.  You have increased discharge or pain.  You have a fever. MAKE SURE YOU:   Understand these instructions.  Will watch your condition.  Will get help right away if you are not doing well or get worse. FOR MORE INFORMATION  Centers for Disease Control and Prevention, Division of STD Prevention: SolutionApps.co.za American Sexual Health Association (ASHA): www.ashastd.org  Document Released: 01/17/2005 Document Revised: 11/07/2012  Document Reviewed: 08/29/2012 [facility] Patient Information 2015 [place], [place]. This information is not intended to replace advice given to you by your health care provider. Make sure you discuss any questions you have with your  health care provider.

## 2013-10-24 NOTE — Assessment & Plan Note (Signed)
High risk sexual behavior with unprotected intercourse (no condoms), currently no longer with partner - Requested STD testing including HIV - Prior negative GC/Chlam 2014  Plan: 1. Ordered HIV, RPR 2. Collected GC/Chlam probe on pelvic exam 3. Will notify if results abnormal

## 2013-10-24 NOTE — Assessment & Plan Note (Signed)
Clinically with chronic recurrent vaginal itching without significant vaginal discharge - Wet prep consistent with BV, moderate clue cells and +whiff. Neg yeast and trich - Suspected recurrent BV  Plan: 1. Urine Pregnancy - negative 2. Wet prep - BV 3. Start Flagyl  BID x 7 days for BV, advised no EtOH 4. Advised to try yogurt with active cultures / probiotic, recommend condom use with intercourse 5. RTC if no improvement

## 2013-10-25 ENCOUNTER — Telehealth: Payer: Self-pay | Admitting: *Deleted

## 2013-10-25 LAB — CERVICOVAGINAL ANCILLARY ONLY
CHLAMYDIA, DNA PROBE: NEGATIVE
NEISSERIA GONORRHEA: NEGATIVE

## 2013-10-25 LAB — HIV ANTIBODY (ROUTINE TESTING W REFLEX): HIV: NONREACTIVE

## 2013-10-25 NOTE — Telephone Encounter (Signed)
Pt informed labs were negative. Blount, Deseree CMA

## 2013-10-25 NOTE — Telephone Encounter (Signed)
Message copied by Pamelia Hoit on Fri Oct 25, 2013  3:50 PM ------      Message from: Smitty Cords      Created: Fri Oct 25, 2013  3:42 PM      Regarding: Results negative       Hi Red Team,            Would you be able to call [name], to let her know that all recent lab results from 10/24/13 are negative.      - Including HIV, RPR, GC/Chlamydia swab            Thank you,      Alex ------

## 2013-11-12 ENCOUNTER — Ambulatory Visit (INDEPENDENT_AMBULATORY_CARE_PROVIDER_SITE_OTHER): Payer: Medicaid Other | Admitting: *Deleted

## 2013-11-12 DIAGNOSIS — Z111 Encounter for screening for respiratory tuberculosis: Secondary | ICD-10-CM

## 2013-11-12 NOTE — Progress Notes (Signed)
   PPD placed Left Forearm.  Pt to return 11/14/2013 for reading.  Pt tolerated intradermal injection. Clovis PuMartin, Kamarah Bilotta L, RN

## 2013-11-14 ENCOUNTER — Ambulatory Visit: Payer: Medicaid Other | Admitting: *Deleted

## 2013-11-14 DIAGNOSIS — Z111 Encounter for screening for respiratory tuberculosis: Secondary | ICD-10-CM

## 2013-11-14 LAB — TB SKIN TEST
INDURATION: 0 mm
TB SKIN TEST: NEGATIVE

## 2013-11-14 NOTE — Progress Notes (Signed)
PPD Reading Note  PPD read and results entered in EpicCare.  Result: 0 mm induration.  Interpretation: negative  Allergic reaction: no

## 2013-12-25 ENCOUNTER — Encounter: Payer: Medicaid Other | Admitting: Family Medicine

## 2014-04-28 ENCOUNTER — Encounter: Payer: Self-pay | Admitting: Family Medicine

## 2014-04-28 ENCOUNTER — Ambulatory Visit (INDEPENDENT_AMBULATORY_CARE_PROVIDER_SITE_OTHER): Payer: Medicaid Other | Admitting: Family Medicine

## 2014-04-28 ENCOUNTER — Other Ambulatory Visit (HOSPITAL_COMMUNITY)
Admission: RE | Admit: 2014-04-28 | Discharge: 2014-04-28 | Disposition: A | Discharge status: Home / Self Care | Payer: Medicaid Other | Source: Ambulatory Visit | Attending: Family Medicine | Admitting: Family Medicine

## 2014-04-28 ENCOUNTER — Telehealth: Payer: Self-pay | Admitting: *Deleted

## 2014-04-28 VITALS — BP 103/64 | HR 70 | Temp 97.6°F | Ht 66.0 in | Wt 267.4 lb

## 2014-04-28 DIAGNOSIS — N898 Other specified noninflammatory disorders of vagina: Secondary | ICD-10-CM

## 2014-04-28 DIAGNOSIS — Z113 Encounter for screening for infections with a predominantly sexual mode of transmission: Secondary | ICD-10-CM | POA: Insufficient documentation

## 2014-04-28 DIAGNOSIS — A499 Bacterial infection, unspecified: Secondary | ICD-10-CM

## 2014-04-28 DIAGNOSIS — B9689 Other specified bacterial agents as the cause of diseases classified elsewhere: Secondary | ICD-10-CM

## 2014-04-28 DIAGNOSIS — N760 Acute vaginitis: Secondary | ICD-10-CM

## 2014-04-28 DIAGNOSIS — L298 Other pruritus: Secondary | ICD-10-CM

## 2014-04-28 DIAGNOSIS — Z202 Contact with and (suspected) exposure to infections with a predominantly sexual mode of transmission: Secondary | ICD-10-CM

## 2014-04-28 LAB — POCT WET PREP (WET MOUNT): CLUE CELLS WET PREP WHIFF POC: POSITIVE

## 2014-04-28 MED ORDER — METRONIDAZOLE 500 MG PO TABS: 500.0000 mg | ORAL_TABLET | Freq: Two times a day (BID) | ORAL | Status: DC

## 2014-04-28 NOTE — Patient Instructions (Signed)
Thank you for coming to see me today. It was a pleasure. Today we talked about:   STI (Sexually transmitted infection) testing: you will be called with the results. We performed HIV, Syphilis, Gonorrhea, Chlamydia and I am checking for Trichomonas. One of the tests will also look for yeast and bacterial vaginosis infections, which are not considered sexually transmitted infections.  If you have any questions or concerns, please do not hesitate to call the office at [phone].  Sincerely,  Jacquelin Hawkingalph Nettey, MD

## 2014-04-28 NOTE — Telephone Encounter (Signed)
-----   Message from Narda Bondsalph A Nettey, MD sent at 04/28/2014  1:54 PM EDT ----- Wet prep significant for bacterial vaginosis. Metronidazole called into pharmacy. Please inform patient.

## 2014-04-28 NOTE — Addendum Note (Signed)
Addended by: Jacquelin HawkingNETTEY, Basilia Stuckert A on: 04/28/2014 01:53 PM   Modules accepted: Orders

## 2014-04-28 NOTE — Telephone Encounter (Signed)
Spoke with pt and informed her of below. Zimmerman Rumple, Zhanae Proffit D  

## 2014-04-28 NOTE — Progress Notes (Addendum)
    Subjective   [name] is a 34 y.o. female that presents for a same day visit  1. Concern for possible STI contraction: She is not having sexual intercourse. Last time she had intercourse was one month. She was with one partner for the last year and did not use condoms. She has a recent TAB one and a half weeks ago. No vaginal bleeding, or discharge but does have some mild itching. No dysuria.   History  Substance Use Topics  . Smoking status: Never Smoker   . Smokeless tobacco: Never Used  . Alcohol Use: Yes     Comment: occasional, social drinking   Allergies  Allergen Reactions  . Codeine Nausea And Vomiting   ROS Per HPI  Objective   BP 103/64 mmHg  Pulse 70  Temp(Src) 97.6 F (36.4 C) (Oral)  Ht 5\' 6"  (1.676 Erika)  Wt 267 lb 6.4 oz (121.292 kg)  BMI 43.18 kg/m2  LMP 04/21/2014 (Approximate)  General: Well appearing, obese female. No distress. Genitourinary: No external lesions noted. Vagina without bleeding and with thin, white discharge. No odor noted. Cervix visible, edematous and without bleeding from os  Assessment and Plan   Possible exposure to STI  GC/Chlamydia, HIV, RPR  Wet prep  Addendum:  Bacterial vaginosis  Metronidazole 500mg  BID x7 days

## 2014-04-29 LAB — HIV ANTIBODY (ROUTINE TESTING W REFLEX): HIV 1&2 Ab, 4th Generation: NONREACTIVE

## 2014-04-29 LAB — GC/CHLAMYDIA PROBE AMP (~~LOC~~) NOT AT ARMC
CHLAMYDIA, DNA PROBE: NEGATIVE
NEISSERIA GONORRHEA: NEGATIVE

## 2014-05-01 ENCOUNTER — Encounter: Payer: Self-pay | Admitting: *Deleted

## 2015-01-26 ENCOUNTER — Inpatient Hospital Stay (HOSPITAL_COMMUNITY)
Admission: AD | Admit: 2015-01-26 | Discharge: 2015-01-26 | Disposition: A | Discharge status: Home / Self Care | Payer: Self-pay | Source: Ambulatory Visit | Attending: Obstetrics and Gynecology | Admitting: Obstetrics and Gynecology

## 2015-01-26 ENCOUNTER — Encounter (HOSPITAL_COMMUNITY): Payer: Self-pay | Admitting: *Deleted

## 2015-01-26 DIAGNOSIS — R1030 Lower abdominal pain, unspecified: Secondary | ICD-10-CM | POA: Insufficient documentation

## 2015-01-26 DIAGNOSIS — Z3202 Encounter for pregnancy test, result negative: Secondary | ICD-10-CM

## 2015-01-26 DIAGNOSIS — A499 Bacterial infection, unspecified: Secondary | ICD-10-CM

## 2015-01-26 DIAGNOSIS — N760 Acute vaginitis: Secondary | ICD-10-CM

## 2015-01-26 DIAGNOSIS — R112 Nausea with vomiting, unspecified: Secondary | ICD-10-CM

## 2015-01-26 DIAGNOSIS — B9689 Other specified bacterial agents as the cause of diseases classified elsewhere: Secondary | ICD-10-CM | POA: Insufficient documentation

## 2015-01-26 DIAGNOSIS — F329 Major depressive disorder, single episode, unspecified: Secondary | ICD-10-CM | POA: Insufficient documentation

## 2015-01-26 DIAGNOSIS — Z885 Allergy status to narcotic agent status: Secondary | ICD-10-CM | POA: Insufficient documentation

## 2015-01-26 DIAGNOSIS — R109 Unspecified abdominal pain: Secondary | ICD-10-CM

## 2015-01-26 LAB — POCT PREGNANCY, URINE: Preg Test, Ur: NEGATIVE

## 2015-01-26 LAB — URINALYSIS, ROUTINE W REFLEX MICROSCOPIC
BILIRUBIN URINE: NEGATIVE
Glucose, UA: NEGATIVE mg/dL
Hgb urine dipstick: NEGATIVE
KETONES UR: NEGATIVE mg/dL
LEUKOCYTES UA: NEGATIVE
NITRITE: NEGATIVE
PROTEIN: NEGATIVE mg/dL
Specific Gravity, Urine: 1.025 (ref 1.005–1.030)
pH: 5.5 (ref 5.0–8.0)

## 2015-01-26 LAB — WET PREP, GENITAL
SPERM: NONE SEEN
Trich, Wet Prep: NONE SEEN
YEAST WET PREP: NONE SEEN

## 2015-01-26 LAB — CBC
HEMATOCRIT: 33.6 % — AB (ref 36.0–46.0)
HEMOGLOBIN: 10.8 g/dL — AB (ref 12.0–15.0)
MCH: 24 pg — AB (ref 26.0–34.0)
MCHC: 32.1 g/dL (ref 30.0–36.0)
MCV: 74.7 fL — ABNORMAL LOW (ref 78.0–100.0)
Platelets: 230 10*3/uL (ref 150–400)
RBC: 4.5 MIL/uL (ref 3.87–5.11)
RDW: 16.2 % — ABNORMAL HIGH (ref 11.5–15.5)
WBC: 5.6 10*3/uL (ref 4.0–10.5)

## 2015-01-26 LAB — HCG, QUANTITATIVE, PREGNANCY: hCG, Beta Chain, Quant, S: <1 m[IU]/mL (ref <5)

## 2015-01-26 MED ORDER — KETOROLAC TROMETHAMINE 60 MG/2ML IM SOLN
60.0000 mg | Freq: Once | INTRAMUSCULAR | Status: AC
  Administered 2015-01-26: 60 mg via INTRAMUSCULAR
  Filled 2015-01-26: qty 2

## 2015-01-26 MED ORDER — IBUPROFEN 600 MG PO TABS: 600.0000 mg | ORAL_TABLET | Freq: Four times a day (QID) | ORAL | Status: DC | PRN

## 2015-01-26 MED ORDER — PROMETHAZINE HCL 25 MG PO TABS: 25.0000 mg | ORAL_TABLET | Freq: Four times a day (QID) | ORAL | Status: DC | PRN

## 2015-01-26 MED ORDER — METRONIDAZOLE 500 MG PO TABS: 500.0000 mg | ORAL_TABLET | Freq: Two times a day (BID) | ORAL | Status: DC

## 2015-01-26 NOTE — MAU Provider Note (Signed)
History     CSN: [id number]  Arrival date and time: 01/26/15 1502   First Provider Initiated Contact with Patient 01/26/15 1547       Chief Complaint  Patient presents with  . Abdominal Cramping  . Possible Pregnancy  . Nausea   [name] is a 34 y.o. female who presents for abdominal cramping, nausea, & possible pregnancy.   Abdominal Cramping This is a new problem. Episode onset: 2 weeks. The problem occurs intermittently. The problem has been unchanged. Pain location: lower abdominal. The pain is at a severity of 5/10. The quality of the pain is cramping. The abdominal pain does not radiate. Associated symptoms include nausea. Pertinent negatives include no constipation, diarrhea, dysuria or vomiting. Nothing aggravates the pain. The pain is relieved by nothing. She has tried acetaminophen for the symptoms. The treatment provided mild relief.    Had 3 negative pregnancy tests last week. LMP was 12/1 & pt states period is very regular with a 28 day cycle & she "feels" like she is pregnant.  Denies vaginal bleeding.  Reports some clear vaginal discharge with fishy odor.  Denies itching or irritation.  Denies diarrhea or constipation.  Nauseated daily x 2 weeks. Last vomited 2 weeks ago, once.      OB History    Gravida Para Term Preterm AB TAB SAB Ectopic Multiple Living   0 0 0 2      Past Medical History  Diagnosis Date  . 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 Heart murmur   . Abortion     Past Surgical History  Procedure Laterality Date  . Breast surgery  2005    L breast infection after loss  . Cesarean section  02/24/2011    Procedure: CESAREAN SECTION;  Surgeon: Brock Bad, MD;  Location: [facility];  Service: Gynecology;  Laterality: N/A;  Primary, cord ph 7.29    Family History  Problem Relation Age of Onset  . Diabetes Maternal Aunt   . Breast cancer Maternal Aunt     2 maternal aunts with breast cancer diagnosed at 17 and 32 yo  .  Cancer Maternal Grandfather     lung  . Colon cancer Maternal Grandfather     died in his 73's.     Social History  Substance Use Topics  . Smoking status: Never Smoker   . Smokeless tobacco: Never Used  . Alcohol Use: Yes     Comment: occasional, social drinking    Allergies:  Allergies  Allergen Reactions  . Codeine Nausea And Vomiting    Prescriptions prior to admission  Medication Sig Dispense Refill Last Dose  . cetirizine (ZYRTEC) 10 MG tablet Take 1 tablet (10 mg total) by mouth daily. 30 tablet 11   . FLUoxetine (PROZAC) 20 MG tablet Take 1 tablet (20 mg total) by mouth daily. 30 tablet 3   . fluticasone (FLONASE) 50 MCG/ACT nasal spray Place 2 sprays into both nostrils daily. 16 g 0   . metroNIDAZOLE (FLAGYL) 500 MG tablet Take 1 tablet (500 mg total) by mouth 2 (two) times daily. Do not drink alcohol with this medication. 14 tablet 0   . OLANZapine (ZYPREXA) 5 MG tablet Take 1 tablet (5 mg [id number] by mouth at bedtime. 30 tablet 1   . polyethylene glycol powder (GLYCOLAX/MIRALAX) powder Take 17 g by mouth 2 (two) times daily as needed. 3350 g 1     Review of  Systems  Constitutional: Negative.   Gastrointestinal: Positive for nausea and abdominal pain. Negative for vomiting, diarrhea and constipation.  Genitourinary: Negative for dysuria.       + vaginal discharge No vaginal bleeding   Physical Exam   Blood pressure 123/82, pulse 70, temperature 98.3 F (36.8 C), temperature source Oral, resp. rate 18, height  (1.676 m), weight 248 lb 6.4 oz (112.674 kg), last menstrual period 12/31/2014.  Physical Exam  Nursing note and vitals reviewed. Constitutional: She is oriented to person, place, and time. She appears well-developed and well-nourished. No distress.  HENT:  Head: Normocephalic and atraumatic.  Eyes: Conjunctivae are normal. Right eye exhibits no discharge. Left eye exhibits no discharge. No scleral icterus.  Neck: Normal range of motion.   Cardiovascular: Normal rate, regular rhythm and normal heart sounds.   No murmur heard. Respiratory: Effort normal and breath sounds normal. No respiratory distress. She has no wheezes.  GI: Soft. Bowel sounds are normal. She exhibits no distension and no mass. There is no tenderness. There is no rebound and no guarding.  Genitourinary: Vagina normal and uterus normal. Cervix exhibits discharge (minimal amount of clear mucoid discharge). Cervix exhibits no motion tenderness and no friability. Right adnexum displays no mass, no tenderness and no fullness. Left adnexum displays no mass, no tenderness and no fullness.  Neurological: She is alert and oriented to person, place, and time.  Skin: Skin is warm and dry. She is not diaphoretic.  Psychiatric: She has a normal mood and affect. Her behavior is normal. Judgment and thought content normal.    MAU Course  Procedures Results for orders placed or performed during the hospital encounter of 01/26/15 (from the past 24 hour(s))  Urinalysis, Routine w reflex microscopic (not at [facility])     Status: None   Collection Time: 01/26/15  3:15 PM  Result Value Ref Range   Color, Urine YELLOW YELLOW   APPearance CLEAR CLEAR   Specific Gravity, Urine 1.025 1.005 - 1.030   pH 5.5 5.0 - 8.0   Glucose, UA NEGATIVE NEGATIVE mg/dL   Hgb urine dipstick NEGATIVE NEGATIVE   Bilirubin Urine NEGATIVE NEGATIVE   Ketones, ur NEGATIVE NEGATIVE mg/dL   Protein, ur NEGATIVE NEGATIVE mg/dL   Nitrite NEGATIVE NEGATIVE   Leukocytes, UA NEGATIVE NEGATIVE  Pregnancy, urine POC     Status: None   Collection Time: 01/26/15  3:25 PM  Result Value Ref Range   Preg Test, Ur NEGATIVE NEGATIVE  CBC     Status: Abnormal   Collection Time: 01/26/15  4:00 PM  Result Value Ref Range   WBC 5.6 4.0 - 10.5 K/uL   RBC 4.50 3.87 - 5.11 MIL/uL   Hemoglobin 10.8 (L) 12.0 - 15.0 g/dL   HCT 16.1 (L) 09.6 - 04.5 %   MCV 74.7 (L) 78.0 - 100.0 fL   MCH 24.0 (L) 26.0 - 34.0 pg   MCHC  32.1 30.0 - 36.0 g/dL   RDW 40.9 (H) 81.1 - 91.4 %   Platelets 230 150 - 400 K/uL  hCG, quantitative, pregnancy     Status: None   Collection Time: 01/26/15  4:04 PM  Result Value Ref Range   hCG, Beta Chain, Quant, S <1 <5 mIU/mL  Wet prep, genital     Status: Abnormal   Collection Time: 01/26/15  5:48 PM  Result Value Ref Range   Yeast Wet Prep HPF POC NONE SEEN NONE SEEN   Trich, Wet Prep NONE SEEN NONE SEEN  Clue Cells Wet Prep HPF POC PRESENT (A) NONE SEEN   WBC, Wet Prep HPF POC FEW (A) NONE SEEN   Sperm NONE SEEN     MDM UPT negative Pt insistent on blood pregnancy test - states her urine never shows up positive BHCG negative Toradol IM given  Assessment and Plan  A: 1. Abdominal cramping   2. Negative pregnancy test   3. Non-intractable vomiting with nausea, unspecified vomiting type   4. BV (bacterial vaginosis)    P: Discharge home F/u with PCP if symptoms don't improve Rx phenergan, ibuprofen, & flagyl Can take home pregnancy test if misses menses  Judeth HornErin Edyn Qazi, NP  01/26/2015, 3:46 PM

## 2015-01-26 NOTE — Discharge Instructions (Signed)
Abdominal Pain, Adult Many things can cause abdominal pain. Usually, abdominal pain is not caused by a disease and will improve without treatment. It can often be observed and treated at home. Your health care provider will do a physical exam and possibly order blood tests and X-rays to help determine the seriousness of your pain. However, in many cases, more time must pass before a clear cause of the pain can be found. Before that point, your health care provider may not know if you need more testing or further treatment. HOME CARE INSTRUCTIONS Monitor your abdominal pain for any changes. The following actions may help to alleviate any discomfort you are experiencing:  Only take over-the-counter or prescription medicines as directed by your health care provider.  Do not take laxatives unless directed to do so by your health care provider.  Try a clear liquid diet (broth, tea, or water) as directed by your health care provider. Slowly move to a bland diet as tolerated. SEEK MEDICAL CARE IF:  You have unexplained abdominal pain.  You have abdominal pain associated with nausea or diarrhea.  You have pain when you urinate or have a bowel movement.  You experience abdominal pain that wakes you in the night.  You have abdominal pain that is worsened or improved by eating food.  You have abdominal pain that is worsened with eating fatty foods.  You have a fever. SEEK IMMEDIATE MEDICAL CARE IF:  Your pain does not go away within 2 hours.  You keep throwing up (vomiting).  Your pain is felt only in portions of the abdomen, such as the right side or the left lower portion of the abdomen.  You pass bloody or black tarry stools. MAKE SURE YOU:  Understand these instructions.  Will watch your condition.  Will get help right away if you are not doing well or get worse.   This information is not intended to replace advice given to you by your health care provider. Make sure you discuss  any questions you have with your health care provider.   Document Released: 10/27/2004 Document Revised: 10/08/2014 Document Reviewed: 09/26/2012 Elsevier Interactive Patient Education 2016 [employer] Pregnancy Test Information WHAT IS A PREGNANCY TEST? A pregnancy test is used to detect the presence of human chorionic gonadotropin (hCG) in a sample of your urine or blood. hCG is a hormone produced by the cells of the placenta. The placenta is the organ that forms to nourish and support a developing baby. This test requires a sample of either blood or urine. A pregnancy test determines whether you are pregnant or not. HOW ARE PREGNANCY TESTS DONE? Pregnancy tests are done using a home pregnancy test or having a blood or urine test done at your health care provider's office.  Home pregnancy tests require a urine sample.  Most kits use a plastic testing device with a strip of paper that indicates whether there is hCG in your urine.  Follow the test instructions very carefully.  After you urinate on the test stick, markings will appear to let you know whether you are pregnant.  For best results, use your first urine of the morning. That is when the concentration of hCG is highest. Having a blood test to check for pregnancy requires a sample of blood drawn from a vein in your hand or arm. Your health care provider will send your sample to a lab for testing. Results of a pregnancy test will be positive or negative. IS ONE TYPE OF PREGNANCY  TEST BETTER THAN ANOTHER? In some cases, a blood test will return a positive result even if a urine test was negative because blood tests are more sensitive. This means blood tests can detect hCG earlier than home pregnancy tests.  HOW ACCURATE ARE HOME PREGNANCY TESTS?  Both types of pregnancy tests are very accurate.  A blood test is about 98% accurate.  When you are far enough along in your pregnancy and when used correctly, home pregnancy tests are  equally accurate. CAN ANYTHING INTERFERE WITH HOME PREGNANCY TEST RESULTS?  It is possible for certain conditions to cause an inaccurate test result (false positive or falsenegative).  A false positive is a positive test result when you are not pregnant. This can happen if you:  Are taking certain medicines, including anticonvulsants or tranquilizers.  Have certain proteins in your blood.  A false negative is a negative test result when you are pregnant. This can happen if you:  Took the test before there was enough hCG to detect. A pregnancy test will not be positive in most women until 3-4 weeks after conception.  Drank a lot of liquid before the test. Diluted urine samples can sometimes give an inaccurate result.  Take certain medicines, such as water pills (diuretics) or some antihistamines. WHAT SHOULD I DO IF I HAVE A POSITIVE PREGNANCY TEST? If you have a positive pregnancy test, schedule an appointment with your health care provider. You might need additional testing to confirm the pregnancy. In the meantime, begin taking a prenatal vitamin, stop smoking, stop drinking alcohol, and do not use street drugs. Talk to your health care provider about how to take care of yourself during your pregnancy. Ask about what to expect from the care you will need throughout pregnancy (prenatal care).   This information is not intended to replace advice given to you by your health care provider. Make sure you discuss any questions you have with your health care provider.   Document Released: 01/20/2003 Document Revised: 02/07/2014 Document Reviewed: 05/14/2013 Elsevier Interactive Patient Education [employer]

## 2015-01-26 NOTE — MAU Note (Addendum)
Has been having cramping and pain. .  Had preg symptoms last week, though has had 3 neg preg.  Has not missed cycle. Is convinced something must be wrong because of how she feels

## 2015-06-30 ENCOUNTER — Inpatient Hospital Stay (HOSPITAL_COMMUNITY)
Admission: AD | Admit: 2015-06-30 | Discharge: 2015-06-30 | Disposition: A | Discharge status: Home / Self Care | Payer: Medicaid Other | Source: Ambulatory Visit | Attending: Obstetrics | Admitting: Obstetrics

## 2015-06-30 ENCOUNTER — Inpatient Hospital Stay (HOSPITAL_COMMUNITY): Payer: Medicaid Other

## 2015-06-30 ENCOUNTER — Encounter (HOSPITAL_COMMUNITY): Payer: Self-pay | Admitting: *Deleted

## 2015-06-30 DIAGNOSIS — M5432 Sciatica, left side: Secondary | ICD-10-CM | POA: Insufficient documentation

## 2015-06-30 DIAGNOSIS — Z885 Allergy status to narcotic agent status: Secondary | ICD-10-CM | POA: Insufficient documentation

## 2015-06-30 DIAGNOSIS — O9989 Other specified diseases and conditions complicating pregnancy, childbirth and the puerperium: Secondary | ICD-10-CM | POA: Diagnosis not present

## 2015-06-30 DIAGNOSIS — Z3A01 Less than 8 weeks gestation of pregnancy: Secondary | ICD-10-CM | POA: Insufficient documentation

## 2015-06-30 DIAGNOSIS — O26891 Other specified pregnancy related conditions, first trimester: Secondary | ICD-10-CM | POA: Diagnosis not present

## 2015-06-30 DIAGNOSIS — R1032 Left lower quadrant pain: Secondary | ICD-10-CM

## 2015-06-30 DIAGNOSIS — M25552 Pain in left hip: Secondary | ICD-10-CM | POA: Diagnosis present

## 2015-06-30 DIAGNOSIS — O219 Vomiting of pregnancy, unspecified: Secondary | ICD-10-CM | POA: Insufficient documentation

## 2015-06-30 DIAGNOSIS — O3680X0 Pregnancy with inconclusive fetal viability, not applicable or unspecified: Secondary | ICD-10-CM

## 2015-06-30 LAB — URINALYSIS, ROUTINE W REFLEX MICROSCOPIC
BILIRUBIN URINE: NEGATIVE
Glucose, UA: NEGATIVE mg/dL
Hgb urine dipstick: NEGATIVE
Ketones, ur: NEGATIVE mg/dL
Leukocytes, UA: NEGATIVE
NITRITE: NEGATIVE
PH: 5.5 (ref 5.0–8.0)
Protein, ur: NEGATIVE mg/dL
SPECIFIC GRAVITY, URINE: 1.025 (ref 1.005–1.030)

## 2015-06-30 LAB — CBC
HEMATOCRIT: 30.7 % — AB (ref 36.0–46.0)
Hemoglobin: 10.2 g/dL — ABNORMAL LOW (ref 12.0–15.0)
MCH: 24.9 pg — ABNORMAL LOW (ref 26.0–34.0)
MCHC: 33.2 g/dL (ref 30.0–36.0)
MCV: 75.1 fL — AB (ref 78.0–100.0)
Platelets: 233 10*3/uL (ref 150–400)
RBC: 4.09 MIL/uL (ref 3.87–5.11)
RDW: 14.1 % (ref 11.5–15.5)
WBC: 8.6 10*3/uL (ref 4.0–10.5)

## 2015-06-30 LAB — HCG, QUANTITATIVE, PREGNANCY: hCG, Beta Chain, Quant, S: 983 m[IU]/mL — ABNORMAL HIGH (ref <5)

## 2015-06-30 LAB — POCT PREGNANCY, URINE: PREG TEST UR: POSITIVE — AB

## 2015-06-30 MED ORDER — PROMETHAZINE HCL 12.5 MG PO TABS: 12.5000 mg | ORAL_TABLET | Freq: Four times a day (QID) | ORAL | Status: DC | PRN

## 2015-06-30 MED ORDER — ACETAMINOPHEN 325 MG PO TABS
650.0000 mg | ORAL_TABLET | Freq: Once | ORAL | Status: AC
  Administered 2015-06-30: 650 mg via ORAL
  Filled 2015-06-30: qty 2

## 2015-06-30 NOTE — MAU Provider Note (Signed)
CSN: [id number]     Arrival date & time 06/30/15  1025 History   None    Chief Complaint  Patient presents with  . Hip Pain     (Consider location/radiation/quality/duration/timing/severity/associated sxs/prior Treatment) HPI [name] is a 35 y.o. [id number] @ [id number] gestation who presents to the ED with hip pain. Patient reports that she started having left hip pain one week ago. She does not remember any injury. She states that 4 years ago she had a problem after delivery due to pushing so hard and had an MRI that showed a disc that was injured. She went for PT after that and it got better and she has had not problems until last week.  Past Medical History  Diagnosis Date  . 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 Heart murmur   . Abortion    Past Surgical History  Procedure Laterality Date  . Breast surgery  2005    L breast infection after loss  . Cesarean section  02/24/2011    Procedure: CESAREAN SECTION;  Surgeon: Brock Badharles A Harper, MD;  Location: [facility];  Service: Gynecology;  Laterality: N/A;  Primary, cord ph 7.29   Family History  Problem Relation Age of Onset  . Diabetes Maternal Aunt   . Breast cancer Maternal Aunt     2 maternal aunts with breast cancer diagnosed at 7430 and 33 yo  . Cancer Maternal Grandfather     lung  . Colon cancer Maternal Grandfather     died in his 6560's.    Social History  Substance Use Topics  . Smoking status: Never Smoker   . Smokeless tobacco: Never Used  . Alcohol Use: Yes     Comment: occasional, social drinking   OB History    Gravida Para Term Preterm AB TAB SAB Ectopic Multiple Living   7 3 3  0 3 1 2  0 0 2     Review of Systems  Gastrointestinal: Positive for nausea, vomiting and abdominal pain (left lower).  Musculoskeletal: Positive for back pain and arthralgias.       Left hip pain  all other systems negative    Allergies  Codeine  Home Medications   Prior to Admission medications   Medication Sig Start Date End Date  Taking? Authorizing Provider  promethazine (PHENERGAN) 12.5 MG tablet Take 1 tablet (12.5 mg total) by mouth every 6 (six) hours as needed for nausea or vomiting. 06/30/15   Purl Claytor Orlene OchM Jerell Demery, NP   BP 102/74 mmHg  Pulse 87  Temp(Src) 98.3 F (36.8 C)  Resp 18  Wt 198 lb (89.812 kg)  LMP 05/27/2015 Physical Exam  Constitutional: She is oriented to person, place, and time. She appears well-developed and well-nourished. No distress.  HENT:  Head: Normocephalic and atraumatic.  Right Ear: Tympanic membrane normal.  Left Ear: Tympanic membrane normal.  Nose: Nose normal.  Mouth/Throat: Uvula is midline, oropharynx is clear and moist and mucous membranes are normal.  Eyes: Conjunctivae and EOM are normal.  Neck: Normal range of motion. Neck supple.  Cardiovascular: Normal rate and regular rhythm.   Pulmonary/Chest: Effort normal. She has no wheezes. She has no rales.  Abdominal: Soft. Bowel sounds are normal. There is tenderness in the left lower quadrant. There is no rebound, no guarding and no CVA tenderness.  Musculoskeletal: Normal range of motion.       Lumbar back: She exhibits tenderness, pain and spasm. She exhibits normal pulse.  Neurological: She is  alert and oriented to person, place, and time. She has normal strength. No cranial nerve deficit or sensory deficit. Gait normal.  Reflex Scores:      Bicep reflexes are 2+ on the right side and 2+ on the left side.      Brachioradialis reflexes are 2+ on the right side and 2+ on the left side.      Patellar reflexes are 2+ on the right side and 2+ on the left side.      Achilles reflexes are 2+ on the right side and 2+ on the left side. Skin: Skin is warm and dry.  Psychiatric: She has a normal mood and affect. Her behavior is normal.  Nursing note and vitals reviewed.   ED Course  Procedures (including critical care time) Labs Review Labs Reviewed  CBC - Abnormal; Notable for the following:    Hemoglobin 10.2 (*)    HCT 30.7 (*)     MCV 75.1 (*)    MCH 24.9 (*)    All other components within normal limits  HCG, QUANTITATIVE, PREGNANCY - Abnormal; Notable for the following:    hCG, Beta Chain, Quant, S 983 (*)    All other components within normal limits  POCT PREGNANCY, URINE - Abnormal; Notable for the following:    Preg Test, Ur POSITIVE (*)    All other components within normal limits  URINALYSIS, ROUTINE W REFLEX MICROSCOPIC (NOT AT [facility])    Imaging Review US Ob Comp Less 14 Wks  06/30/2015  CLINICAL DATA:  Pregnancy.  Pain. EXAM: OBSTETRIC <14 WK [place] AND TRANSVAGINAL OB US TECHNIQUE: Both transabdominal and transvaginal ultrasound examinations were performed for complete evaluation of the gestation as well as the maternal uterus, adnexal regions, and pelvic cul-de-sac. Transvaginal technique was performed to assess early pregnancy. COMPARISON:  08/17/2010 report. FINDINGS: Intrauterine gestational sac: Single. Yolk sac:  None. Embryo:  None. Cardiac Activity: None. MSD: 4.2  mm   5 W   1  d Subchorionic hemorrhage:  None visualized. Maternal uterus/adnexae: Retroflexed uterus. IMPRESSION: Probable early intrauterine gestational sac, but no yolk sac, fetal pole, or cardiac activity yet visualized. Recommend follow-up quantitative B-HCG levels and follow-up US in 14 days to confirm and assess viability. This recommendation follows SRU consensus guidelines: Diagnostic Criteria for Nonviable Pregnancy Early in the First Trimester. Malva Limes Med 2013; 161:0960-45. Electronically Signed   By: Maisie Fus  Register   On: 06/30/2015 13:08   US Ob Transvaginal  06/30/2015  CLINICAL DATA:  Pregnancy.  Pain. EXAM: OBSTETRIC <14 WK [place] AND TRANSVAGINAL OB US TECHNIQUE: Both transabdominal and transvaginal ultrasound examinations were performed for complete evaluation of the gestation as well as the maternal uterus, adnexal regions, and pelvic cul-de-sac. Transvaginal technique was performed to assess early pregnancy. COMPARISON:   08/17/2010 report. FINDINGS: Intrauterine gestational sac: Single. Yolk sac:  None. Embryo:  None. Cardiac Activity: None. MSD: 4.2  mm   5 W   1  d Subchorionic hemorrhage:  None visualized. Maternal uterus/adnexae: Retroflexed uterus. IMPRESSION: Probable early intrauterine gestational sac, but no yolk sac, fetal pole, or cardiac activity yet visualized. Recommend follow-up quantitative B-HCG levels and follow-up US in 14 days to confirm and assess viability. This recommendation follows SRU consensus guidelines: Diagnostic Criteria for Nonviable Pregnancy Early in the First Trimester. Malva Limes Med 2013; 409:8119-14. Electronically Signed   By: Maisie Fus  Register   On: 06/30/2015 13:08   I have personally reviewed and evaluated these images and lab results as part  of my medical decision-making.  Patient to f/u with GYN Clinic in 48 hours and repeat ultrasound in one week. She will return here sooner for any problems.   MDM  35 y.o. [id number] @ [redacted]w[redacted]d here today for LLQ abdominal pain stable for d/c without vaginal bleeding and no ectopic, torsion or other abnormality identified on u/s. Will treat for sciatic and patient will f/u in Clinic in 2 days for Bhcg and for u/s in one week. Discussed with the patient and all questioned fully answered. She will return here if any problems arise.   Final diagnoses:  Pregnancy of unknown anatomic location  Nausea and vomiting in pregnancy  Sciatica, left

## 2015-06-30 NOTE — Discharge Instructions (Signed)
Take tylenol for the pain, take the medication for nausea as directed. You will need to follow up in the OB [facility] in 2 days at 11 am to repeat the pregnancy hormone level to be sure it is rising normally. The ultrasound department will call you to schedule follow up ultrasound.

## 2015-06-30 NOTE — Progress Notes (Signed)
Written and verbal d/c instructions given and understanding voiced. 

## 2015-06-30 NOTE — MAU Note (Signed)
Pt presents to MAU with complaints of pain in her left hip. + pregnancy test last night. Denies any vaginal bleeding or abnormal discharge

## 2015-07-01 ENCOUNTER — Ambulatory Visit (HOSPITAL_COMMUNITY)
Admission: RE | Admit: 2015-07-01 | Discharge: 2015-07-01 | Disposition: A | Discharge status: Home / Self Care | Payer: Medicaid Other | Source: Ambulatory Visit | Attending: Advanced Practice Midwife | Admitting: Advanced Practice Midwife

## 2015-07-01 DIAGNOSIS — R1032 Left lower quadrant pain: Secondary | ICD-10-CM

## 2015-07-01 DIAGNOSIS — O3680X0 Pregnancy with inconclusive fetal viability, not applicable or unspecified: Secondary | ICD-10-CM

## 2015-07-02 ENCOUNTER — Telehealth: Payer: Self-pay | Admitting: General Practice

## 2015-07-02 ENCOUNTER — Other Ambulatory Visit: Payer: Medicaid Other

## 2015-07-02 NOTE — Telephone Encounter (Signed)
Patient missed stat bhcg today. Called patient and she states she was just here at the hospital all day and didn't feel like coming back. Emphasized importance of returning for repeat labs. Patient is agreeable to come tomorrow at 9am. Patient had no questions

## 2015-07-03 ENCOUNTER — Other Ambulatory Visit: Payer: Self-pay

## 2015-07-03 DIAGNOSIS — O3680X0 Pregnancy with inconclusive fetal viability, not applicable or unspecified: Secondary | ICD-10-CM

## 2015-07-03 LAB — HCG, QUANTITATIVE, PREGNANCY: HCG, BETA CHAIN, QUANT, S: 3046 m[IU]/mL — AB (ref <5)

## 2015-07-03 NOTE — Progress Notes (Signed)
Pt presented today for a repeat quant. Pt stated she was having some mild pain but otherwise feeling ok. Pt has been informed she will need to wait for results to come back. Pt verbalizes understanding.

## 2015-07-03 NOTE — Patient Instructions (Signed)
Pt presented to clinic today for a repeat quant which is 3046. I have consulted with Dr.Pickens to go ahead and get and U/S. This was already scheduled for 07/09/2015 at 1:00pm. Pt verbalize understanding and will follow up next week.

## 2015-07-09 ENCOUNTER — Ambulatory Visit (HOSPITAL_COMMUNITY)
Admission: RE | Admit: 2015-07-09 | Discharge: 2015-07-09 | Disposition: A | Discharge status: Home / Self Care | Payer: Medicaid Other | Source: Ambulatory Visit | Attending: Advanced Practice Midwife | Admitting: Advanced Practice Midwife

## 2015-07-09 ENCOUNTER — Telehealth: Payer: Self-pay | Admitting: *Deleted

## 2015-07-09 DIAGNOSIS — Z3A01 Less than 8 weeks gestation of pregnancy: Secondary | ICD-10-CM | POA: Insufficient documentation

## 2015-07-09 DIAGNOSIS — Z36 Encounter for antenatal screening of mother: Secondary | ICD-10-CM | POA: Insufficient documentation

## 2015-07-09 NOTE — Telephone Encounter (Signed)
Ultrasound called to inform us that they didn't realize patient needed to come to clinic for results after ultrasound. I reviewed results with Erika FanningJulie, pt has a viable pregnancy and will need to start prenatal care. Called patient and informed her. Message to front desk to schedule new ob.

## 2015-07-20 ENCOUNTER — Inpatient Hospital Stay (HOSPITAL_COMMUNITY)
Admission: AD | Admit: 2015-07-20 | Discharge: 2015-07-20 | Disposition: A | Discharge status: Home / Self Care | Payer: Medicaid Other | Source: Ambulatory Visit | Attending: Obstetrics & Gynecology | Admitting: Obstetrics & Gynecology

## 2015-07-20 ENCOUNTER — Encounter (HOSPITAL_COMMUNITY): Payer: Self-pay | Admitting: *Deleted

## 2015-07-20 DIAGNOSIS — N930 Postcoital and contact bleeding: Secondary | ICD-10-CM

## 2015-07-20 DIAGNOSIS — Z3A01 Less than 8 weeks gestation of pregnancy: Secondary | ICD-10-CM | POA: Diagnosis not present

## 2015-07-20 DIAGNOSIS — Z885 Allergy status to narcotic agent status: Secondary | ICD-10-CM | POA: Diagnosis not present

## 2015-07-20 DIAGNOSIS — O209 Hemorrhage in early pregnancy, unspecified: Secondary | ICD-10-CM | POA: Diagnosis not present

## 2015-07-20 DIAGNOSIS — N939 Abnormal uterine and vaginal bleeding, unspecified: Secondary | ICD-10-CM | POA: Diagnosis present

## 2015-07-20 LAB — WET PREP, GENITAL
Sperm: NONE SEEN
TRICH WET PREP: NONE SEEN
YEAST WET PREP: NONE SEEN

## 2015-07-20 LAB — URINALYSIS, ROUTINE W REFLEX MICROSCOPIC
BILIRUBIN URINE: NEGATIVE
Glucose, UA: NEGATIVE mg/dL
KETONES UR: NEGATIVE mg/dL
NITRITE: NEGATIVE
Protein, ur: NEGATIVE mg/dL
SPECIFIC GRAVITY, URINE: 1.025 (ref 1.005–1.030)
pH: 5.5 (ref 5.0–8.0)

## 2015-07-20 LAB — URINE MICROSCOPIC-ADD ON: BACTERIA UA: NONE SEEN

## 2015-07-20 NOTE — Discharge Instructions (Signed)
Pelvic Rest °Pelvic rest is sometimes recommended for women when:  °· The placenta is partially or completely covering the opening of the cervix (placenta previa). °· There is bleeding between the uterine wall and the amniotic sac in the first trimester (subchorionic hemorrhage). °· The cervix begins to open without labor starting (incompetent cervix, cervical insufficiency). °· The labor is too early (preterm labor). °HOME CARE INSTRUCTIONS °· Do not have sexual intercourse, stimulation, or an orgasm. °· Do not use tampons, douche, or put anything in the vagina. °· Do not lift anything over 10 pounds (4.5 kg). °· Avoid strenuous activity or straining your pelvic muscles. °SEEK MEDICAL CARE IF:  °· You have any vaginal bleeding during pregnancy. Treat this as a potential emergency. °· You have cramping pain felt low in the stomach (stronger than menstrual cramps). °· You notice vaginal discharge (watery, mucus, or bloody). °· You have a low, dull backache. °· There are regular contractions or uterine tightening. °SEEK IMMEDIATE MEDICAL CARE IF: °You have vaginal bleeding and have placenta previa.  °  °This information is not intended to replace advice given to you by your health care provider. Make sure you discuss any questions you have with your health care provider. °  °Document Released: 05/14/2010 Document Revised: 04/11/2011 Document Reviewed: 07/21/2014 °Elsevier Interactive Patient Education ©2016 Elsevier Inc. ° °

## 2015-07-20 NOTE — MAU Note (Signed)
Little cramping and bleeding after the weekend, was moving and had intercourse.  Bleeding started after intercourse, no longer

## 2015-07-20 NOTE — MAU Provider Note (Signed)
History     CSN: [id number]  Arrival date and time: 07/20/15 1654   First Provider Initiated Contact with Patient 07/20/15 1832      Chief Complaint  Patient presents with  . Vaginal Bleeding  . Abdominal Pain   HPI    [name] is a 35 y.o. female [phone] @ [redacted]w[redacted]d who presents today with vaginal bleeding x 1 day. Patient states that the bleeding began following intercourse with her partner last night. Bright red blood appeared on the toilet tissue with wiping after intercourse, followed by 4-5 drops of blood in the toilet bowl. She notes that the bleeding appears to be slowing, as only slight streaks of pink appear on the toilet tissue today. Patient endorses 2/10 cramping x 1 week that was associated with some back cramping last night. She has not experienced this in any of her previous pregnancies. Patient had a vaginal [place] on 6/8 that showed a viable IUP. Patient endorses dizziness and mild nausea, no emesis. Endorses DOE and seemingly random bouts of her heart racing.    OB History    Gravida Para Term Preterm AB TAB SAB Ectopic Multiple Living   0 0 0 2      Past Medical History  Diagnosis Date  . 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 Heart murmur   . Abortion     Past Surgical History  Procedure Laterality Date  . Breast surgery  2005    L breast infection after loss  . Cesarean section  02/24/2011    Procedure: CESAREAN SECTION;  Surgeon: Brock Bad, MD;  Location: [facility];  Service: Gynecology;  Laterality: N/A;  Primary, cord ph 7.29    Family History  Problem Relation Age of Onset  . Diabetes Maternal Aunt   . Breast cancer Maternal Aunt     2 maternal aunts with breast cancer diagnosed at 57 and 33 yo  . Cancer Maternal Grandfather     lung  . Colon cancer Maternal Grandfather     died in his 72's.     Social History  Substance Use Topics  . Smoking status: Never Smoker   . Smokeless tobacco: Never Used  . Alcohol Use: Yes      Comment: occasional, social drinking    Allergies:  Allergies  Allergen Reactions  . Codeine Nausea And Vomiting    Prescriptions prior to admission  Medication Sig Dispense Refill Last Dose  . acetaminophen (TYLENOL) 500 MG tablet Take 1,000 mg by mouth every 6 (six) hours as needed for mild pain or headache.   07/20/2015 at Unknown time  . Prenatal Vit-Fe Fumarate-FA (PRENATAL MULTIVITAMIN) TABS tablet Take 1 tablet by mouth daily at 12 noon.   07/19/2015 at Unknown time  . promethazine (PHENERGAN) 12.5 MG tablet Take 1 tablet (12.5 mg total) by mouth every 6 (six) hours as needed for nausea or vomiting. (Patient not taking: Reported on 07/20/2015) 30 tablet 0 Not Taking at Unknown time   Results for orders placed or performed during the hospital encounter of 07/20/15 (from the past 48 hour(s))  Urinalysis, Routine w reflex microscopic (not at [facility])     Status: Abnormal   Collection Time: 07/20/15  5:35 PM  Result Value Ref Range   Color, Urine YELLOW YELLOW   APPearance CLEAR CLEAR   Specific Gravity, Urine 1.025 1.005 - 1.030   pH 5.5 5.0 - 8.0   Glucose, UA NEGATIVE  NEGATIVE mg/dL   Hgb urine dipstick TRACE (A) NEGATIVE   Bilirubin Urine NEGATIVE NEGATIVE   Ketones, ur NEGATIVE NEGATIVE mg/dL   Protein, ur NEGATIVE NEGATIVE mg/dL   Nitrite NEGATIVE NEGATIVE   Leukocytes, UA TRACE (A) NEGATIVE  Urine microscopic-add on     Status: Abnormal   Collection Time: 07/20/15  5:35 PM  Result Value Ref Range   Squamous Epithelial / LPF 0-5 (A) NONE SEEN   WBC, UA 0-5 0 - 5 WBC/hpf   RBC / HPF 0-5 0 - 5 RBC/hpf   Bacteria, UA NONE SEEN NONE SEEN  Wet prep, genital     Status: Abnormal   Collection Time: 07/20/15  6:53 PM  Result Value Ref Range   Yeast Wet Prep HPF POC NONE SEEN NONE SEEN   Trich, Wet Prep NONE SEEN NONE SEEN   Clue Cells Wet Prep HPF POC PRESENT (A) NONE SEEN   WBC, Wet Prep HPF POC MODERATE (A) NONE SEEN    Comment: MANY BACTERIA SEEN   Sperm NONE SEEN      Review of Systems  Constitutional: Negative for fever and chills.  Gastrointestinal: Negative for nausea, vomiting and abdominal pain.  Genitourinary: Negative for dysuria, urgency and frequency.   Physical Exam   Pulse 76, temperature 98.5 F (36.9 C), temperature source Oral, resp. rate 18, weight 244 lb 0.6 oz (110.696 kg), last menstrual period 05/27/2015.  Physical Exam  Constitutional: She is oriented to person, place, and time. She appears well-developed and well-nourished. No distress.  HENT:  Head: Normocephalic.  Respiratory: Effort normal.  Genitourinary:  Speculum exam: Vagina - Small amount of creamy discharge, no odor Cervix - No contact bleeding, no active bleeding  Bimanual exam: Cervix closed Uterus non tender, enlarged  Adnexa non tender, no masses bilaterally GC/Chlam, wet prep done Chaperone present for exam.  Musculoskeletal: Normal range of motion.  Neurological: She is alert and oriented to person, place, and time.  Skin: Skin is warm. She is not diaphoretic.  Psychiatric: Her behavior is normal.    MAU Course  Procedures  None  MDM  B positive blood type  Beside US confirmed active fetus with fetal heart rate. US done by Dr. Tawanna SatWoulk  Clue cells on wet prep; patient is asymptomatic   Assessment and Plan    A:  1. PCB (post coital bleeding)     P:  Discharge home in stable condition Pelvic rest if bleeding Start prenatal care ASAP Return to MAU if symptoms worsen    Duane LopeJennifer I Rasch, NP 07/20/2015 7:36 PM

## 2015-07-21 LAB — GC/CHLAMYDIA PROBE AMP (~~LOC~~) NOT AT ARMC
CHLAMYDIA, DNA PROBE: NEGATIVE
Neisseria Gonorrhea: NEGATIVE

## 2015-08-13 ENCOUNTER — Encounter: Payer: Medicaid Other | Admitting: Obstetrics and Gynecology

## 2015-08-17 ENCOUNTER — Ambulatory Visit (INDEPENDENT_AMBULATORY_CARE_PROVIDER_SITE_OTHER): Payer: Medicaid Other | Admitting: Obstetrics & Gynecology

## 2015-08-17 ENCOUNTER — Encounter: Payer: Self-pay | Admitting: Obstetrics & Gynecology

## 2015-08-17 VITALS — BP 120/71 | HR 85 | Wt 252.0 lb

## 2015-08-17 DIAGNOSIS — O99211 Obesity complicating pregnancy, first trimester: Secondary | ICD-10-CM

## 2015-08-17 DIAGNOSIS — O26891 Other specified pregnancy related conditions, first trimester: Secondary | ICD-10-CM | POA: Diagnosis not present

## 2015-08-17 DIAGNOSIS — O34219 Maternal care for unspecified type scar from previous cesarean delivery: Secondary | ICD-10-CM | POA: Diagnosis not present

## 2015-08-17 DIAGNOSIS — R519 Headache, unspecified: Secondary | ICD-10-CM | POA: Insufficient documentation

## 2015-08-17 DIAGNOSIS — R51 Headache: Secondary | ICD-10-CM

## 2015-08-17 DIAGNOSIS — Z3490 Encounter for supervision of normal pregnancy, unspecified, unspecified trimester: Secondary | ICD-10-CM | POA: Insufficient documentation

## 2015-08-17 DIAGNOSIS — Z3491 Encounter for supervision of normal pregnancy, unspecified, first trimester: Secondary | ICD-10-CM | POA: Diagnosis not present

## 2015-08-17 DIAGNOSIS — O99210 Obesity complicating pregnancy, unspecified trimester: Secondary | ICD-10-CM | POA: Insufficient documentation

## 2015-08-17 DIAGNOSIS — O26899 Other specified pregnancy related conditions, unspecified trimester: Secondary | ICD-10-CM | POA: Insufficient documentation

## 2015-08-17 MED ORDER — BUTALBITAL-APAP-CAFFEINE 50-325-40 MG PO CAPS: 1.0000 | ORAL_CAPSULE | Freq: Four times a day (QID) | ORAL | Status: DC | PRN

## 2015-08-17 NOTE — Patient Instructions (Signed)
Thank you for enrolling in MyChart. Please follow the instructions below to securely access your online medical record. MyChart allows you to send messages to your doctor, view your test results, renew your prescriptions, schedule appointments, and more.  How Do I Sign Up? 1. In your Internet browser, go to [email or web address] 2. Click on the New  User? link in the Sign In box.  3. Enter your MyChart Access Code exactly as it appears below. You will not need to use this code after you have completed the sign-up process. If you do not sign up before the expiration date, you must request a new code. MyChart Access Code: [email or web address] Expires: 08/29/2015 10:57 AM  4. Enter the last four digits of your Social Security Number (xxxx) and Date of Birth (mm/dd/yyyy) as indicated and click Next. You will be taken to the next sign-up page. 5. Create a MyChart ID. This will be your MyChart login ID and cannot be changed, so think of one that is secure and easy to remember. 6. Create a MyChart password. You can change your password at any time. 7. Enter your Password Reset Question and Answer and click Next. This can be used at a later time if you forget your password.  8. Select your communication preference, and if applicable enter your e-mail address. You will receive e-mail notification when new information is available in MyChart by choosing to receive e-mail notifications and filling in your e-mail. 9. Click Sign In. You can now view your medical record.   Additional Information If you have questions, you can email [email or web address] or call [phone] to talk to our MyChart staff. Remember, MyChart is NOT to be used for urgent needs. For medical emergencies, dial 911.  First Trimester of Pregnancy The first trimester of pregnancy is from week 1 until the end of week 12 (months 1 through 3). A week after a sperm fertilizes an egg, the egg will implant on the wall  of the uterus. This embryo will begin to develop into a baby. Genes from you and your partner are forming the baby. The female genes determine whether the baby is a boy or a girl. At 6-8 weeks, the eyes and face are formed, and the heartbeat can be seen on ultrasound. At the end of 12 weeks, all the baby's organs are formed.  Now that you are pregnant, you will want to do everything you can to have a healthy baby. Two of the most important things are to get good prenatal care and to follow your health care provider's instructions. Prenatal care is all the medical care you receive before the baby's birth. This care will help prevent, find, and treat any problems during the pregnancy and childbirth. BODY CHANGES Your body goes through many changes during pregnancy. The changes vary from woman to woman.   You may gain or lose a couple of pounds at first.  You may feel sick to your stomach (nauseous) and throw up (vomit). If the vomiting is uncontrollable, call your health care provider.  You may tire easily.  You may develop headaches that can be relieved by medicines approved by your health care provider.  You may urinate more often. Painful urination may mean you have a bladder infection.  You may develop heartburn as a result of your pregnancy.  You may develop constipation because certain hormones are causing the muscles that push waste through your intestines to slow down.  You may develop hemorrhoids or  swollen, bulging veins (varicose veins).  Your breasts may begin to grow larger and become tender. Your nipples may stick out more, and the tissue that surrounds them (areola) may become darker.  Your gums may bleed and may be sensitive to brushing and flossing.  Dark spots or blotches (chloasma, mask of pregnancy) may develop on your face. This will likely fade after the baby is born.  Your menstrual periods will stop.  You may have a loss of appetite.  You may develop cravings for  certain kinds of food.  You may have changes in your emotions from day to day, such as being excited to be pregnant or being concerned that something may go wrong with the pregnancy and baby.  You may have more vivid and strange dreams.  You may have changes in your hair. These can include thickening of your hair, rapid growth, and changes in texture. Some women also have hair loss during or after pregnancy, or hair that feels dry or thin. Your hair will most likely return to normal after your baby is born. WHAT TO EXPECT AT YOUR PRENATAL VISITS During a routine prenatal visit:  You will be weighed to make sure you and the baby are growing normally.  Your blood pressure will be taken.  Your abdomen will be measured to track your baby's growth.  The fetal heartbeat will be listened to starting around week 10 or 12 of your pregnancy.  Test results from any previous visits will be discussed. Your health care provider may ask you:  How you are feeling.  If you are feeling the baby move.  If you have had any abnormal symptoms, such as leaking fluid, bleeding, severe headaches, or abdominal cramping.  If you are using any tobacco products, including cigarettes, chewing tobacco, and electronic cigarettes.  If you have any questions. Other tests that may be performed during your first trimester include:  Blood tests to find your blood type and to check for the presence of any previous infections. They will also be used to check for low iron levels (anemia) and Rh antibodies. Later in the pregnancy, blood tests for diabetes will be done along with other tests if problems develop.  Urine tests to check for infections, diabetes, or protein in the urine.  An ultrasound to confirm the proper growth and development of the baby.  An amniocentesis to check for possible genetic problems.  Fetal screens for spina bifida and Down syndrome.  You may need other tests to make sure you and the  baby are doing well.  HIV (human immunodeficiency virus) testing. Routine prenatal testing includes screening for HIV, unless you choose not to have this test. HOME CARE INSTRUCTIONS  Medicines  Follow your health care provider's instructions regarding medicine use. Specific medicines may be either safe or unsafe to take during pregnancy.  Take your prenatal vitamins as directed.  If you develop constipation, try taking a stool softener if your health care provider approves. Diet  Eat regular, well-balanced meals. Choose a variety of foods, such as meat or vegetable-based protein, fish, milk and low-fat dairy products, vegetables, fruits, and whole grain breads and cereals. Your health care provider will help you determine the amount of weight gain that is right for you.  Avoid raw meat and uncooked cheese. These carry germs that can cause birth defects in the baby.  Eating four or five small meals rather than three large meals a day may help relieve nausea and vomiting. If you start  to feel nauseous, eating a few soda crackers can be helpful. Drinking liquids between meals instead of during meals also seems to help nausea and vomiting.  If you develop constipation, eat more high-fiber foods, such as fresh vegetables or fruit and whole grains. Drink enough fluids to keep your urine clear or pale yellow. Activity and Exercise  Exercise only as directed by your health care provider. Exercising will help you:  Control your weight.  Stay in shape.  Be prepared for labor and delivery.  Experiencing pain or cramping in the lower abdomen or low back is a good sign that you should stop exercising. Check with your health care provider before continuing normal exercises.  Try to avoid standing for long periods of time. Move your legs often if you must stand in one place for a long time.  Avoid heavy lifting.  Wear low-heeled shoes, and practice good posture.  You may continue to have sex  unless your health care provider directs you otherwise. Relief of Pain or Discomfort  Wear a good support bra for breast tenderness.   Take warm sitz baths to soothe any pain or discomfort caused by hemorrhoids. Use hemorrhoid cream if your health care provider approves.   Rest with your legs elevated if you have leg cramps or low back pain.  If you develop varicose veins in your legs, wear support hose. Elevate your feet for 15 minutes, 3-4 times a day. Limit salt in your diet. Prenatal Care  Schedule your prenatal visits by the twelfth week of pregnancy. They are usually scheduled monthly at first, then more often in the last 2 months before delivery.  Write down your questions. Take them to your prenatal visits.  Keep all your prenatal visits as directed by your health care provider. Safety  Wear your seat belt at all times when driving.  Make a list of emergency phone numbers, including numbers for family, friends, the hospital, and police and fire departments. General Tips  Ask your health care provider for a referral to a local prenatal education class. Begin classes no later than at the beginning of month 6 of your pregnancy.  Ask for help if you have counseling or nutritional needs during pregnancy. Your health care provider can offer advice or refer you to specialists for help with various needs.  Do not use hot tubs, steam rooms, or saunas.  Do not douche or use tampons or scented sanitary pads.  Do not cross your legs for long periods of time.  Avoid cat litter boxes and soil used by cats. These carry germs that can cause birth defects in the baby and possibly loss of the fetus by miscarriage or stillbirth.  Avoid all smoking, herbs, alcohol, and medicines not prescribed by your health care provider. Chemicals in these affect the formation and growth of the baby.  Do not use any tobacco products, including cigarettes, chewing tobacco, and electronic cigarettes. If  you need help quitting, ask your health care provider. You may receive counseling support and other resources to help you quit.  Schedule a dentist appointment. At home, brush your teeth with a soft toothbrush and be gentle when you floss. SEEK MEDICAL CARE IF:   You have dizziness.  You have mild pelvic cramps, pelvic pressure, or nagging pain in the abdominal area.  You have persistent nausea, vomiting, or diarrhea.  You have a bad smelling vaginal discharge.  You have pain with urination.  You notice increased swelling in your face, hands, legs,  or ankles. SEEK IMMEDIATE MEDICAL CARE IF:   You have a fever.  You are leaking fluid from your vagina.  You have spotting or bleeding from your vagina.  You have severe abdominal cramping or pain.  You have rapid weight gain or loss.  You vomit blood or material that looks like coffee grounds.  You are exposed to [place] measles and have never had them.  You are exposed to fifth disease or chickenpox.  You develop a severe headache.  You have shortness of breath.  You have any kind of trauma, such as from a fall or a car accident.   This information is not intended to replace advice given to you by your health care provider. Make sure you discuss any questions you have with your health care provider.   Document Released: 01/11/2001 Document Revised: 02/07/2014 Document Reviewed: 11/27/2012 Elsevier Interactive Patient Education 2016 [employer].  Second Trimester of Pregnancy The second trimester is from week 13 through week 28, months 4 through 6. The second trimester is often a time when you feel your best. Your body has also adjusted to being pregnant, and you begin to feel better physically. Usually, morning sickness has lessened or quit completely, you may have more energy, and you may have an increase in appetite. The second trimester is also a time when the fetus is growing rapidly. At the end of the sixth month, the  fetus is about 9 inches long and weighs about 1 pounds. You will likely begin to feel the baby move (quickening) between 18 and 20 weeks of the pregnancy. BODY CHANGES Your body goes through many changes during pregnancy. The changes vary from woman to woman.   Your weight will continue to increase. You will notice your lower abdomen bulging out.  You may begin to get stretch marks on your hips, abdomen, and breasts.  You may develop headaches that can be relieved by medicines approved by your health care provider.  You may urinate more often because the fetus is pressing on your bladder.  You may develop or continue to have heartburn as a result of your pregnancy.  You may develop constipation because certain hormones are causing the muscles that push waste through your intestines to slow down.  You may develop hemorrhoids or swollen, bulging veins (varicose veins).  You may have back pain because of the weight gain and pregnancy hormones relaxing your joints between the bones in your pelvis and as a result of a shift in weight and the muscles that support your balance.  Your breasts will continue to grow and be tender.  Your gums may bleed and may be sensitive to brushing and flossing.  Dark spots or blotches (chloasma, mask of pregnancy) may develop on your face. This will likely fade after the baby is born.  A dark line from your belly button to the pubic area (linea nigra) may appear. This will likely fade after the baby is born.  You may have changes in your hair. These can include thickening of your hair, rapid growth, and changes in texture. Some women also have hair loss during or after pregnancy, or hair that feels dry or thin. Your hair will most likely return to normal after your baby is born. WHAT TO EXPECT AT YOUR PRENATAL VISITS During a routine prenatal visit:  You will be weighed to make sure you and the fetus are growing normally.  Your blood pressure will be  taken.  Your abdomen will be measured  to track your baby's growth.  The fetal heartbeat will be listened to.  Any test results from the previous visit will be discussed. Your health care provider may ask you:  How you are feeling.  If you are feeling the baby move.  If you have had any abnormal symptoms, such as leaking fluid, bleeding, severe headaches, or abdominal cramping.  If you are using any tobacco products, including cigarettes, chewing tobacco, and electronic cigarettes.  If you have any questions. Other tests that may be performed during your second trimester include:  Blood tests that check for:  Low iron levels (anemia).  Gestational diabetes (between 24 and 28 weeks).  Rh antibodies.  Urine tests to check for infections, diabetes, or protein in the urine.  An ultrasound to confirm the proper growth and development of the baby.  An amniocentesis to check for possible genetic problems.  Fetal screens for spina bifida and Down syndrome.  HIV (human immunodeficiency virus) testing. Routine prenatal testing includes screening for HIV, unless you choose not to have this test. HOME CARE INSTRUCTIONS   Avoid all smoking, herbs, alcohol, and unprescribed drugs. These chemicals affect the formation and growth of the baby.  Do not use any tobacco products, including cigarettes, chewing tobacco, and electronic cigarettes. If you need help quitting, ask your health care provider. You may receive counseling support and other resources to help you quit.  Follow your health care provider's instructions regarding medicine use. There are medicines that are either safe or unsafe to take during pregnancy.  Exercise only as directed by your health care provider. Experiencing uterine cramps is a good sign to stop exercising.  Continue to eat regular, healthy meals.  Wear a good support bra for breast tenderness.  Do not use hot tubs, steam rooms, or saunas.  Wear your  seat belt at all times when driving.  Avoid raw meat, uncooked cheese, cat litter boxes, and soil used by cats. These carry germs that can cause birth defects in the baby.  Take your prenatal vitamins.  Take 1500-2000 mg of calcium daily starting at the 20th week of pregnancy until you deliver your baby.  Try taking a stool softener (if your health care provider approves) if you develop constipation. Eat more high-fiber foods, such as fresh vegetables or fruit and whole grains. Drink plenty of fluids to keep your urine clear or pale yellow.  Take warm sitz baths to soothe any pain or discomfort caused by hemorrhoids. Use hemorrhoid cream if your health care provider approves.  If you develop varicose veins, wear support hose. Elevate your feet for 15 minutes, 3-4 times a day. Limit salt in your diet.  Avoid heavy lifting, wear low heel shoes, and practice good posture.  Rest with your legs elevated if you have leg cramps or low back pain.  Visit your dentist if you have not gone yet during your pregnancy. Use a soft toothbrush to brush your teeth and be gentle when you floss.  A sexual relationship may be continued unless your health care provider directs you otherwise.  Continue to go to all your prenatal visits as directed by your health care provider. SEEK MEDICAL CARE IF:   You have dizziness.  You have mild pelvic cramps, pelvic pressure, or nagging pain in the abdominal area.  You have persistent nausea, vomiting, or diarrhea.  You have a bad smelling vaginal discharge.  You have pain with urination. SEEK IMMEDIATE MEDICAL CARE IF:   You have a fever.  You are leaking fluid from your vagina.  You have spotting or bleeding from your vagina.  You have severe abdominal cramping or pain.  You have rapid weight gain or loss.  You have shortness of breath with chest pain.  You notice sudden or extreme swelling of your face, hands, ankles, feet, or legs.  You have not  felt your baby move in over an hour.  You have severe headaches that do not go away with medicine.  You have vision changes.   This information is not intended to replace advice given to you by your health care provider. Make sure you discuss any questions you have with your health care provider.   Document Released: 01/11/2001 Document Revised: 02/07/2014 Document Reviewed: 03/20/2012 Elsevier Interactive Patient Education [employer]  Breastfeeding Deciding to breastfeed is one of the best choices you can make for you and your baby. A change in hormones during pregnancy causes your breast tissue to grow and increases the number and size of your milk ducts. These hormones also allow proteins, sugars, and fats from your blood supply to make breast milk in your milk-producing glands. Hormones prevent breast milk from being released before your baby is born as well as prompt milk flow after birth. Once breastfeeding has begun, thoughts of your baby, as well as his or her sucking or crying, can stimulate the release of milk from your milk-producing glands.  BENEFITS OF BREASTFEEDING For Your Baby  Your first milk (colostrum) helps your baby's digestive system function better.  There are antibodies in your milk that help your baby fight off infections.  Your baby has a lower incidence of asthma, allergies, and sudden infant death syndrome.  The nutrients in breast milk are better for your baby than infant formulas and are designed uniquely for your baby's needs.  Breast milk improves your baby's brain development.  Your baby is less likely to develop other conditions, such as childhood obesity, asthma, or type 2 diabetes mellitus. For You  Breastfeeding helps to create a very special bond between you and your baby.  Breastfeeding is convenient. Breast milk is always available at the correct temperature and costs nothing.  Breastfeeding helps to burn calories and helps you lose  the weight gained during pregnancy.  Breastfeeding makes your uterus contract to its prepregnancy size faster and slows bleeding (lochia) after you give birth.   Breastfeeding helps to lower your risk of developing type 2 diabetes mellitus, osteoporosis, and breast or ovarian cancer later in life. SIGNS THAT YOUR BABY IS HUNGRY Early Signs of Hunger  Increased alertness or activity.  Stretching.  Movement of the head from side to side.  Movement of the head and opening of the mouth when the corner of the mouth or cheek is stroked (rooting).  Increased sucking sounds, smacking lips, cooing, sighing, or squeaking.  Hand-to-mouth movements.  Increased sucking of fingers or hands. Late Signs of Hunger  Fussing.  Intermittent crying. Extreme Signs of Hunger Signs of extreme hunger will require calming and consoling before your baby will be able to breastfeed successfully. Do not wait for the following signs of extreme hunger to occur before you initiate breastfeeding:  Restlessness.  A loud, strong cry.  Screaming. BREASTFEEDING BASICS Breastfeeding Initiation  Find a comfortable place to sit or lie down, with your neck and back well supported.  Place a pillow or rolled up blanket under your baby to bring him or her to the level of your breast (if you are  seated). Nursing pillows are specially designed to help support your arms and your baby while you breastfeed.  Make sure that your baby's abdomen is facing your abdomen.  Gently massage your breast. With your fingertips, massage from your chest wall toward your nipple in a circular motion. This encourages milk flow. You may need to continue this action during the feeding if your milk flows slowly.  Support your breast with 4 fingers underneath and your thumb above your nipple. Make sure your fingers are well away from your nipple and your baby's mouth.  Stroke your baby's lips gently with your finger or nipple.  When  your baby's mouth is open wide enough, quickly bring your baby to your breast, placing your entire nipple and as much of the colored area around your nipple (areola) as possible into your baby's mouth.  More areola should be visible above your baby's upper lip than below the lower lip.  Your baby's tongue should be between his or her lower gum and your breast.  Ensure that your baby's mouth is correctly positioned around your nipple (latched). Your baby's lips should create a seal on your breast and be turned out (everted).  It is common for your baby to suck about 2-3 minutes in order to start the flow of breast milk. Latching Teaching your baby how to latch on to your breast properly is very important. An improper latch can cause nipple pain and decreased milk supply for you and poor weight gain in your baby. Also, if your baby is not latched onto your nipple properly, he or she may swallow some air during feeding. This can make your baby fussy. Burping your baby when you switch breasts during the feeding can help to get rid of the air. However, teaching your baby to latch on properly is still the best way to prevent fussiness from swallowing air while breastfeeding. Signs that your baby has successfully latched on to your nipple:  Silent tugging or silent sucking, without causing you pain.  Swallowing heard between every 3-4 sucks.  Muscle movement above and in front of his or her ears while sucking. Signs that your baby has not successfully latched on to nipple:  Sucking sounds or smacking sounds from your baby while breastfeeding.  Nipple pain. If you think your baby has not latched on correctly, slip your finger into the corner of your baby's mouth to break the suction and place it between your baby's gums. Attempt breastfeeding initiation again. Signs of Successful Breastfeeding Signs from your baby:  A gradual decrease in the number of sucks or complete cessation of  sucking.  Falling asleep.  Relaxation of his or her body.  Retention of a small amount of milk in his or her mouth.  Letting go of your breast by himself or herself. Signs from you:  Breasts that have increased in firmness, weight, and size 1-3 hours after feeding.  Breasts that are softer immediately after breastfeeding.  Increased milk volume, as well as a change in milk consistency and color by the fifth day of breastfeeding.  Nipples that are not sore, cracked, or bleeding. Signs That Your Pecola Leisure is Getting Enough Milk  Wetting at least 3 diapers in a 24-hour period. The urine should be clear and pale yellow by age 1 days.  At least 3 stools in a 24-hour period by age 1 days. The stool should be soft and yellow.  At least 3 stools in a 24-hour period by age 24 days. The  stool should be seedy and yellow.  No loss of weight greater than 10% of birth weight during the first 70 days of age.  Average weight gain of 4-7 ounces (113-198 g) per week after age 10 days.  Consistent daily weight gain by age 93 days, without weight loss after the age of 2 weeks. After a feeding, your baby may spit up a small amount. This is common. BREASTFEEDING FREQUENCY AND DURATION Frequent feeding will help you make more milk and can prevent sore nipples and breast engorgement. Breastfeed when you feel the need to reduce the fullness of your breasts or when your baby shows signs of hunger. This is called "breastfeeding on demand." Avoid introducing a pacifier to your baby while you are working to establish breastfeeding (the first 4-6 weeks after your baby is born). After this time you may choose to use a pacifier. Research has shown that pacifier use during the first year of a baby's life decreases the risk of sudden infant death syndrome (SIDS). Allow your baby to feed on each breast as long as he or she wants. Breastfeed until your baby is finished feeding. When your baby unlatches or falls asleep while  feeding from the first breast, offer the second breast. Because newborns are often sleepy in the first few weeks of life, you may need to awaken your baby to get him or her to feed. Breastfeeding times will vary from baby to baby. However, the following rules can serve as a guide to help you ensure that your baby is properly fed:  Newborns (babies 63 weeks of age or younger) may breastfeed every 1-3 hours.  Newborns should not go longer than 3 hours during the day or 5 hours during the night without breastfeeding.  You should breastfeed your baby a minimum of 8 times in a 24-hour period until you begin to introduce solid foods to your baby at around 11 months of age. BREAST MILK PUMPING Pumping and storing breast milk allows you to ensure that your baby is exclusively fed your breast milk, even at times when you are unable to breastfeed. This is especially important if you are going back to work while you are still breastfeeding or when you are not able to be present during feedings. Your lactation consultant can give you guidelines on how long it is safe to store breast milk. A breast pump is a machine that allows you to pump milk from your breast into a sterile bottle. The pumped breast milk can then be stored in a refrigerator or freezer. Some breast pumps are operated by hand, while others use electricity. Ask your lactation consultant which type will work best for you. Breast pumps can be purchased, but some hospitals and breastfeeding support groups lease breast pumps on a monthly basis. A lactation consultant can teach you how to hand express breast milk, if you prefer not to use a pump. CARING FOR YOUR BREASTS WHILE YOU BREASTFEED Nipples can become dry, cracked, and sore while breastfeeding. The following recommendations can help keep your breasts moisturized and healthy:  Avoid using soap on your nipples.  Wear a supportive bra. Although not required, special nursing bras and tank tops are  designed to allow access to your breasts for breastfeeding without taking off your entire bra or top. Avoid wearing underwire-style bras or extremely tight bras.  Air dry your nipples for 3-61minutes after each feeding.  Use only cotton bra pads to absorb leaked breast milk. Leaking of breast milk between  feedings is normal.  Use lanolin on your nipples after breastfeeding. Lanolin helps to maintain your skin's normal moisture barrier. If you use pure lanolin, you do not need to wash it off before feeding your baby again. Pure lanolin is not toxic to your baby. You may also hand express a few drops of breast milk and gently massage that milk into your nipples and allow the milk to air dry. In the first few weeks after giving birth, some women experience extremely full breasts (engorgement). Engorgement can make your breasts feel heavy, warm, and tender to the touch. Engorgement peaks within 3-5 days after you give birth. The following recommendations can help ease engorgement:  Completely empty your breasts while breastfeeding or pumping. You may want to start by applying warm, moist heat (in the shower or with warm water-soaked hand towels) just before feeding or pumping. This increases circulation and helps the milk flow. If your baby does not completely empty your breasts while breastfeeding, pump any extra milk after he or she is finished.  Wear a snug bra (nursing or regular) or tank top for 1-2 days to signal your body to slightly decrease milk production.  Apply ice packs to your breasts, unless this is too uncomfortable for you.  Make sure that your baby is latched on and positioned properly while breastfeeding. If engorgement persists after 48 hours of following these recommendations, contact your health care provider or a Advertising copywriter. OVERALL HEALTH CARE RECOMMENDATIONS WHILE BREASTFEEDING  Eat healthy foods. Alternate between meals and snacks, eating 3 of each per day. Because  what you eat affects your breast milk, some of the foods may make your baby more irritable than usual. Avoid eating these foods if you are sure that they are negatively affecting your baby.  Drink milk, fruit juice, and water to satisfy your thirst (about 10 glasses a day).  Rest often, relax, and continue to take your prenatal vitamins to prevent fatigue, stress, and anemia.  Continue breast self-awareness checks.  Avoid chewing and smoking tobacco. Chemicals from cigarettes that pass into breast milk and exposure to secondhand smoke may harm your baby.  Avoid alcohol and drug use, including marijuana. Some medicines that may be harmful to your baby can pass through breast milk. It is important to ask your health care provider before taking any medicine, including all over-the-counter and prescription medicine as well as vitamin and herbal supplements. It is possible to become pregnant while breastfeeding. If birth control is desired, ask your health care provider about options that will be safe for your baby. SEEK MEDICAL CARE IF:  You feel like you want to stop breastfeeding or have become frustrated with breastfeeding.  You have painful breasts or nipples.  Your nipples are cracked or bleeding.  Your breasts are red, tender, or warm.  You have a swollen area on either breast.  You have a fever or chills.  You have nausea or vomiting.  You have drainage other than breast milk from your nipples.  Your breasts do not become full before feedings by the fifth day after you give birth.  You feel sad and depressed.  Your baby is too sleepy to eat well.  Your baby is having trouble sleeping.   Your baby is wetting less than 3 diapers in a 24-hour period.  Your baby has less than 3 stools in a 24-hour period.  Your baby's skin or the white part of his or her eyes becomes yellow.   Your baby is  not gaining weight by 109 days of age. SEEK IMMEDIATE MEDICAL CARE IF:  Your baby  is overly tired (lethargic) and does not want to wake up and feed.  Your baby develops an unexplained fever.   This information is not intended to replace advice given to you by your health care provider. Make sure you discuss any questions you have with your health care provider.   Document Released: 01/17/2005 Document Revised: 10/08/2014 Document Reviewed: 07/11/2012 Elsevier Interactive Patient Education [employer]

## 2015-08-17 NOTE — Progress Notes (Signed)
Subjective:    [name] is a 35 y.o. [id number] at 6079w5d GA, by LMP consistent with 6 week scan, being seen today for her first obstetrical visit.  Her obstetrical history is significant for obesity and history of cesarean section during last pregnancy for umbilical cord prolapse.  Patient does intend to breast feed. Pregnancy history fully reviewed.  Patient reports occasional headaches not alleviated headaches.  Filed Vitals:   08/17/15 1517  BP: 120/71  Pulse: 85  Weight: 252 lb (114.306 kg)    HISTORY: OB History  Gravida Para Term Preterm AB SAB TAB Ectopic Multiple Living  7 3 3  0 3 2 1  0 0 2    # Outcome Date GA Lbr Len/2nd Weight Sex Delivery Anes PTL Lv  7 Current           6 Term 02/24/11 3228w4d  7 lb 9 oz (3.43 kg) M CS-LTranv EPI  Y     Comments: laceration above left eyebrow  5 SAB 2012 216w0d            Comments: IUFD  4 SAB 2007 2137w0d         3 TAB 2005 6693w0d            Comments: multiple anomalies  2 Term 2005    F Vag-Spont   N     Comments: SIDS 2 mo.  1 Term 2002 967w0d   M Vag-Spont   Y     Past Medical History  Diagnosis Date  . 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 Heart murmur   . Morbid obesity (HCC) 03/21/2013  . Depression with anxiety 07/04/2013   Past Surgical History  Procedure Laterality Date  . Breast surgery  2005    L breast infection after loss  . Cesarean section  02/24/2011    Procedure: CESAREAN SECTION;  Surgeon: Brock Badharles A Harper, MD;  Location: [facility];  Service: Gynecology;  Laterality: N/A;  Primary, cord ph 7.29   Family History  Problem Relation Age of Onset  . Diabetes Maternal Aunt   . Breast cancer Maternal Aunt     2 maternal aunts with breast cancer diagnosed at 8530 and 33 yo  . Cancer Maternal Grandfather     lung  . Colon cancer Maternal Grandfather     died in his 9960's.      Exam    Uterus:     Pelvic Exam:    Perineum: No Hemorrhoids, Normal Perineum   Vulva: normal   Vagina:  normal mucosa, normal discharge   Cervix: multiparous appearance, no bleeding following Pap, no cervical motion tenderness and no lesions   Adnexa: normal adnexa and no mass, fullness, tenderness   Bony Pelvis: gynecoid  System: Breast:  normal appearance, no masses or tenderness   Skin: normal coloration and turgor, no rashes   Neurologic: oriented, normal, negative   Extremities: normal strength, tone, and muscle mass   HEENT PERRLA, extra ocular movement intact and sclera clear, anicteric   Mouth/Teeth mucous membranes moist, pharynx normal without lesions and dental hygiene good   Neck supple and no masses   Cardiovascular: regular rate and rhythm   Respiratory:  appears well, vitals normal, no respiratory distress, acyanotic, normal RR, chest clear, no wheezing, crepitations, rhonchi, normal symmetric air entry   Abdomen: soft, non-tender; bowel sounds normal; no masses,  no organomegaly   Urinary: urethral meatus normal   Bedside scan  showed +FH in 160s   Assessment:    Pregnancy: [id number] Patient Active Problem List   Diagnosis Date Noted  . Previous cesarean delivery, antepartum 08/17/2015  . Supervision of normal pregnancy 08/17/2015  . Obesity in pregnancy, antepartum 08/17/2015  . Headache in pregnancy, antepartum 08/17/2015  . Depression with anxiety 07/04/2013  . Morbid obesity (HCC) 03/21/2013    Plan:   Initial labs drawn. Will have early 2 hr GTT and likely AFP labs at next visit. Nutrition counseling also recommended; patient declined this recommendation.  Continue Prenatal vitamins. Fioricet ordered as needed for headaches. Genetic Screening discussed Integrated Screen: ordered. Ultrasound discussed; fetal survey: to be ordered later. The nature of [name] with multiple MDs and other Advanced Practice Providers was explained to patient; also emphasized that residents, students are part of our team. Problem list  reviewed and updated. Follow up in 4 weeks.  Routine obstetric precautions reviewed.    Tereso Newcomer, MD 08/17/2015

## 2015-08-18 ENCOUNTER — Encounter: Payer: Self-pay | Admitting: Obstetrics & Gynecology

## 2015-08-18 DIAGNOSIS — O09899 Supervision of other high risk pregnancies, unspecified trimester: Secondary | ICD-10-CM | POA: Insufficient documentation

## 2015-08-18 DIAGNOSIS — O9989 Other specified diseases and conditions complicating pregnancy, childbirth and the puerperium: Secondary | ICD-10-CM

## 2015-08-18 DIAGNOSIS — Z283 Underimmunization status: Secondary | ICD-10-CM | POA: Insufficient documentation

## 2015-08-19 ENCOUNTER — Encounter (HOSPITAL_COMMUNITY): Payer: Self-pay | Admitting: Obstetrics & Gynecology

## 2015-08-19 LAB — GC/CHLAMYDIA PROBE AMP
Chlamydia trachomatis, NAA: NEGATIVE
NEISSERIA GONORRHOEAE BY PCR: NEGATIVE

## 2015-08-20 ENCOUNTER — Encounter: Payer: Medicaid Other | Admitting: Obstetrics and Gynecology

## 2015-08-20 LAB — IGP, APTIMA HPV, RFX 16/18,45
HPV Aptima: NEGATIVE
PAP Smear Comment: 0

## 2015-08-23 LAB — CULTURE, OB URINE

## 2015-08-23 LAB — URINE CULTURE, OB REFLEX

## 2015-08-25 ENCOUNTER — Encounter (HOSPITAL_COMMUNITY): Payer: Self-pay

## 2015-08-25 LAB — PRENATAL PROFILE I(LABCORP)
Antibody Screen: NEGATIVE
BASOS ABS: 0 10*3/uL (ref 0.0–0.2)
Basos: 0 %
EOS (ABSOLUTE): 0.1 10*3/uL (ref 0.0–0.4)
EOS: 2 %
HEP B S AG: NEGATIVE
Hematocrit: 32.4 % — ABNORMAL LOW (ref 34.0–46.6)
Hemoglobin: 10.6 g/dL — ABNORMAL LOW (ref 11.1–15.9)
IMMATURE GRANS (ABS): 0 10*3/uL (ref 0.0–0.1)
IMMATURE GRANULOCYTES: 0 %
LYMPHS: 43 %
Lymphocytes Absolute: 3.1 10*3/uL (ref 0.7–3.1)
MCH: 25.1 pg — ABNORMAL LOW (ref 26.6–33.0)
MCHC: 32.7 g/dL (ref 31.5–35.7)
MCV: 77 fL — ABNORMAL LOW (ref 79–97)
MONOCYTES: 4 %
Monocytes Absolute: 0.3 10*3/uL (ref 0.1–0.9)
NEUTROS ABS: 3.7 10*3/uL (ref 1.4–7.0)
NEUTROS PCT: 51 %
PLATELETS: 278 10*3/uL (ref 150–379)
RBC: 4.23 x10E6/uL (ref 3.77–5.28)
RDW: 15.1 % (ref 12.3–15.4)
RPR Ser Ql: NONREACTIVE
Rh Factor: POSITIVE
Rubella Antibodies, IGG: <0.9 index — ABNORMAL LOW (ref >0.99)
WBC: 7.3 10*3/uL (ref 3.4–10.8)

## 2015-08-25 LAB — HEMOGLOBINOPATHY EVALUATION
HEMOGLOBIN F QUANTITATION: 0 % (ref 0.0–2.0)
HGB A: 97.7 % (ref 94.0–98.0)
HGB C: 0 %
HGB S: 0 %
Hemoglobin A2 Quantitation: 2.3 % (ref 0.7–3.1)

## 2015-08-25 LAB — TOXASSURE SELECT 13 (MW), URINE: PDF: 0

## 2015-08-25 LAB — HIV ANTIBODY (ROUTINE TESTING W REFLEX): HIV SCREEN 4TH GENERATION: NONREACTIVE

## 2015-08-26 ENCOUNTER — Other Ambulatory Visit: Payer: Self-pay | Admitting: Obstetrics & Gynecology

## 2015-08-26 ENCOUNTER — Ambulatory Visit (HOSPITAL_COMMUNITY)
Admission: RE | Admit: 2015-08-26 | Discharge: 2015-08-26 | Disposition: A | Discharge status: Home / Self Care | Payer: Medicaid Other | Source: Ambulatory Visit | Attending: Obstetrics & Gynecology | Admitting: Obstetrics & Gynecology

## 2015-08-26 ENCOUNTER — Encounter (HOSPITAL_COMMUNITY): Payer: Self-pay

## 2015-08-26 DIAGNOSIS — O99211 Obesity complicating pregnancy, first trimester: Secondary | ICD-10-CM | POA: Diagnosis present

## 2015-08-26 DIAGNOSIS — Z3A13 13 weeks gestation of pregnancy: Secondary | ICD-10-CM | POA: Diagnosis not present

## 2015-08-26 DIAGNOSIS — Z36 Encounter for antenatal screening of mother: Secondary | ICD-10-CM | POA: Diagnosis not present

## 2015-08-26 DIAGNOSIS — R51 Headache: Secondary | ICD-10-CM

## 2015-08-26 DIAGNOSIS — Z283 Underimmunization status: Secondary | ICD-10-CM

## 2015-08-26 DIAGNOSIS — Z2839 Other underimmunization status: Secondary | ICD-10-CM

## 2015-08-26 DIAGNOSIS — O34219 Maternal care for unspecified type scar from previous cesarean delivery: Secondary | ICD-10-CM

## 2015-08-26 DIAGNOSIS — O9989 Other specified diseases and conditions complicating pregnancy, childbirth and the puerperium: Secondary | ICD-10-CM

## 2015-08-26 DIAGNOSIS — Z3491 Encounter for supervision of normal pregnancy, unspecified, first trimester: Secondary | ICD-10-CM

## 2015-08-26 DIAGNOSIS — Z369 Encounter for antenatal screening, unspecified: Secondary | ICD-10-CM

## 2015-08-26 DIAGNOSIS — O26891 Other specified pregnancy related conditions, first trimester: Secondary | ICD-10-CM

## 2015-09-03 ENCOUNTER — Other Ambulatory Visit (HOSPITAL_COMMUNITY): Payer: Self-pay

## 2015-09-08 ENCOUNTER — Other Ambulatory Visit: Payer: Medicaid Other

## 2015-09-08 ENCOUNTER — Ambulatory Visit (INDEPENDENT_AMBULATORY_CARE_PROVIDER_SITE_OTHER): Payer: Medicaid Other | Admitting: Obstetrics & Gynecology

## 2015-09-08 VITALS — BP 121/70 | HR 80 | Wt 266.0 lb

## 2015-09-08 DIAGNOSIS — O99212 Obesity complicating pregnancy, second trimester: Secondary | ICD-10-CM

## 2015-09-08 DIAGNOSIS — Z3492 Encounter for supervision of normal pregnancy, unspecified, second trimester: Secondary | ICD-10-CM

## 2015-09-08 DIAGNOSIS — O26891 Other specified pregnancy related conditions, first trimester: Secondary | ICD-10-CM

## 2015-09-08 DIAGNOSIS — R51 Headache: Secondary | ICD-10-CM

## 2015-09-08 DIAGNOSIS — O26892 Other specified pregnancy related conditions, second trimester: Secondary | ICD-10-CM | POA: Diagnosis not present

## 2015-09-08 DIAGNOSIS — R519 Headache, unspecified: Secondary | ICD-10-CM

## 2015-09-08 MED ORDER — SUMATRIPTAN SUCCINATE 100 MG PO TABS: 100.0000 mg | ORAL_TABLET | Freq: Once | ORAL | 11 refills | Status: DC | PRN

## 2015-09-08 MED ORDER — BUTALBITAL-APAP-CAFFEINE 50-325-40 MG PO CAPS: 1.0000 | ORAL_CAPSULE | Freq: Four times a day (QID) | ORAL | 3 refills | Status: DC | PRN

## 2015-09-08 NOTE — Patient Instructions (Signed)
Return to clinic for any scheduled appointments or obstetric concerns, or go to [facility] for evaluation  Second Trimester of Pregnancy The second trimester is from week 13 through week 28, months 4 through 6. The second trimester is often a time when you feel your best. Your body has also adjusted to being pregnant, and you begin to feel better physically. Usually, morning sickness has lessened or quit completely, you may have more energy, and you may have an increase in appetite. The second trimester is also a time when the fetus is growing rapidly. At the end of the sixth month, the fetus is about 9 inches long and weighs about 1 pounds. You will likely begin to feel the baby move (quickening) between 18 and 20 weeks of the pregnancy. BODY CHANGES Your body goes through many changes during pregnancy. The changes vary from woman to woman.   Your weight will continue to increase. You will notice your lower abdomen bulging out.  You may begin to get stretch marks on your hips, abdomen, and breasts.  You may develop headaches that can be relieved by medicines approved by your health care provider.  You may urinate more often because the fetus is pressing on your bladder.  You may develop or continue to have heartburn as a result of your pregnancy.  You may develop constipation because certain hormones are causing the muscles that push waste through your intestines to slow down.  You may develop hemorrhoids or swollen, bulging veins (varicose veins).  You may have back pain because of the weight gain and pregnancy hormones relaxing your joints between the bones in your pelvis and as a result of a shift in weight and the muscles that support your balance.  Your breasts will continue to grow and be tender.  Your gums may bleed and may be sensitive to brushing and flossing.  Dark spots or blotches (chloasma, mask of pregnancy) may develop on your face. This will likely fade after the baby is  born.  A dark line from your belly button to the pubic area (linea nigra) may appear. This will likely fade after the baby is born.  You may have changes in your hair. These can include thickening of your hair, rapid growth, and changes in texture. Some women also have hair loss during or after pregnancy, or hair that feels dry or thin. Your hair will most likely return to normal after your baby is born. WHAT TO EXPECT AT YOUR PRENATAL VISITS During a routine prenatal visit:  You will be weighed to make sure you and the fetus are growing normally.  Your blood pressure will be taken.  Your abdomen will be measured to track your baby's growth.  The fetal heartbeat will be listened to.  Any test results from the previous visit will be discussed. Your health care provider may ask you:  How you are feeling.  If you are feeling the baby move.  If you have had any abnormal symptoms, such as leaking fluid, bleeding, severe headaches, or abdominal cramping.  If you are using any tobacco products, including cigarettes, chewing tobacco, and electronic cigarettes.  If you have any questions. Other tests that may be performed during your second trimester include:  Blood tests that check for:  Low iron levels (anemia).  Gestational diabetes (between 24 and 28 weeks).  Rh antibodies.  Urine tests to check for infections, diabetes, or protein in the urine.  An ultrasound to confirm the proper growth and development of   the baby.  An amniocentesis to check for possible genetic problems.  Fetal screens for spina bifida and Down syndrome.  HIV (human immunodeficiency virus) testing. Routine prenatal testing includes screening for HIV, unless you choose not to have this test. HOME CARE INSTRUCTIONS   Avoid all smoking, herbs, alcohol, and unprescribed drugs. These chemicals affect the formation and growth of the baby.  Do not use any tobacco products, including cigarettes, chewing  tobacco, and electronic cigarettes. If you need help quitting, ask your health care provider. You may receive counseling support and other resources to help you quit.  Follow your health care provider's instructions regarding medicine use. There are medicines that are either safe or unsafe to take during pregnancy.  Exercise only as directed by your health care provider. Experiencing uterine cramps is a good sign to stop exercising.  Continue to eat regular, healthy meals.  Wear a good support bra for breast tenderness.  Do not use hot tubs, steam rooms, or saunas.  Wear your seat belt at all times when driving.  Avoid raw meat, uncooked cheese, cat litter boxes, and soil used by cats. These carry germs that can cause birth defects in the baby.  Take your prenatal vitamins.  Take 1500-2000 mg of calcium daily starting at the 20th week of pregnancy until you deliver your baby.  Try taking a stool softener (if your health care provider approves) if you develop constipation. Eat more high-fiber foods, such as fresh vegetables or fruit and whole grains. Drink plenty of fluids to keep your urine clear or pale yellow.  Take warm sitz baths to soothe any pain or discomfort caused by hemorrhoids. Use hemorrhoid cream if your health care provider approves.  If you develop varicose veins, wear support hose. Elevate your feet for 15 minutes, 3-4 times a day. Limit salt in your diet.  Avoid heavy lifting, wear low heel shoes, and practice good posture.  Rest with your legs elevated if you have leg cramps or low back pain.  Visit your dentist if you have not gone yet during your pregnancy. Use a soft toothbrush to brush your teeth and be gentle when you floss.  A sexual relationship may be continued unless your health care provider directs you otherwise.  Continue to go to all your prenatal visits as directed by your health care provider. SEEK MEDICAL CARE IF:   You have dizziness.  You  have mild pelvic cramps, pelvic pressure, or nagging pain in the abdominal area.  You have persistent nausea, vomiting, or diarrhea.  You have a bad smelling vaginal discharge.  You have pain with urination. SEEK IMMEDIATE MEDICAL CARE IF:   You have a fever.  You are leaking fluid from your vagina.  You have spotting or bleeding from your vagina.  You have severe abdominal cramping or pain.  You have rapid weight gain or loss.  You have shortness of breath with chest pain.  You notice sudden or extreme swelling of your face, hands, ankles, feet, or legs.  You have not felt your baby move in over an hour.  You have severe headaches that do not go away with medicine.  You have vision changes.   This information is not intended to replace advice given to you by your health care provider. Make sure you discuss any questions you have with your health care provider.   Document Released: 01/11/2001 Document Revised: 02/07/2014 Document Reviewed: 03/20/2012 Elsevier Interactive Patient Education 2016 Elsevier Inc.  

## 2015-09-08 NOTE — Progress Notes (Signed)
Subjective:  [name] is a 35 y.o. [id number] at 5235w6d being seen today for ongoing prenatal care.  She is currently monitored for the following issues for this low-risk pregnancy and has Morbid obesity (HCC); Depression with anxiety; Previous cesarean delivery, antepartum; Supervision of normal pregnancy; Obesity in pregnancy, antepartum; Headache in pregnancy, antepartum; and Rubella non-immune status, antepartum on her problem list.  Patient reports continued migraines, not alleviated by Fioricet.   . Vag. Bleeding: None.   . Denies leaking of fluid.   The following portions of the patient's history were reviewed and updated as appropriate: allergies, current medications, past family history, past medical history, past social history, past surgical history and problem list. Problem list updated.  Objective:   Vitals:   09/08/15 0905  BP: 121/70  Pulse: 80  Weight: 266 lb (120.7 kg)    Fetal Status: Fetal Heart Rate (bpm): 130         General:  Alert, oriented and cooperative. Patient is in no acute distress.  Skin: Skin is warm and dry. No rash noted.   Cardiovascular: Normal heart rate noted  Respiratory: Normal respiratory effort, no problems with respiration noted  Abdomen: Soft, gravid, appropriate for gestational age. Pain/Pressure: Present     Pelvic:  Cervical exam deferred        Extremities: Normal range of motion.  Edema: Trace  Mental Status: Normal mood and affect. Normal behavior. Normal judgment and thought content.   Urinalysis: Urine Protein: Negative Urine Glucose: Negative  Assessment and Plan:  Pregnancy: [id number] at 6535w6d  1. Obesity in pregnancy, antepartum, second trimester Early 2 hr GTT - Glucose Tolerance, 2 Hours w/1 Hour - US MFM OB COMP + 14 WK; Future  2. Headache in pregnancy, antepartum, first trimester Will do trial of Imitrex. If this does not help, will refer to Headache specialist. Fioricet also refilled. - SUMAtriptan (IMITREX) 100 MG  tablet; Take 1 tablet (100 mg total) by mouth once as needed for migraine. May repeat in 2 hours if headache persists or recurs.  Dispense: 9 tablet; Refill: 11  3. Supervision of normal pregnancy, second trimester Normal first screen. AFP done today. Anatomy scan ordered at MFM. - Glucose Tolerance, 2 Hours w/1 Hour - AFP, Quad Screen - US MFM OB COMP + 14 WK; Future No other complaints or concerns.  Routine obstetric precautions reviewed. Please refer to After Visit Summary for other counseling recommendations.  Return in about 4 weeks (around 10/06/2015) for OB Visit.   Tereso NewcomerUgonna A Ladonte Verstraete, MD

## 2015-09-11 LAB — AFP, QUAD SCREEN
DIA Mom Value: 0.67
DIA Value (EIA): 94.07 pg/mL
DSR (BY AGE) 1 IN: 436
DSR (SECOND TRIMESTER) 1 IN: 9576
Gestational Age: 15 WEEKS
MSAFP MOM: 1.81
MSAFP: 39.7 ng/mL
MSHCG Mom: 0.61
MSHCG: 23834 m[IU]/mL
Maternal Age At EDD: 33.1 YEARS
Osb Risk: 2495
PDF: 0
TEST RESULTS AFP: NEGATIVE
UE3 MOM: 1.05
WEIGHT: 266 [lb_av]
uE3 Value: 0.54 ng/mL

## 2015-09-11 LAB — GLUCOSE TOLERANCE, 2 HOURS W/ 1HR
GLUCOSE, 1 HOUR: 112 mg/dL (ref 65–179)
GLUCOSE, 2 HOUR: 81 mg/dL (ref 65–152)
GLUCOSE, FASTING: 86 mg/dL (ref 65–91)

## 2015-09-14 ENCOUNTER — Other Ambulatory Visit: Payer: Medicaid Other

## 2015-10-06 ENCOUNTER — Ambulatory Visit (HOSPITAL_COMMUNITY)
Admission: RE | Admit: 2015-10-06 | Discharge: 2015-10-06 | Disposition: A | Discharge status: Home / Self Care | Payer: Medicaid Other | Source: Ambulatory Visit | Attending: Obstetrics & Gynecology | Admitting: Obstetrics & Gynecology

## 2015-10-06 ENCOUNTER — Other Ambulatory Visit: Payer: Self-pay | Admitting: Obstetrics & Gynecology

## 2015-10-06 ENCOUNTER — Encounter (HOSPITAL_COMMUNITY): Payer: Self-pay

## 2015-10-06 ENCOUNTER — Encounter: Payer: Medicaid Other | Admitting: Obstetrics & Gynecology

## 2015-10-06 DIAGNOSIS — Z1389 Encounter for screening for other disorder: Secondary | ICD-10-CM

## 2015-10-06 DIAGNOSIS — O99212 Obesity complicating pregnancy, second trimester: Secondary | ICD-10-CM | POA: Diagnosis present

## 2015-10-06 DIAGNOSIS — O9989 Other specified diseases and conditions complicating pregnancy, childbirth and the puerperium: Secondary | ICD-10-CM

## 2015-10-06 DIAGNOSIS — O26891 Other specified pregnancy related conditions, first trimester: Secondary | ICD-10-CM

## 2015-10-06 DIAGNOSIS — Z36 Encounter for antenatal screening of mother: Secondary | ICD-10-CM | POA: Diagnosis not present

## 2015-10-06 DIAGNOSIS — Z3A18 18 weeks gestation of pregnancy: Secondary | ICD-10-CM

## 2015-10-06 DIAGNOSIS — E669 Obesity, unspecified: Secondary | ICD-10-CM | POA: Insufficient documentation

## 2015-10-06 DIAGNOSIS — Z3492 Encounter for supervision of normal pregnancy, unspecified, second trimester: Secondary | ICD-10-CM

## 2015-10-06 DIAGNOSIS — O34219 Maternal care for unspecified type scar from previous cesarean delivery: Secondary | ICD-10-CM

## 2015-10-06 DIAGNOSIS — Z283 Underimmunization status: Secondary | ICD-10-CM

## 2015-10-06 DIAGNOSIS — R51 Headache: Secondary | ICD-10-CM

## 2015-10-06 DIAGNOSIS — O09899 Supervision of other high risk pregnancies, unspecified trimester: Secondary | ICD-10-CM

## 2015-10-07 ENCOUNTER — Other Ambulatory Visit (HOSPITAL_COMMUNITY): Payer: Self-pay | Admitting: *Deleted

## 2015-10-07 DIAGNOSIS — IMO0002 Reserved for concepts with insufficient information to code with codable children: Secondary | ICD-10-CM

## 2015-10-07 DIAGNOSIS — Z0489 Encounter for examination and observation for other specified reasons: Secondary | ICD-10-CM

## 2015-10-08 ENCOUNTER — Ambulatory Visit (INDEPENDENT_AMBULATORY_CARE_PROVIDER_SITE_OTHER): Payer: Medicaid Other | Admitting: Obstetrics & Gynecology

## 2015-10-08 VITALS — BP 118/76 | HR 90 | Temp 98.2°F | Wt 275.0 lb

## 2015-10-08 DIAGNOSIS — Z3492 Encounter for supervision of normal pregnancy, unspecified, second trimester: Secondary | ICD-10-CM

## 2015-10-08 DIAGNOSIS — O99212 Obesity complicating pregnancy, second trimester: Secondary | ICD-10-CM

## 2015-10-08 DIAGNOSIS — O34219 Maternal care for unspecified type scar from previous cesarean delivery: Secondary | ICD-10-CM

## 2015-10-08 NOTE — Progress Notes (Signed)
   PRENATAL VISIT NOTE  Subjective:  [name] is a 35 y.o. [id number] at 8963w1d being seen today for ongoing prenatal care.  She is currently monitored for the following issues for this low-risk pregnancy and has Morbid obesity (HCC); Depression with anxiety; Previous cesarean delivery, antepartum; Supervision of normal pregnancy; Obesity in pregnancy, antepartum; Headache in pregnancy, antepartum; and Rubella non-immune status, antepartum on her problem list.  Patient reports no complaints.  Contractions: Not present. Vag. Bleeding: None.  Movement: Present. Denies leaking of fluid.   The following portions of the patient's history were reviewed and updated as appropriate: allergies, current medications, past family history, past medical history, past social history, past surgical history and problem list. Problem list updated.  Objective:   Vitals:   10/08/15 0834  BP: 118/76  Pulse: 90  Temp: 98.2 F (36.8 C)  Weight: 275 lb (124.7 kg)    Fetal Status: Fetal Heart Rate (bpm): 145   Movement: Present     General:  Alert, oriented and cooperative. Patient is in no acute distress.  Skin: Skin is warm and dry. No rash noted.   Cardiovascular: Normal heart rate noted  Respiratory: Normal respiratory effort, no problems with respiration noted  Abdomen: Soft, gravid, appropriate for gestational age.       Pelvic:  Cervical exam deferred        Extremities: Normal range of motion.  Edema: Trace  Mental Status: Normal mood and affect. Normal behavior. Normal judgment and thought content.   Urinalysis: Urine Protein: Negative Urine Glucose: Negative  Assessment and Plan:  Pregnancy: [id number] at 263w1d  1. Previous cesarean delivery, antepartum TOLAC vs RCS counseling done again; consent given to her to review at home on 08/17/2015 and today 10/08/15. Still deciding. Will ask during subsequent visits.  2. Obesity in pregnancy, antepartum, second trimester Normal early 2 hr GTT. Will  continue to monitor total weight gain closely.   3. Supervision of normal pregnancy, second trimester Had normal sequential screening and anatomy scan was limited (unable to visualize RVOT, DA). Rescan already scheduled at MFM.  No other complaints or concerns.  Routine obstetric precautions reviewed. Please refer to After Visit Summary for other counseling recommendations.  Return in about 4 weeks (around 11/05/2015) for OB Visit.  Tereso NewcomerUgonna A Maisee Vollman, MD

## 2015-10-08 NOTE — Patient Instructions (Signed)
Return to clinic for any scheduled appointments or obstetric concerns, or go to [facility] for evaluation  

## 2015-10-16 ENCOUNTER — Emergency Department (HOSPITAL_COMMUNITY)
Admission: EM | Admit: 2015-10-16 | Discharge: 2015-10-16 | Disposition: A | Discharge status: Home / Self Care | Payer: Medicaid Other | Attending: Emergency Medicine | Admitting: Emergency Medicine

## 2015-10-16 ENCOUNTER — Encounter (HOSPITAL_COMMUNITY): Payer: Self-pay | Admitting: Emergency Medicine

## 2015-10-16 DIAGNOSIS — H00032 Abscess of right lower eyelid: Secondary | ICD-10-CM | POA: Insufficient documentation

## 2015-10-16 DIAGNOSIS — L0291 Cutaneous abscess, unspecified: Secondary | ICD-10-CM

## 2015-10-16 MED ORDER — MUPIROCIN CALCIUM 2 % EX CREA: 1.0000 "application " | TOPICAL_CREAM | Freq: Two times a day (BID) | CUTANEOUS | 0 refills | Status: DC

## 2015-10-16 NOTE — ED Triage Notes (Signed)
Pt with increasingly red and painful abscess under R eye with vision changes. Pt is [redacted] weeks pregnant. NAD. Pain 7/10.

## 2015-10-16 NOTE — Discharge Instructions (Signed)
Return to the ED with any concerns including fever, increased area of redness, eye pain, redness of eye itself, change in vision, pain with moving eye around, or any other alarming symptoms  You should use warm compresses three times daily

## 2015-10-16 NOTE — ED Notes (Signed)
Pt checking child in and with child at this time

## 2015-10-16 NOTE — ED Provider Notes (Signed)
[facility]ENCY DEPT Provider Note   CSN: [id number] Arrival date & time: 10/16/15  1157     History   Chief Complaint Chief Complaint  Patient presents with  . Abscess    HPI [name] is a 35 y.o. female.  HPI  Pt presents with c/o area of swelling and redness underneath right eyelid.  Pt states symptoms started a couple of days ago.  This morning she felt her lower lid was swollen.  No blurry vision.  She has seen some drainage from area when she applies warm compress.  No fever.  No redness of eye itself.  Area is tender.  No other areas of swelling.  No pain with movements of eye. There are no other associated systemic symptoms, there are no other alleviating or modifying factors.   Past Medical History:  Diagnosis Date  . Depression with anxiety 07/04/2013  . Heart murmur   . Morbid obesity (HCC) 03/21/2013  . Postpartum depression    After fetal loss in 2005 due to anomalies    Patient Active Problem List   Diagnosis Date Noted  . Rubella non-immune status, antepartum 08/18/2015  . Previous cesarean delivery, antepartum 08/17/2015  . Supervision of normal pregnancy 08/17/2015  . Obesity in pregnancy, antepartum 08/17/2015  . Headache in pregnancy, antepartum 08/17/2015  . Depression with anxiety 07/04/2013  . Morbid obesity (HCC) 03/21/2013    Past Surgical History:  Procedure Laterality Date  . BREAST SURGERY  2005   L breast infection after loss  . CESAREAN SECTION  02/24/2011   Procedure: CESAREAN SECTION;  Surgeon: Brock Badharles A Harper, MD;  Location: [facility];  Service: Gynecology;  Laterality: N/A;  Primary, cord ph 7.29    OB History    Gravida Para Term Preterm AB Living   7 3 3  0 3 2   SAB TAB Ectopic Multiple Live Births   2 1 0 0 3       Home Medications    Prior to Admission medications   Medication Sig Start Date End Date Taking? Authorizing Provider  acetaminophen (TYLENOL) 500 MG tablet Take 1,000 mg by mouth every 6 (six) hours as needed  for mild pain or headache.    Historical Provider, MD  Butalbital-APAP-Caffeine [phone] MG capsule Take 1-2 capsules by mouth every 6 (six) hours as needed for headache. 09/08/15   Tereso NewcomerUgonna A Anyanwu, MD  mupirocin cream (BACTROBAN) 2 % Apply 1 application topically 2 (two) times daily. 10/16/15   Jerelyn ScottMartha Linker, MD  Prenatal Vit-Fe Fumarate-FA (PRENATAL MULTIVITAMIN) TABS tablet Take 1 tablet by mouth daily at 12 noon.    Historical Provider, MD  SUMAtriptan (IMITREX) 100 MG tablet Take 1 tablet (100 mg total) by mouth once as needed for migraine. May repeat in 2 hours if headache persists or recurs. Patient not taking: Reported on 10/06/2015 09/08/15   Tereso NewcomerUgonna A Anyanwu, MD    Family History Family History  Problem Relation Age of Onset  . Diabetes Maternal Aunt   . Breast cancer Maternal Aunt     2 maternal aunts with breast cancer diagnosed at 6230 and 33 yo  . Cancer Maternal Grandfather     lung  . Colon cancer Maternal Grandfather     died in his 1460's.     Social History Social History  Substance Use Topics  . Smoking status: Never Smoker  . Smokeless tobacco: Never Used  . Alcohol use Yes     Comment: occasional, social drinking  Allergies   Codeine   Review of Systems Review of Systems  ROS reviewed and all otherwise negative except for mentioned in HPI   Physical Exam Updated Vital Signs BP 122/68 (BP Location: Right Arm)   Pulse 85   Temp 98.3 F (36.8 C)   Resp 16   Wt 120.7 kg   LMP 05/27/2015 (Exact Date)   SpO2 99%   BMI 42.93 kg/m  Vitals reviewed Physical Exam Physical Examination: General appearance - alert, well appearing, and in no distress Mental status - alert, oriented to person, place, and time Eyes - pupils equal and reactive, extraocular eye movements intact, no conjunctival injection, no erythema surrounding eye- approx 1cm below right lower lid there is approx 4mm nodule that is red and tender, not fluctuant, no pain with eye movements, no  proptosis Neck - supple, no significant adenopathy Chest - clear to auscultation, no wheezes, rales or rhonchi, symmetric air entry Heart - normal rate, regular rhythm, normal S1, S2, no murmurs, rubs, clicks or gallops Neurological - alert, oriented x 3, normal speech Extremities - peripheral pulses normal, no pedal edema, no clubbing or cyanosis Skin - normal coloration and turgor, no rashes, other than erythema inferior to rigth lower lid as described above  ED Treatments / Results  Labs (all labs ordered are listed, but only abnormal results are displayed) Labs Reviewed - No data to display  EKG  EKG Interpretation None       Radiology No results found.  Procedures Procedures (including critical care time)  Medications Ordered in ED Medications - No data to display   Initial Impression / Assessment and Plan / ED Course  I have reviewed the triage vital signs and the nursing notes.  Pertinent labs & imaging results that were available during my care of the patient were reviewed by me and considered in my medical decision making (see chart for details).  Clinical Course    Pt presenting with very small abscess under right lower lid- no sign of preseptal or orbital cellulitis.  Nonfluctuant.  Pt is pregnant so I feel this will be amenable to warm compress and bactroban.  Pt is agreeable with this plan- given strict return precautions.  Area should be rechecked in the next 2 days.  Discharged with strict return precautions.  Pt agreeable with plan.  Final Clinical Impressions(s) / ED Diagnoses   Final diagnoses:  Abscess    New Prescriptions Discharge Medication List as of 10/16/2015  1:16 PM    START taking these medications   Details  mupirocin cream (BACTROBAN) 2 % Apply 1 application topically 2 (two) times daily., Starting Fri 10/16/2015, Print         Jerelyn Scott, MD 10/17/15 [phone]

## 2015-10-18 ENCOUNTER — Encounter (HOSPITAL_COMMUNITY): Payer: Self-pay | Admitting: *Deleted

## 2015-10-18 ENCOUNTER — Inpatient Hospital Stay (HOSPITAL_COMMUNITY)
Admission: AD | Admit: 2015-10-18 | Discharge: 2015-10-18 | Disposition: A | Discharge status: Home / Self Care | Payer: Medicaid Other | Source: Ambulatory Visit | Attending: Obstetrics and Gynecology | Admitting: Obstetrics and Gynecology

## 2015-10-18 DIAGNOSIS — O219 Vomiting of pregnancy, unspecified: Secondary | ICD-10-CM | POA: Insufficient documentation

## 2015-10-18 DIAGNOSIS — O34219 Maternal care for unspecified type scar from previous cesarean delivery: Secondary | ICD-10-CM

## 2015-10-18 DIAGNOSIS — O99342 Other mental disorders complicating pregnancy, second trimester: Secondary | ICD-10-CM | POA: Insufficient documentation

## 2015-10-18 DIAGNOSIS — R51 Headache: Secondary | ICD-10-CM

## 2015-10-18 DIAGNOSIS — Z885 Allergy status to narcotic agent status: Secondary | ICD-10-CM | POA: Diagnosis not present

## 2015-10-18 DIAGNOSIS — Z3A20 20 weeks gestation of pregnancy: Secondary | ICD-10-CM | POA: Diagnosis present

## 2015-10-18 DIAGNOSIS — O9989 Other specified diseases and conditions complicating pregnancy, childbirth and the puerperium: Secondary | ICD-10-CM | POA: Diagnosis not present

## 2015-10-18 DIAGNOSIS — O99212 Obesity complicating pregnancy, second trimester: Secondary | ICD-10-CM | POA: Diagnosis not present

## 2015-10-18 DIAGNOSIS — R109 Unspecified abdominal pain: Secondary | ICD-10-CM | POA: Insufficient documentation

## 2015-10-18 DIAGNOSIS — Z3492 Encounter for supervision of normal pregnancy, unspecified, second trimester: Secondary | ICD-10-CM

## 2015-10-18 DIAGNOSIS — R197 Diarrhea, unspecified: Secondary | ICD-10-CM | POA: Diagnosis not present

## 2015-10-18 DIAGNOSIS — N760 Acute vaginitis: Secondary | ICD-10-CM

## 2015-10-18 DIAGNOSIS — Z9889 Other specified postprocedural states: Secondary | ICD-10-CM | POA: Insufficient documentation

## 2015-10-18 DIAGNOSIS — O26891 Other specified pregnancy related conditions, first trimester: Secondary | ICD-10-CM

## 2015-10-18 DIAGNOSIS — B9689 Other specified bacterial agents as the cause of diseases classified elsewhere: Secondary | ICD-10-CM

## 2015-10-18 DIAGNOSIS — Z283 Underimmunization status: Secondary | ICD-10-CM

## 2015-10-18 DIAGNOSIS — O26892 Other specified pregnancy related conditions, second trimester: Secondary | ICD-10-CM | POA: Insufficient documentation

## 2015-10-18 DIAGNOSIS — A084 Viral intestinal infection, unspecified: Secondary | ICD-10-CM

## 2015-10-18 DIAGNOSIS — O09899 Supervision of other high risk pregnancies, unspecified trimester: Secondary | ICD-10-CM

## 2015-10-18 LAB — CBC WITH DIFFERENTIAL/PLATELET
BASOS ABS: 0 10*3/uL (ref 0.0–0.1)
Basophils Relative: 0 %
EOS PCT: 0 %
Eosinophils Absolute: 0 10*3/uL (ref 0.0–0.7)
HCT: 31.8 % — ABNORMAL LOW (ref 36.0–46.0)
HEMOGLOBIN: 10.9 g/dL — AB (ref 12.0–15.0)
LYMPHS ABS: 0.8 10*3/uL (ref 0.7–4.0)
LYMPHS PCT: 9 %
MCH: 25.3 pg — AB (ref 26.0–34.0)
MCHC: 34.3 g/dL (ref 30.0–36.0)
MCV: 73.8 fL — AB (ref 78.0–100.0)
Monocytes Absolute: 0.1 10*3/uL (ref 0.1–1.0)
Monocytes Relative: 2 %
NEUTROS ABS: 7.8 10*3/uL — AB (ref 1.7–7.7)
NEUTROS PCT: 90 %
PLATELETS: 215 10*3/uL (ref 150–400)
RBC: 4.31 MIL/uL (ref 3.87–5.11)
RDW: 13.4 % (ref 11.5–15.5)
WBC: 8.7 10*3/uL (ref 4.0–10.5)

## 2015-10-18 LAB — WET PREP, GENITAL
Sperm: NONE SEEN
Trich, Wet Prep: NONE SEEN
YEAST WET PREP: NONE SEEN

## 2015-10-18 LAB — URINALYSIS, ROUTINE W REFLEX MICROSCOPIC
Bilirubin Urine: NEGATIVE
Glucose, UA: NEGATIVE mg/dL
Hgb urine dipstick: NEGATIVE
Ketones, ur: 15 mg/dL — AB
NITRITE: NEGATIVE
PROTEIN: NEGATIVE mg/dL
SPECIFIC GRAVITY, URINE: 1.02 (ref 1.005–1.030)
pH: 6 (ref 5.0–8.0)

## 2015-10-18 LAB — URINE MICROSCOPIC-ADD ON: RBC / HPF: NONE SEEN RBC/hpf (ref 0–5)

## 2015-10-18 MED ORDER — ONDANSETRON 8 MG PO TBDP: 8.0000 mg | ORAL_TABLET | Freq: Three times a day (TID) | ORAL | 0 refills | Status: DC | PRN

## 2015-10-18 MED ORDER — ONDANSETRON 8 MG PO TBDP
8.0000 mg | ORAL_TABLET | Freq: Once | ORAL | Status: AC
  Administered 2015-10-18: 8 mg via ORAL
  Filled 2015-10-18 (×2): qty 1

## 2015-10-18 MED ORDER — BUTALBITAL-APAP-CAFFEINE 50-325-40 MG PO TABS
2.0000 | ORAL_TABLET | Freq: Once | ORAL | Status: AC
  Administered 2015-10-18: 2 via ORAL
  Filled 2015-10-18: qty 2

## 2015-10-18 MED ORDER — METRONIDAZOLE 500 MG PO TABS: 500.0000 mg | ORAL_TABLET | Freq: Two times a day (BID) | ORAL | 0 refills | Status: DC

## 2015-10-18 NOTE — Discharge Instructions (Signed)

## 2015-10-18 NOTE — MAU Note (Signed)
Vomiting x4/ diarrhea x6 since this am.  Child at home sick

## 2015-10-18 NOTE — MAU Provider Note (Signed)
History   [name] is a 35 yo, [id number] @ 20.4 wks who presents with abd pain, nausea, vomiting, and diarrhea since 0530. Pt states her son has been sick this past week.   CSN: [id number]  Arrival date & time 10/18/15  1105   First Provider Initiated Contact with Patient 10/18/15 1126      Chief Complaint  Patient presents with  . Nausea  . Diarrhea  . Emesis    HPI  Past Medical History:  Diagnosis Date  . Depression with anxiety 07/04/2013  . Heart murmur   . Morbid obesity (HCC) 03/21/2013  . 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 BREAST SURGERY  2005   L breast infection after loss  . CESAREAN SECTION  02/24/2011   Procedure: CESAREAN SECTION;  Surgeon: Brock Bad, MD;  Location: [facility];  Service: Gynecology;  Laterality: N/A;  Primary, cord ph 7.29    Family History  Problem Relation Age of Onset  . Diabetes Maternal Aunt   . Breast cancer Maternal Aunt     2 maternal aunts with breast cancer diagnosed at 79 and 33 yo  . Cancer Maternal Grandfather     lung  . Colon cancer Maternal Grandfather     died in his 38's.     Social History  Substance Use Topics  . Smoking status: Never Smoker  . Smokeless tobacco: Never Used  . Alcohol use Yes     Comment: occasional, social drinking    OB History    Gravida Para Term Preterm AB Living   7 3 3  0 3 2   SAB TAB Ectopic Multiple Live Births   2 1 0 0 3      Review of Systems  Constitutional: Negative.   HENT: Negative.   Eyes: Negative.   Respiratory: Negative.   Cardiovascular: Negative.   Gastrointestinal: Positive for abdominal pain, diarrhea, nausea and vomiting.  Endocrine: Negative.   Genitourinary: Negative.   Musculoskeletal: Negative.   Skin: Negative.   Allergic/Immunologic: Negative.   Neurological: Negative.   Hematological: Negative.   Psychiatric/Behavioral: Negative.     Allergies   Codeine  Home Medications    Temp 97.8 F (36.6 C)   Resp 20   LMP 05/27/2015 (Exact Date)   Physical Exam  Constitutional: She is oriented to person, place, and time. She appears well-developed and well-nourished.  HENT:  Head: Normocephalic.  Eyes: Pupils are equal, round, and reactive to light.  Neck: Normal range of motion.  Cardiovascular: Normal rate and regular rhythm.   Pulmonary/Chest: Effort normal and breath sounds normal.  Abdominal: Soft. Bowel sounds are normal.  Genitourinary: Vagina normal and uterus normal.  Musculoskeletal: Normal range of motion.  Neurological: She is alert and oriented to person, place, and time.  Skin: Skin is warm and dry.  Psychiatric: She has a normal mood and affect. Her behavior is normal. Judgment and thought content normal.    MAU Course  Procedures (including critical care time)  Labs Reviewed  WET PREP, GENITAL  CULTURE, BETA STREP (GROUP B ONLY)  URINALYSIS, ROUTINE W REFLEX MICROSCOPIC (NOT AT [facility])  CBC WITH DIFFERENTIAL/PLATELET  GC/CHLAMYDIA PROBE AMP ([place]) NOT AT [facility]   No results found.   1. Rubella non-immune status, antepartum   2. Previous cesarean delivery, antepartum   3. Supervision of normal pregnancy, second trimester   4. Obesity in  pregnancy, antepartum, second trimester   5. Headache in pregnancy, antepartum, first trimester       MDM  1. Gastroenteritis   Plan: Obtain wet prep, GC/CMZ, CBC, GBS  Administer Zofran ODT, discharge home with diet instructions, f/u in clinic for reg OB visit

## 2015-10-19 LAB — GC/CHLAMYDIA PROBE AMP (~~LOC~~) NOT AT ARMC
CHLAMYDIA, DNA PROBE: NEGATIVE
NEISSERIA GONORRHEA: NEGATIVE

## 2015-10-20 LAB — CULTURE, BETA STREP (GROUP B ONLY)

## 2015-10-28 LAB — PROCEDURE REPORT - SCANNED: PAP SMEAR: NEGATIVE

## 2015-11-03 ENCOUNTER — Ambulatory Visit: Payer: Medicaid Other | Admitting: Obstetrics and Gynecology

## 2015-11-03 ENCOUNTER — Ambulatory Visit (HOSPITAL_COMMUNITY)
Admission: RE | Admit: 2015-11-03 | Discharge: 2015-11-03 | Disposition: A | Discharge status: Home / Self Care | Payer: Medicaid Other | Source: Ambulatory Visit | Attending: Obstetrics & Gynecology | Admitting: Obstetrics & Gynecology

## 2015-11-03 ENCOUNTER — Encounter (HOSPITAL_COMMUNITY): Payer: Self-pay

## 2015-11-03 VITALS — BP 117/73 | HR 85 | Temp 98.6°F | Wt 273.0 lb

## 2015-11-03 DIAGNOSIS — O34219 Maternal care for unspecified type scar from previous cesarean delivery: Secondary | ICD-10-CM

## 2015-11-03 DIAGNOSIS — O99212 Obesity complicating pregnancy, second trimester: Secondary | ICD-10-CM | POA: Diagnosis present

## 2015-11-03 DIAGNOSIS — Z3402 Encounter for supervision of normal first pregnancy, second trimester: Secondary | ICD-10-CM

## 2015-11-03 DIAGNOSIS — Z3A22 22 weeks gestation of pregnancy: Secondary | ICD-10-CM | POA: Insufficient documentation

## 2015-11-03 DIAGNOSIS — Z0489 Encounter for examination and observation for other specified reasons: Secondary | ICD-10-CM

## 2015-11-03 DIAGNOSIS — IMO0002 Reserved for concepts with insufficient information to code with codable children: Secondary | ICD-10-CM

## 2015-11-03 DIAGNOSIS — Z362 Encounter for other antenatal screening follow-up: Secondary | ICD-10-CM | POA: Diagnosis not present

## 2015-11-03 NOTE — Progress Notes (Signed)
Subjective:  [name] is a 35 y.o. [id number] at 6830w6d being seen today for ongoing prenatal care.  She is currently monitored for the following issues for this low-risk pregnancy and has Morbid obesity (HCC); Depression with anxiety; Previous cesarean delivery, antepartum; Supervision of normal pregnancy; Obesity in pregnancy, antepartum; Headache in pregnancy, antepartum; and Rubella non-immune status, antepartum on her problem list.  Patient reports no complaints.  Contractions: Irritability.  .  Movement: Present. Denies leaking of fluid.   The following portions of the patient's history were reviewed and updated as appropriate: allergies, current medications, past family history, past medical history, past social history, past surgical history and problem list. Problem list updated.  Objective:   Vitals:   11/03/15 1023  BP: 117/73  Pulse: 85  Temp: 98.6 F (37 C)  Weight: 273 lb (123.8 kg)    Fetal Status:     Movement: Present     General:  Alert, oriented and cooperative. Patient is in no acute distress.  Skin: Skin is warm and dry. No rash noted.   Cardiovascular: Normal heart rate noted  Respiratory: Normal respiratory effort, no problems with respiration noted  Abdomen: Soft, gravid, appropriate for gestational age. Pain/Pressure: Absent     Pelvic:  Cervical exam deferred        Extremities: Normal range of motion.  Edema: Trace  Mental Status: Normal mood and affect. Normal behavior. Normal judgment and thought content.   Urinalysis:      Assessment and Plan:  Pregnancy: [id number] at 3330w6d  1. Encounter for supervision of normal first pregnancy in second trimester Decline Flu vaccine  2. Previous cesarean delivery, antepartum TOLAC consent signed today  Preterm labor symptoms and general obstetric precautions including but not limited to vaginal bleeding, contractions, leaking of fluid and fetal movement were reviewed in detail with the patient. Please refer to  After Visit Summary for other counseling recommendations.  No Follow-up on file.   Hermina StaggersMichael L Makenzie Vittorio, MD

## 2015-11-03 NOTE — Progress Notes (Signed)
Patient states that she occasionally feels pressure along with irregular irritability, but no bleeding and reports good fetal movement. Patient declined flu vaccine.

## 2015-11-04 ENCOUNTER — Encounter: Payer: Self-pay | Admitting: Obstetrics and Gynecology

## 2015-11-04 ENCOUNTER — Other Ambulatory Visit (HOSPITAL_COMMUNITY): Payer: Self-pay | Admitting: *Deleted

## 2015-11-04 DIAGNOSIS — O99210 Obesity complicating pregnancy, unspecified trimester: Secondary | ICD-10-CM

## 2015-12-01 ENCOUNTER — Encounter: Payer: Self-pay | Admitting: Obstetrics & Gynecology

## 2015-12-01 ENCOUNTER — Other Ambulatory Visit: Payer: Medicaid Other

## 2015-12-01 ENCOUNTER — Ambulatory Visit (INDEPENDENT_AMBULATORY_CARE_PROVIDER_SITE_OTHER): Payer: Medicaid Other | Admitting: Obstetrics & Gynecology

## 2015-12-01 DIAGNOSIS — Z3402 Encounter for supervision of normal first pregnancy, second trimester: Secondary | ICD-10-CM

## 2015-12-01 DIAGNOSIS — Z3400 Encounter for supervision of normal first pregnancy, unspecified trimester: Secondary | ICD-10-CM

## 2015-12-01 DIAGNOSIS — O34219 Maternal care for unspecified type scar from previous cesarean delivery: Secondary | ICD-10-CM

## 2015-12-01 DIAGNOSIS — O09299 Supervision of pregnancy with other poor reproductive or obstetric history, unspecified trimester: Secondary | ICD-10-CM

## 2015-12-01 DIAGNOSIS — O09292 Supervision of pregnancy with other poor reproductive or obstetric history, second trimester: Secondary | ICD-10-CM

## 2015-12-01 NOTE — Patient Instructions (Signed)
Vaginal Birth After Cesarean Delivery  Vaginal birth after cesarean delivery (VBAC) is giving birth vaginally after previously delivering a baby by a cesarean. In the past, if a woman had a cesarean delivery, all births afterward would be done by cesarean delivery. This is no longer true. It can be safe for the mother to try a vaginal delivery after having a cesarean delivery.   It is important to discuss VBAC with your health care provider early in the pregnancy so you can understand the risks, benefits, and options. It will give you time to decide what is best in your particular case. The final decision about whether to have a VBAC or repeat cesarean delivery should be between you and your health care provider. Any changes in your health or your baby's health during your pregnancy may make it necessary to change your initial decision about VBAC.   WOMEN WHO PLAN TO HAVE A VBAC SHOULD CHECK WITH THEIR HEALTH CARE PROVIDER TO BE SURE THAT:  · The previous cesarean delivery was done with a low transverse uterine cut (incision) (not a vertical classical incision).    · The birth canal is big enough for the baby.    · There were no other operations on the uterus.    · An electronic fetal monitor (EFM) will be on at all times during labor.    · An operating room will be available and ready in case an emergency cesarean delivery is needed.    · A health care provider and surgical nursing staff will be available at all times during labor to be ready to do an emergency delivery cesarean if necessary.    · An anesthesiologist will be present in case an emergency cesarean delivery is needed.    · The nursery is prepared and has adequate personnel and necessary equipment available to care for the baby in case of an emergency cesarean delivery.  BENEFITS OF VBAC  · Shorter stay in the hospital.    · Avoidance of risks associated with cesarean delivery, such as:    Surgical complications, such as opening of the incision or  hernia in the incision.    Injury to other organs.    Fever. This can occur if an infection develops after surgery. It can also occur as a reaction to the medicine given to make you numb during the surgery.  · Less blood loss and need for blood transfusions.  · Lower risk of blood clots and infection.   · Shorter recovery.    · Decreased risk for having to remove the uterus (hysterectomy).    · Decreased risk for the placenta to completely or partially cover the opening of the uterus (placenta previa) with a future pregnancy.    · Decrease risk in future labor and delivery.  RISKS OF A VBAC  · Tearing (rupture) of the uterus. This is occurs in less than 1% of VBACs. The risk of this happening is higher if:    Steps are taken to begin the labor process (induce labor) or stimulate or strengthen contractions (augment labor).      Medicine is used to soften (ripen) the cervix.  · Having to remove the uterus (hysterectomy) if it ruptures.  VBAC SHOULD NOT BE DONE IF:  · The previous cesarean delivery was done with a vertical (classical) or T-shaped incision or you do not know what kind of incision was made.    · You had a ruptured uterus.    · You have had certain types of surgery on your uterus, such as removal of uterine fibroids.   Ask your health care provider about other types of surgeries that prevent you from having a VBAC.  · You have certain medical or childbirth (obstetrical) problems.    · There are problems with the baby.    · You have had two previous cesarean deliveries and no vaginal deliveries.  OTHER FACTS TO KNOW ABOUT VBAC:  · It is safe to have an epidural anesthetic with VBAC.    · It is safe to turn the baby from a breech position (attempt an external cephalic version).    · It is safe to try a VBAC with twins.    · VBAC may not be successful if your baby weights 8.8 lb (4 kg) or more. However, weight predictions are not always accurate and should not be used alone to decide if VBAC is right for  you.  · There is an increased failure rate if the time between the cesarean delivery and VBAC is less than 19 months.    · Your health care provider may advise against a VBAC if you have preeclampsia (high blood pressure, protein in the urine, and swelling of face and extremities).    · VBAC is often successful if you previously gave birth vaginally.    · VBAC is often successful when the labor starts spontaneously before the due date.    · Delivering a baby through a VBAC is similar to having a normal spontaneous vaginal delivery.     This information is not intended to replace advice given to you by your health care provider. Make sure you discuss any questions you have with your health care provider.     Document Released: 07/10/2006 Document Revised: 02/07/2014 Document Reviewed: 08/16/2012  Elsevier Interactive Patient Education ©2016 [employer]

## 2015-12-01 NOTE — Progress Notes (Signed)
   PRENATAL VISIT NOTE  Subjective:  [name] is a 35 y.o. [id number] at 842w6d being seen today for ongoing prenatal care.  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  Patient reports no complaints.  Contractions: Irritability. Vag. Bleeding: None.  Movement: Present. Denies leaking of fluid.   The following portions of the patient's history were reviewed and updated as appropriate: allergies, current medications, past family history, past medical history, past social history, past surgical history and problem list. Problem list updated.  Objective:   Vitals:   12/01/15 0902  BP: 115/81  Pulse: 91  Weight: 284 lb (128.8 kg)    Fetal Status: Fetal Heart Rate (bpm): 145   Movement: Present     General:  Alert, oriented and cooperative. Patient is in no acute distress.  Skin: Skin is warm and dry. No rash noted.   Cardiovascular: Normal heart rate noted  Respiratory: Normal respiratory effort, no problems with respiration noted  Abdomen: Soft, gravid, appropriate for gestational age. Pain/Pressure: Absent     Pelvic:  Cervical exam deferred        Extremities: Normal range of motion.  Edema: Trace  Mental Status: Normal mood and affect. Normal behavior. Normal judgment and thought content.   Assessment and Plan:  Pregnancy: [id number] at 1742w6d  1. Supervision of normal first pregnancy, antepartum Routine screening today  2. 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 Previous cesarean delivery, antepartum Counseled again re risk and benefit of RCS vs. TOLAC, still has not signed  consent. Risk of uterine rupture quoted up to 1%  Preterm labor symptoms and general obstetric precautions including but not limited to vaginal bleeding, contractions, leaking of fluid and fetal movement were reviewed in detail with the patient. Please refer to After Visit Summary for other counseling recommendations.  Return in about 3 weeks (around 12/22/2015).  Erika PhenixJames G Jakera Beaupre, MD

## 2015-12-01 NOTE — Progress Notes (Signed)
Patient states that she feels good today, reports good fetal movement. 

## 2015-12-02 LAB — RPR: RPR Ser Ql: NONREACTIVE

## 2015-12-02 LAB — CBC
HEMATOCRIT: 31.5 % — AB (ref 34.0–46.6)
Hemoglobin: 10.4 g/dL — ABNORMAL LOW (ref 11.1–15.9)
MCH: 24.6 pg — AB (ref 26.6–33.0)
MCHC: 33 g/dL (ref 31.5–35.7)
MCV: 75 fL — AB (ref 79–97)
PLATELETS: 262 10*3/uL (ref 150–379)
RBC: 4.22 x10E6/uL (ref 3.77–5.28)
RDW: 13.5 % (ref 12.3–15.4)
WBC: 9.6 10*3/uL (ref 3.4–10.8)

## 2015-12-02 LAB — GLUCOSE TOLERANCE, 2 HOURS W/ 1HR
GLUCOSE, FASTING: 83 mg/dL (ref 65–91)
Glucose, 1 hour: 128 mg/dL (ref 65–179)
Glucose, 2 hour: 78 mg/dL (ref 65–152)

## 2015-12-02 LAB — HIV ANTIBODY (ROUTINE TESTING W REFLEX): HIV Screen 4th Generation wRfx: NONREACTIVE

## 2015-12-15 ENCOUNTER — Ambulatory Visit (HOSPITAL_COMMUNITY): Payer: Medicaid Other

## 2015-12-18 ENCOUNTER — Encounter (HOSPITAL_COMMUNITY): Payer: Self-pay

## 2015-12-18 ENCOUNTER — Other Ambulatory Visit (HOSPITAL_COMMUNITY): Payer: Self-pay | Admitting: *Deleted

## 2015-12-18 ENCOUNTER — Ambulatory Visit (HOSPITAL_COMMUNITY)
Admission: RE | Admit: 2015-12-18 | Discharge: 2015-12-18 | Disposition: A | Discharge status: Home / Self Care | Payer: Medicaid Other | Source: Ambulatory Visit | Attending: Obstetrics & Gynecology | Admitting: Obstetrics & Gynecology

## 2015-12-18 ENCOUNTER — Other Ambulatory Visit (HOSPITAL_COMMUNITY): Payer: Self-pay | Admitting: Maternal and Fetal Medicine

## 2015-12-18 DIAGNOSIS — O99213 Obesity complicating pregnancy, third trimester: Secondary | ICD-10-CM | POA: Diagnosis present

## 2015-12-18 DIAGNOSIS — Z3A29 29 weeks gestation of pregnancy: Secondary | ICD-10-CM

## 2015-12-18 DIAGNOSIS — O99210 Obesity complicating pregnancy, unspecified trimester: Secondary | ICD-10-CM

## 2015-12-23 ENCOUNTER — Ambulatory Visit (INDEPENDENT_AMBULATORY_CARE_PROVIDER_SITE_OTHER): Payer: Medicaid Other | Admitting: Obstetrics & Gynecology

## 2015-12-23 VITALS — BP 123/82 | HR 106 | Temp 98.0°F | Wt 291.6 lb

## 2015-12-23 DIAGNOSIS — Z3482 Encounter for supervision of other normal pregnancy, second trimester: Secondary | ICD-10-CM

## 2015-12-23 DIAGNOSIS — O34219 Maternal care for unspecified type scar from previous cesarean delivery: Secondary | ICD-10-CM

## 2015-12-23 DIAGNOSIS — Z3400 Encounter for supervision of normal first pregnancy, unspecified trimester: Secondary | ICD-10-CM

## 2015-12-23 NOTE — Progress Notes (Signed)
   PRENATAL VISIT NOTE  Subjective:  [name] is a 35 y.o. [id number] at 3127w0d being seen today for ongoing prenatal care.  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  Patient reports no complaints.  Contractions: Irregular. Vag. Bleeding: None.  Movement: Present. Denies leaking of fluid.   The following portions of the patient's history were reviewed and updated as appropriate: allergies, current medications, past family history, past medical history, past social history, past surgical history and problem list. Problem list updated.  Objective:   Vitals:   12/23/15 0846  BP: 123/82  Pulse: (!) 106  Temp: 98 F (36.7 C)  Weight: 291 lb 9.6 oz (132.3 kg)    Fetal Status: Fetal Heart Rate (bpm): 135 Fundal Height: 31 cm Movement: Present     General:  Alert, oriented and cooperative. Patient is in no acute distress.  Skin: Skin is warm and dry. No rash noted.   Cardiovascular: Normal heart rate noted  Respiratory: Normal respiratory effort, no problems with respiration noted  Abdomen: Soft, gravid, appropriate for gestational age. Pain/Pressure: Present     Pelvic:  Cervical exam deferred        Extremities: Normal range of motion.  Edema: Trace  Mental Status: Normal mood and affect. Normal behavior. Normal judgment and thought content.   Assessment and Plan:  Pregnancy: [id number] at 1527w0d  1. Supervision of normal first pregnancy, antepartum Routine PNC  2. Previous cesarean delivery, antepartum Still considering mode of delivery, leaning toward RCS but would try TOLAC if labor ensues Preterm labor symptoms and  general obstetric precautions including but not limited to vaginal bleeding, contractions, leaking of fluid and fetal movement were reviewed in detail with the patient. Please refer to After Visit Summary for other counseling recommendations.  Return in about 2 weeks (around 01/06/2016).   Adam PhenixJames G Jahmel Flannagan, MD

## 2015-12-23 NOTE — Progress Notes (Signed)
Patient states that's she feels ok, reports good fetal movement.

## 2016-01-13 ENCOUNTER — Ambulatory Visit (INDEPENDENT_AMBULATORY_CARE_PROVIDER_SITE_OTHER): Payer: Medicaid Other | Admitting: Obstetrics & Gynecology

## 2016-01-13 VITALS — BP 127/79 | HR 114 | Wt 295.0 lb

## 2016-01-13 DIAGNOSIS — Z3480 Encounter for supervision of other normal pregnancy, unspecified trimester: Secondary | ICD-10-CM

## 2016-01-13 DIAGNOSIS — Z3483 Encounter for supervision of other normal pregnancy, third trimester: Secondary | ICD-10-CM

## 2016-01-13 NOTE — Patient Instructions (Signed)
Third Trimester of Pregnancy The third trimester is from week 29 through week 40 (months 7 through 9). The third trimester is a time when the unborn baby (fetus) is growing rapidly. At the end of the ninth month, the fetus is about 20 inches in length and weighs 6-10 pounds. Body changes during your third trimester Your body goes through many changes during pregnancy. The changes vary from woman to woman. During the third trimester:  Your weight will continue to increase. You can expect to gain 25-35 pounds (11-16 kg) by the end of the pregnancy.  You may begin to get stretch marks on your hips, abdomen, and breasts.  You may urinate more often because the fetus is moving lower into your pelvis and pressing on your bladder.  You may develop or continue to have heartburn. This is caused by increased hormones that slow down muscles in the digestive tract.  You may develop or continue to have constipation because increased hormones slow digestion and cause the muscles that push waste through your intestines to relax.  You may develop hemorrhoids. These are swollen veins (varicose veins) in the rectum that can itch or be painful.  You may develop swollen, bulging veins (varicose veins) in your legs.  You may have increased body aches in the pelvis, back, or thighs. This is due to weight gain and increased hormones that are relaxing your joints.  You may have changes in your hair. These can include thickening of your hair, rapid growth, and changes in texture. Some women also have hair loss during or after pregnancy, or hair that feels dry or thin. Your hair will most likely return to normal after your baby is born.  Your breasts will continue to grow and they will continue to become tender. A yellow fluid (colostrum) may leak from your breasts. This is the first milk you are producing for your baby.  Your belly button may stick out.  You may notice more swelling in your hands, face, or  ankles.  You may have increased tingling or numbness in your hands, arms, and legs. The skin on your belly may also feel numb.  You may feel short of breath because of your expanding uterus.  You may have more problems sleeping. This can be caused by the size of your belly, increased need to urinate, and an increase in your body's metabolism.  You may notice the fetus "dropping," or moving lower in your abdomen.  You may have increased vaginal discharge.  Your cervix becomes thin and soft (effaced) near your due date. What to expect at prenatal visits You will have prenatal exams every 2 weeks until week 36. Then you will have weekly prenatal exams. During a routine prenatal visit:  You will be weighed to make sure you and the fetus are growing normally.  Your blood pressure will be taken.  Your abdomen will be measured to track your baby's growth.  The fetal heartbeat will be listened to.  Any test results from the previous visit will be discussed.  You may have a cervical check near your due date to see if you have effaced. At around 36 weeks, your health care provider will check your cervix. At the same time, your health care provider will also perform a test on the secretions of the vaginal tissue. This test is to determine if a type of bacteria, Group B streptococcus, is present. Your health care provider will explain this further. Your health care provider may ask you:    What your birth plan is.  How you are feeling.  If you are feeling the baby move.  If you have had any abnormal symptoms, such as leaking fluid, bleeding, severe headaches, or abdominal cramping.  If you are using any tobacco products, including cigarettes, chewing tobacco, and electronic cigarettes.  If you have any questions. Other tests or screenings that may be performed during your third trimester include:  Blood tests that check for low iron levels (anemia).  Fetal testing to check the health,  activity level, and growth of the fetus. Testing is done if you have certain medical conditions or if there are problems during the pregnancy.  Nonstress test (NST). This test checks the health of your baby to make sure there are no signs of problems, such as the baby not getting enough oxygen. During this test, a belt is placed around your belly. The baby is made to move, and its heart rate is monitored during movement. What is false labor? False labor is a condition in which you feel small, irregular tightenings of the muscles in the womb (contractions) that eventually go away. These are called Braxton Hicks contractions. Contractions may last for hours, days, or even weeks before true labor sets in. If contractions come at regular intervals, become more frequent, increase in intensity, or become painful, you should see your health care provider. What are the signs of labor?  Abdominal cramps.  Regular contractions that start at 10 minutes apart and become stronger and more frequent with time.  Contractions that start on the top of the uterus and spread down to the lower abdomen and back.  Increased pelvic pressure and dull back pain.  A watery or bloody mucus discharge that comes from the vagina.  Leaking of amniotic fluid. This is also known as your "water breaking." It could be a slow trickle or a gush. Let your doctor know if it has a color or strange odor. If you have any of these signs, call your health care provider right away, even if it is before your due date. Follow these instructions at home: Eating and drinking  Continue to eat regular, healthy meals.  Do not eat:  Raw meat or meat spreads.  Unpasteurized milk or cheese.  Unpasteurized juice.  Store-made salad.  Refrigerated smoked seafood.  Hot dogs or deli meat, unless they are piping hot.  More than 6 ounces of albacore tuna a week.  Shark, swordfish, king mackerel, or tile fish.  Store-made salads.  Raw  sprouts, such as mung bean or alfalfa sprouts.  Take prenatal vitamins as told by your health care provider.  Take 1000 mg of calcium daily as told by your health care provider.  If you develop constipation:  Take over-the-counter or prescription medicines.  Drink enough fluid to keep your urine clear or pale yellow.  Eat foods that are high in fiber, such as fresh fruits and vegetables, whole grains, and beans.  Limit foods that are high in fat and processed sugars, such as fried and sweet foods. Activity  Exercise only as directed by your health care provider. Healthy pregnant women should aim for 2 hours and 30 minutes of moderate exercise per week. If you experience any pain or discomfort while exercising, stop.  Avoid heavy lifting.  Do not exercise in extreme heat or humidity, or at high altitudes.  Wear low-heel, comfortable shoes.  Practice good posture.  Do not travel far distances unless it is absolutely necessary and only with the approval   of your health care provider.  Wear your seat belt at all times while in a car, on a bus, or on a plane.  Take frequent breaks and rest with your legs elevated if you have leg cramps or low back pain.  Do not use hot tubs, steam rooms, or saunas.  You may continue to have sex unless your health care provider tells you otherwise. Lifestyle  Do not use any products that contain nicotine or tobacco, such as cigarettes and e-cigarettes. If you need help quitting, ask your health care provider.  Do not drink alcohol.  Do not use any medicinal herbs or unprescribed drugs. These chemicals affect the formation and growth of the baby.  If you develop varicose veins:  Wear support pantyhose or compression stockings as told by your healthcare provider.  Elevate your feet for 15 minutes, 3-4 times a day.  Wear a supportive maternity bra to help with breast tenderness. General instructions  Take over-the-counter and prescription  medicines only as told by your health care provider. There are medicines that are either safe or unsafe to take during pregnancy.  Take warm sitz baths to soothe any pain or discomfort caused by hemorrhoids. Use hemorrhoid cream or witch hazel if your health care provider approves.  Avoid cat litter boxes and soil used by cats. These carry germs that can cause birth defects in the baby. If you have a cat, ask someone to clean the litter box for you.  To prepare for the arrival of your baby:  Take prenatal classes to understand, practice, and ask questions about the labor and delivery.  Make a trial run to the hospital.  Visit the hospital and tour the maternity area.  Arrange for maternity or paternity leave through employers.  Arrange for family and friends to take care of pets while you are in the hospital.  Purchase a rear-facing car seat and make sure you know how to install it in your car.  Pack your hospital bag.  Prepare the baby's nursery. Make sure to remove all pillows and stuffed animals from the baby's crib to prevent suffocation.  Visit your dentist if you have not gone during your pregnancy. Use a soft toothbrush to brush your teeth and be gentle when you floss.  Keep all prenatal follow-up visits as told by your health care provider. This is important. Contact a health care provider if:  You are unsure if you are in labor or if your water has broken.  You become dizzy.  You have mild pelvic cramps, pelvic pressure, or nagging pain in your abdominal area.  You have lower back pain.  You have persistent nausea, vomiting, or diarrhea.  You have an unusual or bad smelling vaginal discharge.  You have pain when you urinate. Get help right away if:  You have a fever.  You are leaking fluid from your vagina.  You have spotting or bleeding from your vagina.  You have severe abdominal pain or cramping.  You have rapid weight loss or weight gain.  You have  shortness of breath with chest pain.  You notice sudden or extreme swelling of your face, hands, ankles, feet, or legs.  Your baby makes fewer than 10 movements in 2 hours.  You have severe headaches that do not go away with medicine.  You have vision changes. Summary  The third trimester is from week 29 through week 40, months 7 through 9. The third trimester is a time when the unborn baby (fetus)   is growing rapidly.  During the third trimester, your discomfort may increase as you and your baby continue to gain weight. You may have abdominal, leg, and back pain, sleeping problems, and an increased need to urinate.  During the third trimester your breasts will keep growing and they will continue to become tender. A yellow fluid (colostrum) may leak from your breasts. This is the first milk you are producing for your baby.  False labor is a condition in which you feel small, irregular tightenings of the muscles in the womb (contractions) that eventually go away. These are called Braxton Hicks contractions. Contractions may last for hours, days, or even weeks before true labor sets in.  Signs of labor can include: abdominal cramps; regular contractions that start at 10 minutes apart and become stronger and more frequent with time; watery or bloody mucus discharge that comes from the vagina; increased pelvic pressure and dull back pain; and leaking of amniotic fluid. This information is not intended to replace advice given to you by your health care provider. Make sure you discuss any questions you have with your health care provider. Document Released: 01/11/2001 Document Revised: 06/25/2015 Document Reviewed: 03/20/2012 Elsevier Interactive Patient Education  2017 [employer]  

## 2016-01-13 NOTE — Progress Notes (Signed)
   PRENATAL VISIT NOTE  Subjective:  [name] is a 35 y.o. [id number] at 4498w0d being seen today for ongoing prenatal care.  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  Patient reports occasional contractions.  Contractions: Irregular. Vag. Bleeding: None.  Movement: Present. Denies leaking of fluid.   The following portions of the patient's history were reviewed and updated as appropriate: allergies, current medications, past family history, past medical history, past social history, past surgical history and problem list. Problem list updated.  Objective:   Vitals:   01/13/16 0835  BP: 127/79  Pulse: (!) 114  Weight: 295 lb (133.8 kg)    Fetal Status: Fetal Heart Rate (bpm): 145 Fundal Height: 34 cm Movement: Present     General:  Alert, oriented and cooperative. Patient is in no acute distress.  Skin: Skin is warm and dry. No rash noted.   Cardiovascular: Normal heart rate noted  Respiratory: Normal respiratory effort, no problems with respiration noted  Abdomen: Soft, gravid, appropriate for gestational age. Pain/Pressure: Present     Pelvic:  Cervical exam deferred        Extremities: Normal range of motion.  Edema: None  Mental Status: Normal mood and affect. Normal behavior. Normal judgment and thought content.   Assessment and Plan:  Pregnancy: [id number] at 4698w0d  1. [facility] other normal pregnancy[facility]  Preterm labor symptoms and general obstetric precautions including but not limited to vaginal bleeding, contractions, leaking of fluid and fetal movement were reviewed in detail  with the patient. Please refer to After Visit Summary for other counseling recommendations.  Return in about 2 weeks (around 01/27/2016).   Adam PhenixJames G Kaytelynn Scripter, MD

## 2016-01-13 NOTE — Progress Notes (Signed)
Patient is reporting more severe Erika PeltonBraxton Hicks and a lot of pressure in the vagina

## 2016-01-15 ENCOUNTER — Ambulatory Visit (HOSPITAL_COMMUNITY)
Admission: RE | Admit: 2016-01-15 | Discharge: 2016-01-15 | Disposition: A | Discharge status: Home / Self Care | Payer: Medicaid Other | Source: Ambulatory Visit | Attending: Obstetrics & Gynecology | Admitting: Obstetrics & Gynecology

## 2016-01-15 ENCOUNTER — Other Ambulatory Visit (HOSPITAL_COMMUNITY): Payer: Self-pay | Admitting: *Deleted

## 2016-01-15 ENCOUNTER — Other Ambulatory Visit (HOSPITAL_COMMUNITY): Payer: Self-pay | Admitting: Maternal and Fetal Medicine

## 2016-01-15 ENCOUNTER — Encounter (HOSPITAL_COMMUNITY): Payer: Self-pay

## 2016-01-15 DIAGNOSIS — Z3A33 33 weeks gestation of pregnancy: Secondary | ICD-10-CM | POA: Diagnosis not present

## 2016-01-15 DIAGNOSIS — E669 Obesity, unspecified: Secondary | ICD-10-CM | POA: Diagnosis not present

## 2016-01-15 DIAGNOSIS — O34219 Maternal care for unspecified type scar from previous cesarean delivery: Secondary | ICD-10-CM | POA: Diagnosis not present

## 2016-01-15 DIAGNOSIS — O99213 Obesity complicating pregnancy, third trimester: Secondary | ICD-10-CM | POA: Diagnosis not present

## 2016-01-15 DIAGNOSIS — Z3689 Encounter for other specified antenatal screening: Secondary | ICD-10-CM | POA: Diagnosis present

## 2016-01-15 DIAGNOSIS — Z6841 Body Mass Index (BMI) 40.0 and over, adult: Secondary | ICD-10-CM | POA: Diagnosis not present

## 2016-01-15 DIAGNOSIS — O99210 Obesity complicating pregnancy, unspecified trimester: Secondary | ICD-10-CM

## 2016-01-29 ENCOUNTER — Ambulatory Visit (INDEPENDENT_AMBULATORY_CARE_PROVIDER_SITE_OTHER): Payer: Medicaid Other | Admitting: Obstetrics and Gynecology

## 2016-01-29 ENCOUNTER — Encounter: Payer: Self-pay | Admitting: Obstetrics and Gynecology

## 2016-01-29 ENCOUNTER — Other Ambulatory Visit (HOSPITAL_COMMUNITY)
Admission: RE | Admit: 2016-01-29 | Discharge: 2016-01-29 | Disposition: A | Discharge status: Home / Self Care | Payer: Medicaid Other | Source: Ambulatory Visit | Attending: Obstetrics and Gynecology | Admitting: Obstetrics and Gynecology

## 2016-01-29 VITALS — BP 131/81 | HR 105 | Wt 296.3 lb

## 2016-01-29 DIAGNOSIS — O99210 Obesity complicating pregnancy, unspecified trimester: Secondary | ICD-10-CM

## 2016-01-29 DIAGNOSIS — Z6841 Body Mass Index (BMI) 40.0 and over, adult: Secondary | ICD-10-CM

## 2016-01-29 DIAGNOSIS — B373 Candidiasis of vulva and vagina: Secondary | ICD-10-CM

## 2016-01-29 DIAGNOSIS — B9689 Other specified bacterial agents as the cause of diseases classified elsewhere: Secondary | ICD-10-CM

## 2016-01-29 DIAGNOSIS — B3731 Acute candidiasis of vulva and vagina: Secondary | ICD-10-CM

## 2016-01-29 DIAGNOSIS — N760 Acute vaginitis: Secondary | ICD-10-CM

## 2016-01-29 DIAGNOSIS — O34219 Maternal care for unspecified type scar from previous cesarean delivery: Secondary | ICD-10-CM

## 2016-01-29 DIAGNOSIS — Z113 Encounter for screening for infections with a predominantly sexual mode of transmission: Secondary | ICD-10-CM | POA: Diagnosis present

## 2016-01-29 DIAGNOSIS — N898 Other specified noninflammatory disorders of vagina: Secondary | ICD-10-CM

## 2016-01-29 DIAGNOSIS — Z3483 Encounter for supervision of other normal pregnancy, third trimester: Secondary | ICD-10-CM

## 2016-01-29 MED ORDER — MICONAZOLE NITRATE 2 % VA CREA: 1.0000 | TOPICAL_CREAM | Freq: Every day | VAGINAL | 0 refills | Status: DC

## 2016-01-29 MED ORDER — METRONIDAZOLE 500 MG PO TABS: 500.0000 mg | ORAL_TABLET | Freq: Two times a day (BID) | ORAL | 0 refills | Status: DC

## 2016-01-29 NOTE — Progress Notes (Signed)
Prenatal Visit Note Date: 01/29/2016 Clinic: [facility]  Subjective:  [name] is a 35 y.o. [id number] at 770w2d being seen today for ongoing prenatal care.  She is currently monitored for the following issues for this low-risk pregnancy and has Depression with anxiety; Previous cesarean delivery, antepartum; Supervision of normal pregnancy; Obesity in pregnancy, antepartum; Rubella non-immune status, antepartum; and BMI 45.0-49.9, adult (HCC) on her problem list.  Patient reports vaginal irritation, itching and d/c x 1 week. pt has been self tx with apple cider vinegar but no help..   Contractions: Irregular. Vag. Bleeding: None.  Movement: Present. Denies leaking of fluid.   The following portions of the patient's history were reviewed and updated as appropriate: allergies, current medications, past family history, past medical history, past social history, past surgical history and problem list. Problem list updated.  Objective:   Vitals:   01/29/16 1128  BP: 131/81  Pulse: (!) 105  Weight: 296 lb 4.8 oz (134.4 kg)    Fetal Status: Fetal Heart Rate (bpm): 135 Fundal Height: 37 cm Movement: Present  Presentation: Vertex  General:  Alert, oriented and cooperative. Patient is in no acute distress.  Skin: Skin is warm and dry. No rash noted.   Cardiovascular: Normal heart rate noted  Respiratory: Normal respiratory effort, no problems with respiration noted  Abdomen: Soft, gravid, appropriate for gestational age. Pain/Pressure: Present     Pelvic:  EGBUS with erythema and mild excoriations on the right labia majora (this was swabbed with hard tip and cotton tip end). White cottage cheese d/c at introitus and in vault. cx visually closed        Extremities: Normal range of motion.     Mental Status: Normal mood and affect. Normal behavior. Normal judgment and thought content.   Urinalysis:      Assessment and Plan:  Pregnancy: [id number] at 3370w2d  1.  Vulvovaginal candidiasis Monistat 7 and flagyl for BV sent in  2. Vaginal irritation Pt denies any h/o HSV. Swab sent just to be sure.  - Strep Gp B NAA - Cervicovaginal ancillary only - Herpes simplex virus culture  3. Encounter for supervision of other normal pregnancy in third trimester See above  4. Previous cesarean delivery, antepartum Desires TOLAC. Consent already signed.    Preterm labor symptoms and general obstetric precautions including but not limited to vaginal bleeding, contractions, leaking of fluid and fetal movement were reviewed in detail with the patient. Please refer to After Visit Summary for other counseling recommendations.  Return in about 10 days (around 02/08/2016) for 10d.   Brewster Bingharlie Ryn Peine, MD

## 2016-01-30 LAB — OB RESULTS CONSOLE GBS: GBS: NEGATIVE

## 2016-01-31 LAB — STREP GP B NAA: Strep Gp B NAA: NEGATIVE

## 2016-02-01 LAB — HERPES SIMPLEX VIRUS CULTURE

## 2016-02-01 NOTE — L&D Delivery Note (Signed)
36 y.o. [id number] at 6714w2d delivered a viable female infant in cephalic, LOA position. x1 nuchal cord, easily reduced. Anterior shoulder delivered with ease. 60 sec delayed cord clamping. Cord clamped x2 and cut. Placenta delivered spontaneously intact, with 3VC. Fundus firm on exam with massage and pitocin. Good hemostasis noted.  Laceration: none Suture: none Good hemostasis noted. EBL 100cc  Mom and baby recovering in LDR.    Apgars:  8/9 Weight:  3150g    Renne Muscaaniel L Warden, MD PGY-1 02/21/2016, 8:00 PM   The above was performed under my direct supervision and guidance.

## 2016-02-02 LAB — CERVICOVAGINAL ANCILLARY ONLY
Chlamydia: NEGATIVE
NEISSERIA GONORRHEA: NEGATIVE
TRICH (WINDOWPATH): NEGATIVE

## 2016-02-03 ENCOUNTER — Encounter: Payer: Medicaid Other | Admitting: Certified Nurse Midwife

## 2016-02-10 ENCOUNTER — Ambulatory Visit (INDEPENDENT_AMBULATORY_CARE_PROVIDER_SITE_OTHER): Payer: Medicaid Other | Admitting: Obstetrics & Gynecology

## 2016-02-10 VITALS — BP 133/83 | HR 102 | Wt 303.0 lb

## 2016-02-10 DIAGNOSIS — O34219 Maternal care for unspecified type scar from previous cesarean delivery: Secondary | ICD-10-CM

## 2016-02-10 DIAGNOSIS — Z3483 Encounter for supervision of other normal pregnancy, third trimester: Secondary | ICD-10-CM

## 2016-02-10 DIAGNOSIS — R51 Headache: Secondary | ICD-10-CM

## 2016-02-10 DIAGNOSIS — O26893 Other specified pregnancy related conditions, third trimester: Secondary | ICD-10-CM

## 2016-02-10 DIAGNOSIS — R519 Headache, unspecified: Secondary | ICD-10-CM

## 2016-02-10 MED ORDER — BUTALBITAL-APAP-CAFFEINE 50-325-40 MG PO TABS: 1.0000 | ORAL_TABLET | Freq: Four times a day (QID) | ORAL | 2 refills | Status: DC | PRN

## 2016-02-10 NOTE — Progress Notes (Signed)
   PRENATAL VISIT NOTE  Subjective:  [name] is a 36 y.o. [id number] at 3658w0d being seen today for ongoing prenatal care.  She is currently monitored for the following issues for this low-risk pregnancy and has Depression with anxiety; Previous cesarean delivery, antepartum; Supervision of normal pregnancy; Obesity in pregnancy, antepartum; Rubella non-immune status, antepartum; and BMI 45.0-49.9, adult (HCC) on her problem list.  Patient reports occasional contractions and headaches, desires Fioricet refill. No visual symptoms, RUQ/epigastric pain.  Contractions: Irregular. Vag. Bleeding: None.  Movement: Present. Denies leaking of fluid.   The following portions of the patient's history were reviewed and updated as appropriate: allergies, current medications, past family history, past medical history, past social history, past surgical history and problem list. Problem list updated.  Objective:   Vitals:   02/10/16 0817  BP: 133/83  Pulse: (!) 102  Weight: (!) 303 lb (137.4 kg)    Fetal Status: Fetal Heart Rate (bpm): 143 Fundal Height: 39 cm Movement: Present     General:  Alert, oriented and cooperative. Patient is in no acute distress.  Skin: Skin is warm and dry. No rash noted.   Cardiovascular: Normal heart rate noted  Respiratory: Normal respiratory effort, no problems with respiration noted  Abdomen: Soft, gravid, appropriate for gestational age. Pain/Pressure: Present     Pelvic:  Cervical exam deferred        Extremities: Normal range of motion.  Edema: Trace  Mental Status: Normal mood and affect. Normal behavior. Normal judgment and thought content.   Assessment and Plan:  Pregnancy: [id number] at 5358w0d  1. Headache in pregnancy, antepartum, third trimester Fioricet refilled. Normal BP for now, continue close monitoring. - butalbital-acetaminophen-caffeine (FIORICET, ESGIC) 50-325-40 MG tablet; Take 1-2 tablets by mouth every 6 (six) hours as needed for headache.   Dispense: 20 tablet; Refill: 2  2. Previous cesarean delivery, antepartum Desires TOLAC but concerned about fetal size. Already scheduled for growth scan on 02/12/16, will follow up results.  3. Encounter for supervision of other normal pregnancy in third trimester Preterm labor symptoms and general obstetric precautions including but not limited to vaginal bleeding, contractions, leaking of fluid and fetal movement were reviewed in detail with the patient. Please refer to After Visit Summary for other counseling recommendations.  Return in about 1 week (around 02/17/2016) for OB Visit.   Tereso NewcomerUgonna A Heath Badon, MD

## 2016-02-10 NOTE — Patient Instructions (Signed)
Return to clinic for any scheduled appointments or obstetric concerns, or go to [facility] for evaluation  

## 2016-02-12 ENCOUNTER — Ambulatory Visit (HOSPITAL_COMMUNITY)
Admission: RE | Admit: 2016-02-12 | Discharge: 2016-02-12 | Disposition: A | Discharge status: Home / Self Care | Payer: Medicaid Other | Source: Ambulatory Visit | Attending: Obstetrics & Gynecology | Admitting: Obstetrics & Gynecology

## 2016-02-12 ENCOUNTER — Encounter (HOSPITAL_COMMUNITY): Payer: Self-pay

## 2016-02-12 DIAGNOSIS — O34219 Maternal care for unspecified type scar from previous cesarean delivery: Secondary | ICD-10-CM | POA: Insufficient documentation

## 2016-02-12 DIAGNOSIS — Z6841 Body Mass Index (BMI) 40.0 and over, adult: Secondary | ICD-10-CM | POA: Diagnosis not present

## 2016-02-12 DIAGNOSIS — Z3A37 37 weeks gestation of pregnancy: Secondary | ICD-10-CM | POA: Insufficient documentation

## 2016-02-12 DIAGNOSIS — O99213 Obesity complicating pregnancy, third trimester: Secondary | ICD-10-CM | POA: Insufficient documentation

## 2016-02-18 ENCOUNTER — Inpatient Hospital Stay (HOSPITAL_COMMUNITY)
Admission: AD | Admit: 2016-02-18 | Discharge: 2016-02-21 | DRG: 775 | Disposition: A | Discharge status: Home / Self Care | Payer: Medicaid Other | Source: Ambulatory Visit | Attending: Obstetrics & Gynecology | Admitting: Obstetrics & Gynecology

## 2016-02-18 ENCOUNTER — Encounter (HOSPITAL_COMMUNITY): Payer: Self-pay | Admitting: *Deleted

## 2016-02-18 ENCOUNTER — Encounter: Payer: Self-pay | Admitting: Obstetrics

## 2016-02-18 ENCOUNTER — Ambulatory Visit (INDEPENDENT_AMBULATORY_CARE_PROVIDER_SITE_OTHER): Payer: Medicaid Other | Admitting: Obstetrics

## 2016-02-18 VITALS — BP 112/80 | HR 111 | Wt 302.0 lb

## 2016-02-18 DIAGNOSIS — O99214 Obesity complicating childbirth: Secondary | ICD-10-CM | POA: Diagnosis present

## 2016-02-18 DIAGNOSIS — O34211 Maternal care for low transverse scar from previous cesarean delivery: Secondary | ICD-10-CM | POA: Diagnosis present

## 2016-02-18 DIAGNOSIS — Z283 Underimmunization status: Secondary | ICD-10-CM

## 2016-02-18 DIAGNOSIS — O4202 Full-term premature rupture of membranes, onset of labor within 24 hours of rupture: Secondary | ICD-10-CM | POA: Diagnosis present

## 2016-02-18 DIAGNOSIS — Z3A38 38 weeks gestation of pregnancy: Secondary | ICD-10-CM

## 2016-02-18 DIAGNOSIS — Z2839 Other underimmunization status: Secondary | ICD-10-CM

## 2016-02-18 DIAGNOSIS — Z6841 Body Mass Index (BMI) 40.0 and over, adult: Secondary | ICD-10-CM

## 2016-02-18 DIAGNOSIS — Z3483 Encounter for supervision of other normal pregnancy, third trimester: Secondary | ICD-10-CM

## 2016-02-18 DIAGNOSIS — O134 Gestational [pregnancy-induced] hypertension without significant proteinuria, complicating childbirth: Principal | ICD-10-CM | POA: Diagnosis present

## 2016-02-18 DIAGNOSIS — Z3480 Encounter for supervision of other normal pregnancy, unspecified trimester: Secondary | ICD-10-CM

## 2016-02-18 DIAGNOSIS — O1490 Unspecified pre-eclampsia, unspecified trimester: Secondary | ICD-10-CM | POA: Diagnosis present

## 2016-02-18 DIAGNOSIS — R030 Elevated blood-pressure reading, without diagnosis of hypertension: Secondary | ICD-10-CM | POA: Diagnosis present

## 2016-02-18 DIAGNOSIS — Z833 Family history of diabetes mellitus: Secondary | ICD-10-CM

## 2016-02-18 DIAGNOSIS — O9989 Other specified diseases and conditions complicating pregnancy, childbirth and the puerperium: Secondary | ICD-10-CM

## 2016-02-18 DIAGNOSIS — O139 Gestational [pregnancy-induced] hypertension without significant proteinuria, unspecified trimester: Secondary | ICD-10-CM | POA: Diagnosis present

## 2016-02-18 DIAGNOSIS — O34219 Maternal care for unspecified type scar from previous cesarean delivery: Secondary | ICD-10-CM | POA: Diagnosis not present

## 2016-02-18 DIAGNOSIS — O135 Gestational [pregnancy-induced] hypertension without significant proteinuria, complicating the puerperium: Secondary | ICD-10-CM | POA: Diagnosis not present

## 2016-02-18 DIAGNOSIS — O99210 Obesity complicating pregnancy, unspecified trimester: Secondary | ICD-10-CM

## 2016-02-18 DIAGNOSIS — O133 Gestational [pregnancy-induced] hypertension without significant proteinuria, third trimester: Secondary | ICD-10-CM

## 2016-02-18 LAB — TYPE AND SCREEN
ABO/RH(D): B POS
Antibody Screen: NEGATIVE

## 2016-02-18 LAB — COMPREHENSIVE METABOLIC PANEL
ALBUMIN: 2.9 g/dL — AB (ref 3.5–5.0)
ALT: 17 U/L (ref 14–54)
AST: 18 U/L (ref 15–41)
Alkaline Phosphatase: 189 U/L — ABNORMAL HIGH (ref 38–126)
Anion gap: 7 (ref 5–15)
BUN: 11 mg/dL (ref 6–20)
CO2: 21 mmol/L — AB (ref 22–32)
CREATININE: 0.85 mg/dL (ref 0.44–1.00)
Calcium: 9 mg/dL (ref 8.9–10.3)
Chloride: 106 mmol/L (ref 101–111)
GFR calc non Af Amer: >60 mL/min (ref >60)
GLUCOSE: 88 mg/dL (ref 65–99)
Potassium: 3.9 mmol/L (ref 3.5–5.1)
SODIUM: 134 mmol/L — AB (ref 135–145)
Total Bilirubin: 0.2 mg/dL — ABNORMAL LOW (ref 0.3–1.2)
Total Protein: 7.2 g/dL (ref 6.5–8.1)

## 2016-02-18 LAB — URINALYSIS, ROUTINE W REFLEX MICROSCOPIC
Bilirubin Urine: NEGATIVE
GLUCOSE, UA: NEGATIVE mg/dL
Hgb urine dipstick: NEGATIVE
Ketones, ur: NEGATIVE mg/dL
LEUKOCYTES UA: NEGATIVE
Nitrite: NEGATIVE
PH: 6 (ref 5.0–8.0)
Protein, ur: NEGATIVE mg/dL
Specific Gravity, Urine: 1.018 (ref 1.005–1.030)

## 2016-02-18 LAB — CBC WITH DIFFERENTIAL/PLATELET
BASOS ABS: 0 10*3/uL (ref 0.0–0.1)
BASOS PCT: 0 %
Eosinophils Absolute: 0.1 10*3/uL (ref 0.0–0.7)
Eosinophils Relative: 1 %
HCT: 30.2 % — ABNORMAL LOW (ref 36.0–46.0)
HEMOGLOBIN: 10.1 g/dL — AB (ref 12.0–15.0)
LYMPHS PCT: 29 %
Lymphs Abs: 3.2 10*3/uL (ref 0.7–4.0)
MCH: 24 pg — AB (ref 26.0–34.0)
MCHC: 33.4 g/dL (ref 30.0–36.0)
MCV: 71.7 fL — ABNORMAL LOW (ref 78.0–100.0)
MONO ABS: 0.3 10*3/uL (ref 0.1–1.0)
Monocytes Relative: 3 %
NEUTROS PCT: 67 %
Neutro Abs: 7.3 10*3/uL (ref 1.7–7.7)
Other: 0 %
Platelets: 307 10*3/uL (ref 150–400)
RBC: 4.21 MIL/uL (ref 3.87–5.11)
RDW: 14.9 % (ref 11.5–15.5)
WBC: 10.9 10*3/uL — ABNORMAL HIGH (ref 4.0–10.5)

## 2016-02-18 LAB — PROTEIN / CREATININE RATIO, URINE
Creatinine, Urine: 164 mg/dL
Protein Creatinine Ratio: 0.05 mg/mg{Cre} (ref 0.00–0.15)
Total Protein, Urine: 9 mg/dL

## 2016-02-18 MED ORDER — CYCLOBENZAPRINE HCL 10 MG PO TABS
5.0000 mg | ORAL_TABLET | Freq: Once | ORAL | Status: AC
  Administered 2016-02-18: 5 mg via ORAL
  Filled 2016-02-18: qty 1

## 2016-02-18 MED ORDER — TERBUTALINE SULFATE 1 MG/ML IJ SOLN
0.2500 mg | Freq: Once | INTRAMUSCULAR | Status: DC | PRN
  Filled 2016-02-18: qty 1

## 2016-02-18 MED ORDER — LACTATED RINGERS IV SOLN
INTRAVENOUS | Status: DC
  Administered 2016-02-18 – 2016-02-19 (×3): via INTRAVENOUS

## 2016-02-18 MED ORDER — MISOPROSTOL 25 MCG QUARTER TABLET
25.0000 ug | ORAL_TABLET | ORAL | Status: DC | PRN
  Filled 2016-02-18: qty 1

## 2016-02-18 MED ORDER — OXYTOCIN BOLUS FROM INFUSION: 500.0000 mL | Freq: Once | INTRAVENOUS | Status: DC

## 2016-02-18 MED ORDER — HYDRALAZINE HCL 20 MG/ML IJ SOLN: 10.0000 mg | Freq: Once | INTRAMUSCULAR | Status: DC | PRN

## 2016-02-18 MED ORDER — OXYTOCIN 40 UNITS IN LACTATED RINGERS INFUSION - SIMPLE MED
2.5000 [IU]/h | INTRAVENOUS | Status: DC
  Filled 2016-02-18: qty 1000

## 2016-02-18 MED ORDER — BUTALBITAL-APAP-CAFFEINE 50-325-40 MG PO TABS
2.0000 | ORAL_TABLET | Freq: Once | ORAL | Status: AC
  Administered 2016-02-18: 2 via ORAL
  Filled 2016-02-18: qty 2

## 2016-02-18 MED ORDER — LIDOCAINE HCL (PF) 1 % IJ SOLN
30.0000 mL | INTRAMUSCULAR | Status: DC | PRN
  Filled 2016-02-18: qty 30

## 2016-02-18 MED ORDER — ACETAMINOPHEN 325 MG PO TABS
650.0000 mg | ORAL_TABLET | ORAL | Status: DC | PRN
  Administered 2016-02-19: 650 mg via ORAL
  Filled 2016-02-18: qty 2

## 2016-02-18 MED ORDER — LACTATED RINGERS IV SOLN: 500.0000 mL | INTRAVENOUS | Status: DC | PRN

## 2016-02-18 MED ORDER — LABETALOL HCL 5 MG/ML IV SOLN: 20.0000 mg | INTRAVENOUS | Status: DC | PRN

## 2016-02-18 MED ORDER — FLEET ENEMA 7-19 GM/118ML RE ENEM: 1.0000 | ENEMA | RECTAL | Status: DC | PRN

## 2016-02-18 MED ORDER — OXYCODONE-ACETAMINOPHEN 5-325 MG PO TABS
1.0000 | ORAL_TABLET | Freq: Once | ORAL | Status: AC
  Administered 2016-02-18: 1 via ORAL
  Filled 2016-02-18: qty 1

## 2016-02-18 MED ORDER — SOD CITRATE-CITRIC ACID 500-334 MG/5ML PO SOLN: 30.0000 mL | ORAL | Status: DC | PRN

## 2016-02-18 MED ORDER — MAGNESIUM SULFATE 50 % IJ SOLN
2.0000 g/h | INTRAVENOUS | Status: DC
  Filled 2016-02-18: qty 80

## 2016-02-18 MED ORDER — ONDANSETRON HCL 4 MG/2ML IJ SOLN: 4.0000 mg | Freq: Four times a day (QID) | INTRAMUSCULAR | Status: DC | PRN

## 2016-02-18 MED ORDER — OXYTOCIN 40 UNITS IN LACTATED RINGERS INFUSION - SIMPLE MED
1.0000 m[IU]/min | INTRAVENOUS | Status: DC
  Administered 2016-02-18: 2 m[IU]/min via INTRAVENOUS

## 2016-02-18 MED ORDER — MAGNESIUM SULFATE BOLUS VIA INFUSION
4.0000 g | Freq: Once | INTRAVENOUS | Status: DC
  Filled 2016-02-18: qty 500

## 2016-02-18 NOTE — MAU Provider Note (Signed)
History     CSN: [id number]  Arrival date and time: 02/18/16 1319   First Provider Initiated Contact with Patient 02/18/16 1350      Chief Complaint  Patient presents with  . Hypertension   HPI [name] is a 36 y.o. [id number] at 5180w1d who presents to MAU today from the office for further evaluation headache and possible elevated blood pressure. Per patient she has been having headaches off and on and was taking Fioricet, which was helping until recently. She also states that she has had floaters. She has not taken anything for the headache since last night. She denies RUQ abdominal pain, blurred vision or edema. She denies vaginal bleeding, abnormal discharge, LOF or contractions. She is having lower abdominal and low back pain. She reports normal fetal movement.    OB History    Gravida Para Term Preterm AB Living   7 3 3  0 3 2   SAB TAB Ectopic Multiple Live Births   2 1 0 0 3      Past Medical History:  Diagnosis Date  . Depression with anxiety 07/04/2013  . Heart murmur   . Morbid obesity (HCC) 03/21/2013  . 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 BREAST SURGERY  2005   L breast infection after loss  . CESAREAN SECTION  02/24/2011   Procedure: CESAREAN SECTION;  Surgeon: Brock Badharles A Harper, MD;  Location: [facility];  Service: Gynecology;  Laterality: N/A;  Primary, cord ph 7.29    Family History  Problem Relation Age of Onset  . Diabetes Maternal Aunt   . Breast cancer Maternal Aunt     2 maternal aunts with breast cancer diagnosed at 1830 and 34 yo  . Cancer Maternal Grandfather     lung  . Colon cancer Maternal Grandfather     died in his 1060's.     Social History  Substance Use Topics  . Smoking status: Never Smoker  . Smokeless tobacco: Never Used  . Alcohol use Yes     Comment: occasional, social drinking    Allergies:  Allergies  Allergen Reactions  . Codeine Nausea  And Vomiting    Prescriptions Prior to Admission  Medication Sig Dispense Refill Last Dose  . butalbital-acetaminophen-caffeine (FIORICET, ESGIC) 50-325-40 MG tablet Take 1-2 tablets by mouth every 6 (six) hours as needed for headache. 20 tablet 2 02/17/2016 at Unknown time  . Prenatal Vit-Fe Fumarate-FA (PRENATAL MULTIVITAMIN) TABS tablet Take 1 tablet by mouth daily at 12 noon.    02/18/2016 at Unknown time  . metroNIDAZOLE (FLAGYL) 500 MG tablet Take 1 tablet (500 mg total) by mouth 2 (two) times daily. (Patient not taking: Reported on 02/12/2016) 14 tablet 0 Not Taking  . miconazole (MONISTAT 7) 2 % vaginal cream Place 1 Applicatorful vaginally at bedtime. (Patient not taking: Reported on 02/12/2016) 45 g 0 Not Taking    Review of Systems  Constitutional: Negative for fever.  Eyes: Positive for visual disturbance.  Cardiovascular: Negative for leg swelling.  Gastrointestinal: Positive for abdominal pain. Negative for constipation, diarrhea, nausea and vomiting.  Musculoskeletal: Positive for back pain.  Neurological: Positive for dizziness, light-headedness and headaches.   Physical Exam   Blood pressure 123/78, pulse 100, temperature 99 F (37.2 C), resp. rate 20, last menstrual period 05/27/2015.  Physical Exam  Nursing note and vitals reviewed. Constitutional: She is oriented to person, place,  and time. She appears well-developed and well-nourished. No distress.  HENT:  Head: Normocephalic and atraumatic.  Cardiovascular: Normal rate.   Respiratory: Effort normal.  GI: Soft. She exhibits no distension and no mass. There is no tenderness. There is no rebound and no guarding.  Musculoskeletal: She exhibits no edema.  Neurological: She is alert and oriented to person, place, and time. She has normal reflexes.  No clonus  Skin: Skin is warm and dry. No erythema.  Psychiatric: She has a normal mood and affect.    Results for orders placed or performed during the hospital  encounter of 02/18/16 (from the past 24 hour(s))  CBC with Differential/Platelet     Status: Abnormal   Collection Time: 02/18/16  2:04 PM  Result Value Ref Range   WBC 10.9 (H) 4.0 - 10.5 K/uL   RBC 4.21 3.87 - 5.11 MIL/uL   Hemoglobin 10.1 (L) 12.0 - 15.0 g/dL   HCT 16.1 (L) 09.6 - 04.5 %   MCV 71.7 (L) 78.0 - 100.0 fL   MCH 24.0 (L) 26.0 - 34.0 pg   MCHC 33.4 30.0 - 36.0 g/dL   RDW 40.9 81.1 - 91.4 %   Platelets 307 150 - 400 K/uL   Neutrophils Relative % 67 %   Lymphocytes Relative 29 %   Monocytes Relative 3 %   Eosinophils Relative 1 %   Basophils Relative 0 %   Other 0 %   Neutro Abs 7.3 1.7 - 7.7 K/uL   Lymphs Abs 3.2 0.7 - 4.0 K/uL   Monocytes Absolute 0.3 0.1 - 1.0 K/uL   Eosinophils Absolute 0.1 0.0 - 0.7 K/uL   Basophils Absolute 0.0 0.0 - 0.1 K/uL  Comprehensive metabolic panel     Status: Abnormal   Collection Time: 02/18/16  2:04 PM  Result Value Ref Range   Sodium 134 (L) 135 - 145 mmol/L   Potassium 3.9 3.5 - 5.1 mmol/L   Chloride 106 101 - 111 mmol/L   CO2 21 (L) 22 - 32 mmol/L   Glucose, Bld 88 65 - 99 mg/dL   BUN 11 6 - 20 mg/dL   Creatinine, Ser 7.82 0.44 - 1.00 mg/dL   Calcium 9.0 8.9 - 95.6 mg/dL   Total Protein 7.2 6.5 - 8.1 g/dL   Albumin 2.9 (L) 3.5 - 5.0 g/dL   AST 18 15 - 41 U/L   ALT 17 14 - 54 U/L   Alkaline Phosphatase 189 (H) 38 - 126 U/L   Total Bilirubin 0.2 (L) 0.3 - 1.2 mg/dL   GFR calc non Af Amer >60 >60 mL/min   GFR calc Af Amer >60 >60 mL/min   Anion gap 7 5 - 15  Protein / creatinine ratio, urine     Status: None   Collection Time: 02/18/16  2:15 PM  Result Value Ref Range   Creatinine, Urine 164.00 mg/dL   Total Protein, Urine 9 mg/dL   Protein Creatinine Ratio 0.05 0.00 - 0.15 mg/mg[Cre]  Urinalysis, Routine w reflex microscopic     Status: Abnormal   Collection Time: 02/18/16  2:15 PM  Result Value Ref Range   Color, Urine YELLOW YELLOW   APPearance HAZY (A) CLEAR   Specific Gravity, Urine 1.018 1.005 - 1.030   pH 6.0  5.0 - 8.0   Glucose, UA NEGATIVE NEGATIVE mg/dL   Hgb urine dipstick NEGATIVE NEGATIVE   Bilirubin Urine NEGATIVE NEGATIVE   Ketones, ur NEGATIVE NEGATIVE mg/dL   Protein, ur NEGATIVE NEGATIVE mg/dL  Nitrite NEGATIVE NEGATIVE   Leukocytes, UA NEGATIVE NEGATIVE    Fetal Monitoring: Baseline: 130 bpm Variability: moderate Accelerations: 15 x 15 Decelerations: none Contractions: few, irregular with mild UI  MAU Course  Procedures None  MDM CBC, CMP, Urinalysis and Urine Protein/Creatinine ratio today Fioricet given for headache  Serial BPs while in MAU. Only one elevated blood pressure noted during MAU stay Patient continues to report headache without improvement with Fioricet, although she was asleep in the room when CNA entered for re-evaluation.  Percocet given for headache Patient reports no improvement in HA Called [facility] and confirmed first BP in the office was 160s/90s.  Discussed patient with Dr. Debroah Loop. Admit for induction for GHTN  Assessment and Plan  A: SIUP at [redacted]w[redacted]d GHTN Headache   P: Admit for induction  Marny Lowenstein, PA-C  02/18/2016, 4:03 PM

## 2016-02-18 NOTE — MAU Note (Signed)
Pt sent over from appointment for elevated bp. Pt c/o headache and seeing spots. Feels like she is going to faint. Also c/o ctx about q 7 min,

## 2016-02-18 NOTE — Progress Notes (Signed)
Cx completely closed, unable to pass foley.  FHR Cat 1.  Will do low dose pitocin until cx ripens up, then try again (unless pt goes into labor).   Vitals:   02/18/16 2008 02/18/16 2149  BP: 134/90 127/70  Pulse: (!) 104 94  Resp:    Temp:

## 2016-02-18 NOTE — Progress Notes (Signed)
Subjective:    [name] is a 36 y.o. female being seen today for her obstetrical visit. She is at 710w1d gestation. Patient reports headache and seeing spots with dizziness. Fetal movement: positive  Problem List Items Addressed This Visit    None     Patient Active Problem List   Diagnosis Date Noted  . BMI 45.0-49.9, adult (HCC) 01/29/2016  . Rubella non-immune status, antepartum 08/18/2015  . Previous cesarean delivery, antepartum 08/17/2015  . Supervision of normal pregnancy 08/17/2015  . Obesity in pregnancy, antepartum 08/17/2015  . Depression with anxiety 07/04/2013    Objective:    BP 112/80   Pulse (!) 111   Wt (!) 302 lb (137 kg)   LMP 05/27/2015 (Exact Date)   BMI 48.74 kg/m  FHT: 142 BPM  Uterine Size: size greater than dates    Assessment:    Pregnancy @ 7710w1d weeks    Headache, visual changes and dizziness.  Urine with trace protein.  Plan:   Plans for delivery: Vaginal anticipated; labs reviewed; problem list updated Counseling: Consent signed. Infant feeding: plans to breastfeed. Cigarette smoking: never smoked. L&D discussion: symptoms of labor, discussed when to call, discussed what number to call, anesthetic/analgesic options reviewed and delivering clinician:  plans no preference. Postpartum supports and preparation: circumcision discussed and contraception plans discussed. [facility].  R/O Preeclampsia.  Follow up in 1 Week.  Patient ID: [name], female   DOB: 11-23-1980, 36 y.o.   MRN: [id number]

## 2016-02-18 NOTE — Anesthesia Pain Management Evaluation Note (Signed)
  CRNA Pain Management Visit Note  Patient: [name], 36 y.o., female  "Hello I am a member of the anesthesia team at [facility]. We have an anesthesia team available at all times to provide care throughout the hospital, including epidural management and anesthesia for C-section. I don't know your plan for the delivery whether it a natural birth, water birth, IV sedation, nitrous supplementation, doula or epidural, but we want to meet your pain goals."   1.Was your pain managed to your expectations on prior hospitalizations?   Yes   2.What is your expectation for pain management during this hospitalization?     Epidural  3.How can we help you reach that goal? unsure  Record the patient's initial score and the patient's pain goal.   Pain: 3  Pain Goal: 10 [facility] wants you to be able to say your pain was always managed very well.  Cephus ShellingBURGER,[name] 02/18/2016

## 2016-02-18 NOTE — Progress Notes (Signed)
Patient reports that last night she started seeing spots and has had a headache ever since. Patient states that she feels like her BP has been high, reports nausea and a decrease in fetal movement.

## 2016-02-18 NOTE — H&P (Signed)
LABOR AND DELIVERY ADMISSION HISTORY AND PHYSICAL NOTE  Rosilyn Mingsina M Morton is a 36 y.o. female [phone] with IUP at 5248w1d by LMP consistent with 6 week ultrasound presenting as a direct admission [facility] with a headache and elevated blood pressures. The patient's blood pressure was elevated to 160s/90s in clinic. Pressure was still elevated to the 140s/80s upon arrival. Headache is described as a pressure involving her entire head. She has been having headaches intermittently at home for which fioricet has improved previously. Over the past week she reports the headaches have significantly increased in intensity. She has had some nausea and vomiting x 1. She has been seeing floaters occasionally. No shortness of breath, right upper quadrant abdominal pain. She has had worsening bilateral upper and lower extremity swelling as well.   She reports positive fetal movement. She denies leakage of fluid or vaginal bleeding. No regular contractions  Prenatal History/Complications:  Past Medical History: Past Medical History:  Diagnosis Date  . Depression with anxiety 07/04/2013  . Heart murmur   . Morbid obesity (HCC) 03/21/2013  . Postpartum depression    After fetal loss in 2005 due to anomalies    Past Surgical History: Past Surgical History:  Procedure Laterality Date  . BREAST SURGERY  2005   L breast infection after loss  . CESAREAN SECTION  02/24/2011   Procedure: CESAREAN SECTION;  Surgeon: Brock Badharles A Harper, MD;  Location: [facility];  Service: Gynecology;  Laterality: N/A;  Primary, cord ph 7.29    Obstetrical History: OB History    Gravida Para Term Preterm AB Living   7 3 3  0 3 2   SAB TAB Ectopic Multiple Live Births   2 1 0 0 3      Social History: Social History   Social History  . Marital status: Single    Spouse name: N/A  . Number of children: N/A  . Years of education: N/A   Social History Main Topics  . Smoking status: Never Smoker  . Smokeless  tobacco: Never Used  . Alcohol use Yes     Comment: occasional, social drinking  . Drug use: No  . Sexual activity: Yes    Birth control/ protection: None   Other Topics Concern  . None   Social History Narrative  . None    Family History: Family History  Problem Relation Age of Onset  . Diabetes Maternal Aunt   . Breast cancer Maternal Aunt     2 maternal aunts with breast cancer diagnosed at 3330 and 34 yo  . Cancer Maternal Grandfather     lung  . Colon cancer Maternal Grandfather     died in his 7360's.     Allergies: Allergies  Allergen Reactions  . Codeine Nausea And Vomiting    Prescriptions Prior to Admission  Medication Sig Dispense Refill Last Dose  . butalbital-acetaminophen-caffeine (FIORICET, ESGIC) 50-325-40 MG tablet Take 1-2 tablets by mouth every 6 (six) hours as needed for headache. 20 tablet 2 02/17/2016 at Unknown time  . Prenatal Vit-Fe Fumarate-FA (PRENATAL MULTIVITAMIN) TABS tablet Take 1 tablet by mouth daily at 12 noon.    02/18/2016 at Unknown time  . metroNIDAZOLE (FLAGYL) 500 MG tablet Take 1 tablet (500 mg total) by mouth 2 (two) times daily. (Patient not taking: Reported on 02/12/2016) 14 tablet 0 Not Taking  . miconazole (MONISTAT 7) 2 % vaginal cream Place 1 Applicatorful vaginally at bedtime. (Patient not taking: Reported on 02/12/2016) 45 g  0 Not Taking     Review of Systems   All systems reviewed and negative except as stated in HPI  Blood pressure (!) 145/87, pulse 96, temperature 99 F (37.2 C), resp. rate 20, height 5' 6.5" (1.689 m), weight (!) 302 lb (137 kg), last menstrual period 05/27/2015. General appearance: alert, cooperative and no distress Lungs: clear to auscultation bilaterally Heart: regular rate and rhythm Abdomen: soft, non-tender; bowel sounds normal Extremities: No calf swelling or tenderness Presentation: cephalic, vertex  Fetal monitoring: FHT with baseline in the 130s, +accels, -decels, mod variability Uterine  activity: no regular contractions Dilation: Closed Effacement (%): Thick Station: -3 Exam by:: Dr Omer Jack   Prenatal labs: ABO, Rh: --/--/B POS (01/18 1404) Antibody: NEG (01/18 1404) Rubella: Non-immune RPR: Non Reactive (10/31 1145)  HBsAg: Negative (07/17 1604)  HIV: Non Reactive (10/31 1145)  GBS: Negative (12/30 0000)  2 hr GTT: negative at 15 w and within the third trimester Genetic screening: normal Anatomy US: Normal  Prenatal Transfer Tool  Maternal Diabetes: No Genetic Screening: Normal Maternal Ultrasounds/Referrals: Normal Fetal Ultrasounds or other Referrals:  None Maternal Substance Abuse:  No; Tobacco use prior to pregnancy Significant Maternal Medications:  None Significant Maternal Lab Results: Lab values include: Group B Strep negative  Results for orders placed or performed during the hospital encounter of 02/18/16 (from the past 24 hour(s))  CBC with Differential/Platelet   Collection Time: 02/18/16  2:04 PM  Result Value Ref Range   WBC 10.9 (H) 4.0 - 10.5 K/uL   RBC 4.21 3.87 - 5.11 MIL/uL   Hemoglobin 10.1 (L) 12.0 - 15.0 g/dL   HCT 16.1 (L) 09.6 - 04.5 %   MCV 71.7 (L) 78.0 - 100.0 fL   MCH 24.0 (L) 26.0 - 34.0 pg   MCHC 33.4 30.0 - 36.0 g/dL   RDW 40.9 81.1 - 91.4 %   Platelets 307 150 - 400 K/uL   Neutrophils Relative % 67 %   Lymphocytes Relative 29 %   Monocytes Relative 3 %   Eosinophils Relative 1 %   Basophils Relative 0 %   Other 0 %   Neutro Abs 7.3 1.7 - 7.7 K/uL   Lymphs Abs 3.2 0.7 - 4.0 K/uL   Monocytes Absolute 0.3 0.1 - 1.0 K/uL   Eosinophils Absolute 0.1 0.0 - 0.7 K/uL   Basophils Absolute 0.0 0.0 - 0.1 K/uL  Comprehensive metabolic panel   Collection Time: 02/18/16  2:04 PM  Result Value Ref Range   Sodium 134 (L) 135 - 145 mmol/L   Potassium 3.9 3.5 - 5.1 mmol/L   Chloride 106 101 - 111 mmol/L   CO2 21 (L) 22 - 32 mmol/L   Glucose, Bld 88 65 - 99 mg/dL   BUN 11 6 - 20 mg/dL   Creatinine, Ser 7.82 0.44 - 1.00 mg/dL    Calcium 9.0 8.9 - 95.6 mg/dL   Total Protein 7.2 6.5 - 8.1 g/dL   Albumin 2.9 (L) 3.5 - 5.0 g/dL   AST 18 15 - 41 U/L   ALT 17 14 - 54 U/L   Alkaline Phosphatase 189 (H) 38 - 126 U/L   Total Bilirubin 0.2 (L) 0.3 - 1.2 mg/dL   GFR calc non Af Amer >60 >60 mL/min   GFR calc Af Amer >60 >60 mL/min   Anion gap 7 5 - 15  Type and screen [facility]   Collection Time: 02/18/16  2:04 PM  Result Value Ref Range  ABO/RH(D) B POS    Antibody Screen NEG    Sample Expiration 02/21/2016   Protein / creatinine ratio, urine   Collection Time: 02/18/16  2:15 PM  Result Value Ref Range   Creatinine, Urine 164.00 mg/dL   Total Protein, Urine 9 mg/dL   Protein Creatinine Ratio 0.05 0.00 - 0.15 mg/mg[Cre]  Urinalysis, Routine w reflex microscopic   Collection Time: 02/18/16  2:15 PM  Result Value Ref Range   Color, Urine YELLOW YELLOW   APPearance HAZY (A) CLEAR   Specific Gravity, Urine 1.018 1.005 - 1.030   pH 6.0 5.0 - 8.0   Glucose, UA NEGATIVE NEGATIVE mg/dL   Hgb urine dipstick NEGATIVE NEGATIVE   Bilirubin Urine NEGATIVE NEGATIVE   Ketones, ur NEGATIVE NEGATIVE mg/dL   Protein, ur NEGATIVE NEGATIVE mg/dL   Nitrite NEGATIVE NEGATIVE   Leukocytes, UA NEGATIVE NEGATIVE    Patient Active Problem List   Diagnosis Date Noted  . Gestational hypertension 02/18/2016  . BMI 45.0-49.9, adult (HCC) 01/29/2016  . Rubella non-immune status, antepartum 08/18/2015  . Previous cesarean delivery, antepartum 08/17/2015  . Supervision of normal pregnancy 08/17/2015  . Obesity in pregnancy, antepartum 08/17/2015  . Depression with anxiety 07/04/2013    Assessment: MARYAM FEELY is a 36 y.o. [id number] at [redacted]w[redacted]d here for elevated blood pressures and headache concerning for gestational hypertension vs pre-eclampsia.   # Headache: concern for pre-eclampsia vs gestational hypertension as the cause. Patient also has not eaten. Will administer flexeril x 1. Allowing patient to eat, and  will reasses afterwards. If headaches persists, we will start magnesium.   #. Elevated blood pressure: P:C of 0.05. Continue to monitor. Low threshold to start magnesium however given concern for some severe features of pre-eclampsia.   #labor: Admitted for induction of labor secondary to elevated blood pressure with symptoms of pre-eclampsia with severe features. This will be a TOLAC. Plan to attempt foley bulb placement and use pitocin when appropriate.   #Pain: Patient would like to try and use IV pain medications as well as oral pain medications. Will consider epidural if pain escalates, but would like to hold off at this time.   #FWB: Cat I as noted above  #ID:  GBS negative. No antibiotics warranted.   #MOF: Breast  #MOC:OCPs  #Circ:  Yes, outpatient  Lise Auer, MD PGY-2 02/18/2016, 5:23 PM   OB FELLOW HISTORY AND PHYSICAL ATTESTATION  I have seen and examined this patient; I agree with above documentation in the resident's note. Reassessed the patient after eating, RN had not given flexeril yet, but patient had received Fioricet and Percocet in MAU. Headache currently is resolved, 0/10, patient is not complaining of blurred or change in vision, HA, epigastric/RUQ pain. BPs are normal. Will hold off on magnesium, however if headache returns or other signs/symptoms of preE return, will treat for severe preE and start magnesium, for now is gestational hypertension.   Jen Mow, DO OB Fellow 02/18/2016, 6:34 PM

## 2016-02-18 NOTE — Progress Notes (Signed)
Erika BeamsFran [name], CNM bedise:  Attempts made to place foley bulb were unsuccessful.  POC discussed with patient to continue pitocin at this time not to exceed 356milliunits/min.

## 2016-02-19 ENCOUNTER — Encounter (HOSPITAL_COMMUNITY): Payer: Self-pay

## 2016-02-19 ENCOUNTER — Inpatient Hospital Stay (HOSPITAL_COMMUNITY): Payer: Medicaid Other | Admitting: Anesthesiology

## 2016-02-19 DIAGNOSIS — Z3A38 38 weeks gestation of pregnancy: Secondary | ICD-10-CM

## 2016-02-19 DIAGNOSIS — O135 Gestational [pregnancy-induced] hypertension without significant proteinuria, complicating the puerperium: Secondary | ICD-10-CM

## 2016-02-19 LAB — CBC
HEMATOCRIT: 30.7 % — AB (ref 36.0–46.0)
HEMOGLOBIN: 10.2 g/dL — AB (ref 12.0–15.0)
MCH: 23.8 pg — ABNORMAL LOW (ref 26.0–34.0)
MCHC: 33.2 g/dL (ref 30.0–36.0)
MCV: 71.6 fL — ABNORMAL LOW (ref 78.0–100.0)
Platelets: 314 10*3/uL (ref 150–400)
RBC: 4.29 MIL/uL (ref 3.87–5.11)
RDW: 14.9 % (ref 11.5–15.5)
WBC: 13.6 10*3/uL — ABNORMAL HIGH (ref 4.0–10.5)

## 2016-02-19 LAB — RPR: RPR Ser Ql: NONREACTIVE

## 2016-02-19 MED ORDER — WITCH HAZEL-GLYCERIN EX PADS: 1.0000 "application " | MEDICATED_PAD | CUTANEOUS | Status: DC | PRN

## 2016-02-19 MED ORDER — SENNOSIDES-DOCUSATE SODIUM 8.6-50 MG PO TABS
2.0000 | ORAL_TABLET | ORAL | Status: DC
  Administered 2016-02-19 – 2016-02-21 (×2): 2 via ORAL
  Filled 2016-02-19 (×2): qty 2

## 2016-02-19 MED ORDER — FENTANYL 2.5 MCG/ML BUPIVACAINE 1/10 % EPIDURAL INFUSION (WH - ANES)
INTRAMUSCULAR | Status: AC
  Filled 2016-02-19: qty 100

## 2016-02-19 MED ORDER — ZOLPIDEM TARTRATE 5 MG PO TABS: 5.0000 mg | ORAL_TABLET | Freq: Every evening | ORAL | Status: DC | PRN

## 2016-02-19 MED ORDER — OXYCODONE-ACETAMINOPHEN 5-325 MG PO TABS
1.0000 | ORAL_TABLET | ORAL | Status: DC | PRN
  Administered 2016-02-19 – 2016-02-21 (×4): 1 via ORAL
  Filled 2016-02-19 (×4): qty 1

## 2016-02-19 MED ORDER — BENZOCAINE-MENTHOL 20-0.5 % EX AERO
1.0000 "application " | INHALATION_SPRAY | CUTANEOUS | Status: DC | PRN
  Filled 2016-02-19: qty 56

## 2016-02-19 MED ORDER — PHENYLEPHRINE 40 MCG/ML (10ML) SYRINGE FOR IV PUSH (FOR BLOOD PRESSURE SUPPORT)
80.0000 ug | PREFILLED_SYRINGE | INTRAVENOUS | Status: DC | PRN
  Filled 2016-02-19: qty 5

## 2016-02-19 MED ORDER — ONDANSETRON HCL 4 MG PO TABS: 4.0000 mg | ORAL_TABLET | ORAL | Status: DC | PRN

## 2016-02-19 MED ORDER — IBUPROFEN 600 MG PO TABS
600.0000 mg | ORAL_TABLET | Freq: Four times a day (QID) | ORAL | Status: DC
  Administered 2016-02-19 – 2016-02-21 (×9): 600 mg via ORAL
  Filled 2016-02-19 (×10): qty 1

## 2016-02-19 MED ORDER — SODIUM CHLORIDE 0.9 % IV SOLN
2.0000 g | Freq: Four times a day (QID) | INTRAVENOUS | Status: DC
  Administered 2016-02-19: 2 g via INTRAVENOUS
  Filled 2016-02-19 (×2): qty 2000

## 2016-02-19 MED ORDER — ACETAMINOPHEN 325 MG PO TABS
650.0000 mg | ORAL_TABLET | ORAL | Status: DC | PRN
  Administered 2016-02-20: 650 mg via ORAL
  Filled 2016-02-19: qty 2

## 2016-02-19 MED ORDER — FENTANYL 2.5 MCG/ML BUPIVACAINE 1/10 % EPIDURAL INFUSION (WH - ANES)
14.0000 mL/h | INTRAMUSCULAR | Status: DC | PRN
  Administered 2016-02-19: 14 mL/h via EPIDURAL

## 2016-02-19 MED ORDER — DIPHENHYDRAMINE HCL 25 MG PO CAPS: 25.0000 mg | ORAL_CAPSULE | Freq: Four times a day (QID) | ORAL | Status: DC | PRN

## 2016-02-19 MED ORDER — TETANUS-DIPHTH-ACELL PERTUSSIS 5-2.5-18.5 LF-MCG/0.5 IM SUSP: 0.5000 mL | Freq: Once | INTRAMUSCULAR | Status: DC

## 2016-02-19 MED ORDER — DEXTROSE 5 % IV SOLN
200.0000 mg | Freq: Three times a day (TID) | INTRAVENOUS | Status: DC
  Filled 2016-02-19: qty 5

## 2016-02-19 MED ORDER — LIDOCAINE HCL (PF) 1 % IJ SOLN
INTRAMUSCULAR | Status: DC | PRN
  Administered 2016-02-19: 6 mL via EPIDURAL
  Administered 2016-02-19: 4 mL

## 2016-02-19 MED ORDER — DIPHENHYDRAMINE HCL 50 MG/ML IJ SOLN: 12.5000 mg | INTRAMUSCULAR | Status: DC | PRN

## 2016-02-19 MED ORDER — EPHEDRINE 5 MG/ML INJ
10.0000 mg | INTRAVENOUS | Status: DC | PRN
  Filled 2016-02-19: qty 4

## 2016-02-19 MED ORDER — PHENYLEPHRINE 40 MCG/ML (10ML) SYRINGE FOR IV PUSH (FOR BLOOD PRESSURE SUPPORT)
PREFILLED_SYRINGE | INTRAVENOUS | Status: AC
  Filled 2016-02-19: qty 20

## 2016-02-19 MED ORDER — FENTANYL CITRATE (PF) 100 MCG/2ML IJ SOLN
100.0000 ug | INTRAMUSCULAR | Status: DC | PRN
  Administered 2016-02-19: 100 ug via INTRAVENOUS
  Filled 2016-02-19: qty 2

## 2016-02-19 MED ORDER — OXYCODONE-ACETAMINOPHEN 5-325 MG PO TABS: 2.0000 | ORAL_TABLET | ORAL | Status: DC | PRN

## 2016-02-19 MED ORDER — LACTATED RINGERS IV SOLN
500.0000 mL | Freq: Once | INTRAVENOUS | Status: AC
  Administered 2016-02-19: 500 mL via INTRAVENOUS

## 2016-02-19 MED ORDER — MEASLES, MUMPS & RUBELLA VAC ~~LOC~~ INJ
0.5000 mL | INJECTION | Freq: Once | SUBCUTANEOUS | Status: DC
  Filled 2016-02-19: qty 0.5

## 2016-02-19 MED ORDER — DIBUCAINE 1 % RE OINT: 1.0000 "application " | TOPICAL_OINTMENT | RECTAL | Status: DC | PRN

## 2016-02-19 MED ORDER — ONDANSETRON HCL 4 MG/2ML IJ SOLN: 4.0000 mg | INTRAMUSCULAR | Status: DC | PRN

## 2016-02-19 MED ORDER — SIMETHICONE 80 MG PO CHEW: 80.0000 mg | CHEWABLE_TABLET | ORAL | Status: DC | PRN

## 2016-02-19 MED ORDER — GENTAMICIN SULFATE 40 MG/ML IJ SOLN
220.0000 mg | Freq: Once | INTRAVENOUS | Status: AC
  Administered 2016-02-19: 220 mg via INTRAVENOUS
  Filled 2016-02-19: qty 5.5

## 2016-02-19 MED ORDER — PRENATAL MULTIVITAMIN CH
1.0000 | ORAL_TABLET | Freq: Every day | ORAL | Status: DC
  Administered 2016-02-19 – 2016-02-20 (×2): 1 via ORAL
  Filled 2016-02-19: qty 1

## 2016-02-19 MED ORDER — COCONUT OIL OIL: 1.0000 "application " | TOPICAL_OIL | Status: DC | PRN

## 2016-02-19 NOTE — Anesthesia Postprocedure Evaluation (Signed)
Anesthesia Post Note  Patient: [name]  Procedure(s) Performed: * No procedures listed *  Patient location during evaluation: Mother Baby Anesthesia Type: Epidural Level of consciousness: awake and alert Pain management: satisfactory to patient Vital Signs Assessment: post-procedure vital signs reviewed and stable Respiratory status: respiratory function stable Cardiovascular status: stable Postop Assessment: no headache, no backache, epidural receding, patient able to bend at knees, no signs of nausea or vomiting and adequate PO intake Anesthetic complications: no        Last Vitals:  Vitals:   02/19/16 0930 02/19/16 1030  BP: 118/68 120/68  Pulse: 85 74  Resp: 18 18  Temp: 36.8 C 36.6 C    Last Pain:  Vitals:   02/19/16 1052  TempSrc:   PainSc: 3    Pain Goal: Patients Stated Pain Goal: 2 (02/19/16 1052)               Geralyn Figiel

## 2016-02-19 NOTE — Progress Notes (Signed)
Patient states she can take percocets.

## 2016-02-19 NOTE — Progress Notes (Signed)
.   ANTIBIOTIC CONSULT NOTE - INITIAL  Pharmacy Consult for Gentamicin Indication: triple I   Allergies  Allergen Reactions  . Codeine Nausea And Vomiting    Patient Measurements: Height: 5' 6.5" (168.9 cm) Weight: (!) 302 lb (137 kg) IBW/kg (Calculated) : 60.45 Adjusted Body Weight: 83.4 kg  Vital Signs: Temp: 101.5 F (38.6 C) (01/19 0523) Temp Source: Oral (01/19 0523) BP: 135/83 (01/19 0531) Pulse Rate: 100 (01/19 0531)  Labs:  Recent Labs  02/18/16 1404 02/18/16 1415 02/19/16 0220  WBC 10.9*  --  13.6*  HGB 10.1*  --  10.2*  PLT 307  --  314  LABCREA  --  164.00  --   CREATININE 0.85  --   --    No results for input(s): GENTTROUGH, GENTPEAK, GENTRANDOM in the last 72 hours.   Microbiology: Recent Results (from the past 720 hour(s))  Strep Gp B NAA     Status: None   Collection Time: 01/29/16 11:50 AM  Result Value Ref Range Status   Strep Gp B NAA Negative Negative Final    Comment: Centers for Disease Control and Prevention (CDC) and American Congress of Obstetricians and Gynecologists (ACOG) guidelines for prevention of perinatal group B streptococcal (GBS) disease specify co-collection of a vaginal and rectal swab specimen to maximize sensitivity of GBS detection. Per the CDC and ACOG, swabbing both the lower vagina and rectum substantially increases the yield of detection compared with sampling the vagina alone. Penicillin G, ampicillin, or cefazolin are indicated for intrapartum prophylaxis of perinatal GBS colonization. Reflex susceptibility testing should be performed prior to use of clindamycin only on GBS isolates from penicillin-allergic women who are considered a high risk for anaphylaxis. Treatment with vancomycin without additional testing is warranted if resistance to clindamycin is noted.   Herpes simplex virus culture     Status: None   Collection Time: 01/29/16 12:07 PM  Result Value Ref Range Status   HSV Culture/Type Comment  Final     Comment: Negative No Herpes simplex virus isolated.   OB RESULT CONSOLE Group B Strep     Status: None   Collection Time: 01/30/16 12:00 AM  Result Value Ref Range Status   GBS Negative  Final    Medications:  Ampicillin 2 grams Q6  Assessment: 36 y.o. female [id number] at 6731w2d admitted for IOL with headache and elevated BP concerning for preeclampsia.  Estimated Ke = 0.271 hr-1, Vd = 0.38 L/kg  Goal of Therapy:  Gentamicin peak 6-8 mg/L and Trough < 1 mg/L  Plan:  Gentamicin 220 mg IV x 1  Gentamicin 200 mg IV every 8 hrs  Check Scr with next labs if gentamicin continued. Will check gentamicin levels if continued > 72hr or clinically indicated.  [name] 02/19/2016,6:04 AM

## 2016-02-19 NOTE — Progress Notes (Signed)
CSW acknowledged consult.  MOB denied hx of depression.  MOB acknowledged grief and loss in 2005, after the death of MOB's infant. CSW educated MOB about PPD. CSW informed MOB of possible supports and interventions to decrease PPD.  CSW also encouraged MOB to seek medical attention if needed for increased signs and symptoms for PPD. MOB denied other psycho-social stressors.   Blaine HamperAngel Boyd-Gilyard, MSW, LCSW Clinical Social Work [phone]

## 2016-02-19 NOTE — Lactation Note (Signed)
This note was copied from a baby's chart. Lactation Consultation Note: Lactation brochure given to mother. Mother states that she breastfed her 864 yr old for 16 months. Infant is 7 hours old and has not had a void or stool.   Mother has been breastfeeding well. She states he just finished a 15 min feeding . She hand expresses and observed colostrum. She said, "I know he is getting enough". Suggested to mother that she could hand express and spoon feed infant additional colostrum. Mother also offered a hand pump and she declined. She states she will continue to cue base feed infant.   Mother advised to continue to cue base feed and feed infant 8-12 times in 24 hours. Mother made aware of available lactation services.    Patient Name: [name] UYQIH'KToday's Date: 02/19/2016 Reason for consult: Initial assessment   Maternal Data Has patient been taught Hand Expression?: Yes Does the patient have breastfeeding experience prior to this delivery?: Yes  Feeding Feeding Type: Breast Fed Length of feed: 15 min (per mom)  LATCH Score/Interventions                      Lactation Tools Discussed/Used     Consult Status Consult Status: Follow-up Date: 02/19/16 Follow-up type: In-patient    Stevan BornKendrick, Harish Bram [facility] 02/19/2016, 3:00 PM

## 2016-02-19 NOTE — Progress Notes (Signed)
   [name] is a 36 y.o. [id number] at 4618w2d  admitted for induction of labor due to Hypertension.  Subjective: Comfortable w/epidural  Objective: Vitals:   02/19/16 0449 02/19/16 0523 02/19/16 0531 02/19/16 0618  BP:   135/83   Pulse:   100   Resp:      Temp: 98.4 F (36.9 C) (!) 101.5 F (38.6 C)  99.2 F (37.3 C)  TempSrc: Oral Oral  Oral  SpO2:      Weight:      Height:       Total I/O In: -  Out: 525 [Urine:525]  FHT:  FHR: 160 bpm, variability: moderate,  accelerations:  Present,  decelerations:  Present variable decels.  Provider not notified of varibles UC:   regular, every 2-4 minutes SVE:   Dilation: Lip/rim Effacement (%): 90 Station: 0 Exam by:: Lstubbs, RN Labs: Lab Results  Component Value Date   WBC 13.6 (H) 02/19/2016   HGB 10.2 (L) 02/19/2016   HCT 30.7 (L) 02/19/2016   MCV 71.6 (L) 02/19/2016   PLT 314 02/19/2016    Assessment / Plan: IOL, not on pitocin for 3+ hours, nearing 2nd stage Variable decels, good variability, + accels Labor: Progressing normally Fetal Wellbeing:  Category II Pain Control:  Epidural Anticipated MOD:  NSVD  [name],[name] 02/19/2016, 6:30 AM

## 2016-02-19 NOTE — Anesthesia Procedure Notes (Signed)
Epidural Patient location during procedure: OB Start time: 02/19/2016 2:48 AM End time: 02/19/2016 2:58 AM  Staffing Anesthesiologist: Cristela BlueJACKSON, Lennie Dunnigan  Preanesthetic Checklist Completed: patient identified, site marked, surgical consent, pre-op evaluation, timeout performed, IV checked, risks and benefits discussed and monitors and equipment checked  Epidural Patient position: sitting Prep: site prepped and draped and DuraPrep Patient monitoring: continuous pulse ox and blood pressure Approach: midline Location: L2-L3 Injection technique: LOR air  Needle:  Needle type: Tuohy  Needle gauge: 17 G Needle length: 9 cm and 9 Needle insertion depth: 7 cm Catheter type: closed end flexible Catheter size: 19 Gauge Catheter at skin depth: 14 cm Test dose: negative  Assessment Events: blood not aspirated, injection not painful, no injection resistance, negative IV test and no paresthesia  Additional Notes Dosing of Epidural:  1st dose, through catheter ............................................Marland Kitchen.  Xylocaine 40 mg  2nd dose, through catheter, after waiting 3 minutes........Marland Kitchen.Xylocaine 60 mg    As each dose occurred, patient was free of IV sx; and patient exhibited no evidence of SA injection.  Patient is more comfortable after epidural dosed. Please see RN's note for documentation of vital signs,and FHR which are stable.  Patient reminded not to try to ambulate with numb legs, and that an RN must be present when she attempts to get up.

## 2016-02-19 NOTE — Progress Notes (Signed)
CM / UR chart review completed.  

## 2016-02-19 NOTE — Anesthesia Preprocedure Evaluation (Signed)

## 2016-02-19 NOTE — Progress Notes (Signed)
Pt had SROM w/clear fluid around midnight.  Her pain increased, and she was noted to be 8cms.  Now comfortable w/epidural. FHR Cat 1. Anticipate SVD soon.  Vitals:   02/19/16 0308 02/19/16 0311  BP: (!) 100/51 109/62  Pulse: 100 96  Resp:    Temp:

## 2016-02-20 MED ORDER — DOCUSATE SODIUM 100 MG PO CAPS: 100.0000 mg | ORAL_CAPSULE | Freq: Two times a day (BID) | ORAL | 0 refills | Status: DC

## 2016-02-20 MED ORDER — IBUPROFEN 600 MG PO TABS: 600.0000 mg | ORAL_TABLET | Freq: Four times a day (QID) | ORAL | 0 refills | Status: DC | PRN

## 2016-02-20 NOTE — Discharge Summary (Signed)
Patient not to be discharged today, due to babies status.

## 2016-02-20 NOTE — Lactation Note (Signed)
This note was copied from a baby's chart. Lactation Consultation Note Follow up visit at 38 hours of age.  Baby has had 9 feedings with 6 voids and 4 stools.  Mom denies concerns at this time.  LC discussed with mom maybe needing to hand express or soften breasts prior to latching as her breasts fill and become more firm.  Baby asleep in crib.  Mom to call as needed.     Patient Name: [name]'s Date: 02/20/2016 Reason for consult: Follow-up assessment   Maternal Data Has patient been taught Hand Expression?: Yes  Feeding Feeding Type: Breast Fed Length of feed: 30 min  LATCH Score/Interventions                      Lactation Tools Discussed/Used     Consult Status Consult Status: Follow-up Date: 02/21/16 Follow-up type: In-patient    Beverely RisenShoptaw, Arvella MerlesJana Lynn 02/20/2016, 9:26 PM

## 2016-02-20 NOTE — Progress Notes (Signed)
POSTPARTUM PROGRESS NOTE  Post Partum Day 1 Subjective:  [name] is a 36 y.o. [id number] 3585w2d s/p SVD.  No acute events overnight.  Pt denies problems with ambulating, voiding or po intake.  She denies nausea or vomiting.  Pain is well controlled.  She has had flatus. She has not had bowel movement.  Lochia Small.   Objective: Blood pressure 135/69, pulse 73, temperature 98.3 F (36.8 C), resp. rate 18, height 5' 6.5" (1.689 m), weight (!) 302 lb (137 kg), last menstrual period 05/27/2015, SpO2 98 %, unknown if currently breastfeeding.  Physical Exam:  General: alert, cooperative and no distress Lochia:normal flow Chest: no respiratory distress Heart:regular rate, distal pulses intact Abdomen: soft, nontender,  Uterine Fundus: firm, appropriately tender DVT Evaluation: No calf swelling or tenderness Extremities: trace edema   Recent Labs  02/18/16 1404 02/19/16 0220  HGB 10.1* 10.2*  HCT 30.2* 30.7*    Assessment/Plan:  ASSESSMENT: [name] is a 36 y.o. [id number] 3385w2d s/p SVD  Plan for discharge tomorrow   LOS: 2 days   Les Pouicholas SchenkMD 02/20/2016, 4:54 PM

## 2016-02-21 DIAGNOSIS — O34219 Maternal care for unspecified type scar from previous cesarean delivery: Secondary | ICD-10-CM | POA: Diagnosis not present

## 2016-02-21 NOTE — Discharge Summary (Signed)
OB Discharge Summary     Patient Name: [name] DOB: 01/19/1981 MRN: [id number]  Date of admission: 02/18/2016 Delivering MD: Eloise Levels   Date of discharge: 02/21/2016  Admitting diagnosis: 33WKS, HIGH BP Intrauterine pregnancy: [redacted]w[redacted]d    Secondary diagnosis:  Principal Problem:   VBAC (vaginal birth after Cesarean) Active Problems:   Gestational hypertension  Additional problems: Triple I     Discharge diagnosis: Term Pregnancy Delivered, VBAC, Gestational Hypertension and Triple I                                                                                                Post partum procedures:MMR Vaccine  Augmentation: Cytotec  Complications: None  Hospital course:  Induction of Labor With Vaginal Delivery   36y.o. yo [id number] 328w2das admitted to the hospital 02/18/2016 for induction of labor.  Indication for induction: Gestational hypertension.  Patient had a labor course as follows: Membrane Rupture Time/Date: 12:32 AM ,02/19/2016   Intrapartum Procedures: Episiotomy: None [1]                                         Lacerations:  None [1]   Patient developed triple I during labor. Patient had delivery of a Viable infant.  Information for the patient's newborn:  MoTorunn, Chancellor0[[id number]]Delivery Method: VBAC, Spontaneous (Filed from Delivery Summary)   02/21/2016  Details of delivery can be found in separate delivery note.  Patient had a routine postpartum course. Patient had a single elevated BP last night, all other BPs have been stable. Baby love is to come to the patient house for BP checks, and patient is to follow up in 1 week for BP check in the office. Signs/symptoms of preeclampsia discussed in detail with patient and when to seek medical attention. Patient is discharged home 02/21/16.  Physical exam  Vitals:   02/20/16 0625 02/20/16 1924 02/20/16 2001 02/21/16 0624  BP: 135/69 (!) 146/80 130/79 134/71  Pulse: 73 93  79  Resp: _0 Temp: 98.3 F (36.8 C) 98.1 F (36.7 C)  98.4 F (36.9 C)  TempSrc:  Oral  Oral  SpO2:      Weight:      Height:       General: alert, cooperative and no distress Lochia: appropriate Uterine Fundus: firm Incision: N/A DVT Evaluation: No evidence of DVT seen on physical exam. Negative Homan's sign. No cords or calf tenderness. Labs: Lab Results  Component Value Date   WBC 13.6 (H) 02/19/2016   HGB 10.2 (L) 02/19/2016   HCT 30.7 (L) 02/19/2016   MCV 71.6 (L) 02/19/2016   PLT 314 02/19/2016   CMP Latest Ref Rng & Units 02/18/2016  Glucose 65 - 99 mg/dL 88  BUN 6 - 20 mg/dL 11  Creatinine 0.44 - 1.00 mg/dL 0.85  Sodium 135 - 145 mmol/L 134(L)  Potassium 3.5 - 5.1 mmol/L 3.9  Chloride 101 - 111 mmol/L 106  CO2 22 - 32 mmol/L 21(L)  Calcium 8.9 - 10.3 mg/dL 9.0  Total Protein 6.5 - 8.1 g/dL 7.2  Total Bilirubin 0.3 - 1.2 mg/dL 0.2(L)  Alkaline Phos 38 - 126 U/L 189(H)  AST 15 - 41 U/L 18  ALT 14 - 54 U/L 17    Discharge instruction: per After Visit Summary and "Baby and Me Booklet".  After visit meds:  Allergies as of 02/21/2016      Reactions   Codeine Nausea And Vomiting   PATIENT STATES SHE CAN TAKE PERCOCET.      Medication List    STOP taking these medications   metroNIDAZOLE 500 MG tablet Commonly known as:  FLAGYL   miconazole 2 % vaginal cream Commonly known as:  MONISTAT 7   prenatal multivitamin Tabs tablet     TAKE these medications   butalbital-acetaminophen-caffeine 50-325-40 MG tablet Commonly known as:  FIORICET, ESGIC Take 1-2 tablets by mouth every 6 (six) hours as needed for headache.   docusate sodium 100 MG capsule Commonly known as:  COLACE Take 1 capsule (100 mg total) by mouth 2 (two) times daily.   ibuprofen 600 MG tablet Commonly known as:  ADVIL,MOTRIN Take 1 tablet (600 mg total) by mouth every 6 (six) hours as needed for mild pain or moderate pain.       Diet: low salt diet  Activity: Advance as tolerated. Pelvic  rest for 6 weeks.   Outpatient follow up:1 week for BP check, 6 weeks for postpartum exam Follow up Appt:No future appointments. Follow up Visit:No Follow-up on file.  Postpartum contraception: Progesterone only pills and Combination OCPs  Newborn Data: Live born female  Birth Weight: 6 lb 15.1 oz (3150 g) APGAR: 8, 9  Baby Feeding: Breast Disposition:home with mother   02/21/2016 Katherine Basset, DO OB Fellow

## 2016-02-21 NOTE — Discharge Instructions (Signed)
Home Care Instructions for Mom °Introduction ° ACTIVITY °· Gradually return to your regular activities. °· Let yourself rest. Nap while your baby sleeps. °· Avoid lifting anything that is heavier than 10 lb (4.5 kg) until your health care provider says it is okay. °· Avoid activities that take a lot of effort and energy (are strenuous) until approved by your health care provider. Walking at a slow-to-moderate pace is usually safe. °· If you had a cesarean delivery: °¨ Do not vacuum, climb stairs, or drive a car for 4-6 weeks. °¨ Have someone help you at home until you feel like you can do your usual activities yourself. °¨ Do exercises as told by your health care provider, if this applies. °VAGINAL BLEEDING °You may continue to bleed for 4-6 weeks after delivery. Over time, the amount of blood usually decreases and the color of the blood usually gets lighter. However, the flow of bright red blood may increase if you have been too active. If you need to use more than one pad in an hour because your pad gets soaked, or if you pass a large clot: °· Lie down. °· Raise your feet. °· Place a cold compress on your lower abdomen. °· Rest. °· Call your health care provider. °If you are breastfeeding, your period should return anytime between 8 weeks after delivery and the time that you stop breastfeeding. If you are not breastfeeding, your period should return 6-8 weeks after delivery. °PERINEAL CARE °The perineal area, or perineum, is the part of your body between your thighs. After delivery, this area needs special care. Follow these instructions as told by your health care provider. °· Take warm tub baths for 15-20 minutes. °· Use medicated pads and pain-relieving sprays and creams as told. °· Do not use tampons or douches until vaginal bleeding has stopped. °· Each time you go to the bathroom: °¨ Use a peri bottle. °¨ Change your pad. °¨ Use towelettes in place of toilet paper until your stitches have healed. °· Do Kegel  exercises every day. Kegel exercises help to maintain the muscles that support the vagina, bladder, and bowels. You can do these exercises while you are standing, sitting, or lying down. To do Kegel exercises: °¨ Tighten the muscles of your abdomen and the muscles that surround your birth canal. °¨ Hold for a few seconds. °¨ Relax. °¨ Repeat until you have done this 5 times in a row. °· To prevent hemorrhoids from developing or getting worse: °¨ Drink enough fluid to keep your urine clear or pale yellow. °¨ Avoid straining when having a bowel movement. °¨ Take over-the-counter medicines and stool softeners as told by your health care provider. °BREAST CARE °· Wear a tight-fitting bra. °· Avoid taking over-the-counter pain medicine for breast discomfort. °· Apply ice to the breasts to help with discomfort as needed: °¨ Put ice in a plastic bag. °¨ Place a towel between your skin and the bag. °¨ Leave the ice on for 20 minutes or as told by your health care provider. °NUTRITION °· Eat a well-balanced diet. °· Do not try to lose weight quickly by cutting back on calories. °· Take your prenatal vitamins until your postpartum checkup or until your health care provider tells you to stop. °POSTPARTUM DEPRESSION °You may find yourself crying for no apparent reason and unable to cope with all of the changes that come with having a newborn. This mood is called postpartum depression. Postpartum depression happens because your hormone levels change after   delivery. If you have postpartum depression, get support from your partner, friends, and family. If the depression does not go away on its own after several weeks, contact your health care provider. BREAST SELF-EXAM Do a breast self-exam each month, at the same time of the month. If you are breastfeeding, check your breasts just after a feeding, when your breasts are less full. If you are breastfeeding and your period has started, check your breasts on day 5, 6, or 7 of your  period. Report any lumps, bumps, or discharge to your health care provider. Know that breasts are normally lumpy if you are breastfeeding. This is temporary, and it is not a health risk. INTIMACY AND SEXUALITY Avoid sexual activity for at least 3-4 weeks after delivery or until the brownish-red vaginal flow is completely gone. If you want to avoid pregnancy, use some form of birth control. You can get pregnant after delivery, even if you have not had your period. SEEK MEDICAL CARE IF:  You feel unable to cope with the changes that a child brings to your life, and these feelings do not go away after several weeks.  You notice a lump, a bump, or discharge on your breast. SEEK IMMEDIATE MEDICAL CARE IF:  Blood soaks your pad in 1 hour or less.  You have:  Severe pain or cramping in your lower abdomen.  A bad-smelling vaginal discharge.  A fever that is not controlled by medicine.  A fever, and an area of your breast is red and sore.  Pain or redness in your calf.  Sudden, severe chest pain.  Shortness of breath.  Painful or bloody urination.  Problems with your vision.  You vomit for 12 hours or longer.  You develop a severe headache.  You have serious thoughts about hurting yourself, your child, or anyone else. This information is not intended to replace advice given to you by your health care provider. Make sure you discuss any questions you have with your health care provider. Document Released: 01/15/2000 Document Revised: 06/25/2015 Document Reviewed: 07/21/2014  2017 Elsevier   Preeclampsia and Eclampsia Preeclampsia is a serious condition that develops only during pregnancy. It is also called toxemia of pregnancy. This condition causes high blood pressure along with other symptoms, such as swelling and headaches. These symptoms may develop as the condition gets worse. Preeclampsia may occur at 20 weeks of pregnancy or later. Diagnosing and treating preeclampsia early  is very important. If not treated early, it can cause serious problems for you and your baby. One problem it can lead to is eclampsia, which is a condition that causes muscle jerking or shaking (convulsions or seizures) in the mother. Delivering your baby is the best treatment for preeclampsia or eclampsia. Preeclampsia and eclampsia symptoms usually go away after your baby is born. What are the causes? The cause of preeclampsia is not known. What increases the risk? The following risk factors make you more likely to develop preeclampsia:  Being pregnant for the first time.  Having had preeclampsia during a past pregnancy.  Having a family history of preeclampsia.  Having high blood pressure.  Being pregnant with twins or triplets.  Being 4935 or older.  Being African-American.  Having kidney disease or diabetes.  Having medical conditions such as lupus or blood diseases.  Being very overweight (obese). What are the signs or symptoms? The earliest signs of preeclampsia are:  High blood pressure.  Increased protein in your urine. Your health care provider will check for this  at every visit before you give birth (prenatal visit). Other symptoms that may develop as the condition gets worse include:  Severe headaches.  Sudden weight gain.  Swelling of the hands, face, legs, and feet.  Nausea and vomiting.  Vision problems, such as blurred or double vision.  Numbness in the face, arms, legs, and feet.  Urinating less than usual.  Dizziness.  Slurred speech.  Abdominal pain, especially upper abdominal pain.  Convulsions or seizures. Symptoms generally go away after giving birth. How is this diagnosed? There are no screening tests for preeclampsia. Your health care provider will ask you about symptoms and check for signs of preeclampsia during your prenatal visits. You may also have tests that include:  Urine tests.  Blood tests.  Checking your blood  pressure.  Monitoring your babys heart rate.  Ultrasound. How is this treated? You and your health care provider will determine the treatment approach that is best for you. Treatment may include:  Having more frequent prenatal exams to check for signs of preeclampsia, if you have an increased risk for preeclampsia.  Bed rest.  Reducing how much salt (sodium) you eat.  Medicine to lower your blood pressure.  Staying in the hospital, if your condition is severe. There, treatment will focus on controlling your blood pressure and the amount of fluids in your body (fluid retention).  You may need to take medicine (magnesium sulfate) to prevent seizures. This medicine may be given as an injection or through an IV tube.  Delivering your baby early, if your condition gets worse. You may have your labor started with medicine (induced), or you may have a cesarean delivery. Follow these instructions at home: Eating and drinking  Drink enough fluid to keep your urine clear or pale yellow.  Eat a healthy diet that is low in sodium. Do not add salt to your food. Check nutrition labels to see how much sodium a food or beverage contains.  Avoid caffeine. Lifestyle  Do not use any products that contain nicotine or tobacco, such as cigarettes and e-cigarettes. If you need help quitting, ask your health care provider.  Do not use alcohol or drugs.  Avoid stress as much as possible. Rest and get plenty of sleep. General instructions  Take over-the-counter and prescription medicines only as told by your health care provider.  When lying down, lie on your side. This keeps pressure off of your baby.  When sitting or lying down, raise (elevate) your feet. Try putting some pillows underneath your lower legs.  Exercise regularly. Ask your health care provider what kinds of exercise are best for you.  Keep all follow-up and prenatal visits as told by your health care provider. This is  important. How is this prevented? To prevent preeclampsia or eclampsia from developing during another pregnancy:  Get proper medical care during pregnancy. Your health care provider may be able to prevent preeclampsia or diagnose and treat it early.  Your health care provider may have you take a low-dose aspirin or a calcium supplement during your next pregnancy.  You may have tests of your blood pressure and kidney function after giving birth.  Maintain a healthy weight. Ask your health care provider for help managing weight gain during pregnancy.  Work with your health care provider to manage any long-term (chronic) health conditions you have, such as diabetes or kidney problems. Contact a health care provider if:  You gain more weight than expected.  You have headaches.  You have nausea or  vomiting.  You have abdominal pain.  You feel dizzy or light-headed. Get help right away if:  You develop sudden or severe swelling anywhere in your body. This usually happens in the legs.  You gain 5 lbs (2.3 kg) or more during one week.  You have severe:  Abdominal pain.  Headaches.  Dizziness.  Vision problems.  Confusion.  Nausea or vomiting.  You have a seizure.  You have trouble moving any part of your body.  You develop numbness in any part of your body.  You have trouble speaking.  You have any abnormal bleeding.  You pass out. This information is not intended to replace advice given to you by your health care provider. Make sure you discuss any questions you have with your health care provider. Document Released: 01/15/2000 Document Revised: 09/15/2015 Document Reviewed: 08/24/2015 Elsevier Interactive Patient Education  2017 [employer].

## 2016-02-21 NOTE — Lactation Note (Signed)
This note was copied from a baby's chart. Lactation Consultation Note: Exp BF mom. Reports baby has been nursing well.  Slight pain on left nipple- looks intact- encouraged hand expression and to rub EBM into nipple.  Reports breasts are beginning to feel heavier. [facility] Offered manual pump- reviewed setup, use and cleaning of pump pieces.  Reviewed engorgement prevention and treatment Reviewed our phone number, OP appointments and BFSG as resources for support after DC No questions at present. To call prn  Patient Name: [name] ZOXWR'UToday's Date: 02/21/2016 Reason for consult: Follow-up assessment   Maternal Data Formula Feeding for Exclusion: No Has patient been taught Hand Expression?: Yes Does the patient have breastfeeding experience prior to this delivery?: Yes  Feeding Feeding Type: Breast Fed Length of feed: 30 min  LATCH Score/Interventions Latch: Grasps breast easily, tongue down, lips flanged, rhythmical sucking.  Audible Swallowing: A few with stimulation  Type of Nipple: Everted at rest and after stimulation  Comfort (Breast/Nipple): Soft / non-tender     Hold (Positioning): No assistance needed to correctly position infant at breast.  LATCH Score: 9  Lactation Tools [facility]iscussed/[facility] Pump Review: Setup, frequency, and cleaning Initiated by:: DW Date initiated:: 02/21/16   Consult Status Consult Status: Complete    Pamelia HoitWeeks, Anthone Prieur D 02/21/2016, 10:15 AM

## 2016-03-31 ENCOUNTER — Ambulatory Visit (INDEPENDENT_AMBULATORY_CARE_PROVIDER_SITE_OTHER): Payer: Medicaid Other | Admitting: Obstetrics and Gynecology

## 2016-03-31 ENCOUNTER — Encounter: Payer: Self-pay | Admitting: Obstetrics and Gynecology

## 2016-03-31 VITALS — BP 147/94 | HR 76 | Temp 97.2°F | Wt 280.7 lb

## 2016-03-31 DIAGNOSIS — IMO0001 Reserved for inherently not codable concepts without codable children: Secondary | ICD-10-CM | POA: Insufficient documentation

## 2016-03-31 DIAGNOSIS — O34219 Maternal care for unspecified type scar from previous cesarean delivery: Secondary | ICD-10-CM

## 2016-03-31 DIAGNOSIS — Z30011 Encounter for initial prescription of contraceptive pills: Secondary | ICD-10-CM

## 2016-03-31 DIAGNOSIS — O139 Gestational [pregnancy-induced] hypertension without significant proteinuria, unspecified trimester: Secondary | ICD-10-CM

## 2016-03-31 DIAGNOSIS — F418 Other specified anxiety disorders: Secondary | ICD-10-CM

## 2016-03-31 MED ORDER — AMLODIPINE BESYLATE 5 MG PO TABS: 5.0000 mg | ORAL_TABLET | Freq: Every day | ORAL | 1 refills | Status: DC

## 2016-03-31 MED ORDER — NORETHINDRONE 0.35 MG PO TABS: 1.0000 | ORAL_TABLET | Freq: Every day | ORAL | 11 refills | Status: DC

## 2016-03-31 NOTE — Progress Notes (Signed)
Subjective:     [name] is a 36 y.o. female who presents for a postpartum visit. She is 6 weeks postpartum following [facility] Magnesium x 24 hrs. D/C home without BP meds. I have fully reviewed the prenatal and intrapartum course. The delivery was at 38 gestational weeks. Outcome: vaginal birth after cesarean (VBAC). Anesthesia: epidural. Postpartum course has been UNREMARKABLE. Baby's course has been UNREMARKABLE. Baby is feeding by breast. Bleeding no bleeding. Bowel function is abnormal: constipated. Bladder function is normal. Patient is sexually active. Contraception method is none. Postpartum depression screening: positive.  Pt has a H/O of depression and anxiety. Currently denies any homo/suicidal thoughts   Review of Systems Pertinent items are noted in HPI.   Objective:    There were no vitals taken for this visit.  General:  alert   Breasts:  not evaluated  Lungs: clear to auscultation bilaterally  Heart:  regular rate and rhythm, S1, S2 normal, no murmur, click, rub or gallop  Abdomen: soft, non-tender; bowel sounds normal; no masses,  no organomegaly   Vulva:  not evaluated  Vagina: not evaluated  Cervix:  not evaluated  Corpus: not examined  Adnexa:  not evaluated  Rectal Exam: Not performed.        Assessment:     Normal postpartum exam.   HTN   Depression/anxiety  Plan:    1. Contraception: oral progesterone-only contraceptive 2. Will start Norvasc. She is to follow up with FM in the next 1-2 weeks 3. Offered counseling and/or antidepressant medication. Pt declined both. 3. Follow up in: 1 yr or as needed.

## 2016-03-31 NOTE — Patient Instructions (Signed)
Oral Contraception Information Oral contraceptive pills (OCPs) are medicines taken to prevent pregnancy. OCPs work by preventing the ovaries from releasing eggs. The hormones in OCPs also cause the cervical mucus to thicken, preventing the sperm from entering the uterus. The hormones also cause the uterine lining to become thin, not allowing a fertilized egg to attach to the inside of the uterus. OCPs are highly effective when taken exactly as prescribed. However, OCPs do not prevent sexually transmitted diseases (STDs). Safe sex practices, such as using condoms along with the pill, can help prevent STDs. Before taking the pill, you may have a physical exam and Pap test. Your health care provider may order blood tests. The health care provider will make sure you are a good candidate for oral contraception. Discuss with your health care provider the possible side effects of the OCP you may be prescribed. When starting an OCP, it can take 2 to 3 months for the body to adjust to the changes in hormone levels in your body. Types of oral contraception  The combination pill-This pill contains estrogen and progestin (synthetic progesterone) hormones. The combination pill comes in 21-day, 28-day, or 91-day packs. Some types of combination pills are meant to be taken continuously (365-day pills). With 21-day packs, you do not take pills for 7 days after the last pill. With 28-day packs, the pill is taken every day. The last 7 pills are without hormones. Certain types of pills have more than 21 hormone-containing pills. With 91-day packs, the first 84 pills contain both hormones, and the last 7 pills contain no hormones or contain estrogen only.  The minipill-This pill contains the progesterone hormone only. The pill is taken every day continuously. It is very important to take the pill at the same time each day. The minipill comes in packs of 28 pills. All 28 pills contain the hormone. Advantages of oral  contraceptive pills  Decreases premenstrual symptoms.  Treats menstrual period cramps.  Regulates the menstrual cycle.  Decreases a heavy menstrual flow.  May treatacne, depending on the type of pill.  Treats abnormal uterine bleeding.  Treats polycystic ovarian syndrome.  Treats endometriosis.  Can be used as emergency contraception. Things that can make oral contraceptive pills less effective OCPs can be less effective if:  You forget to take the pill at the same time every day.  You have a stomach or intestinal disease that lessens the absorption of the pill.  You take OCPs with other medicines that make OCPs less effective, such as antibiotics, certain HIV medicines, and some seizure medicines.  You take expired OCPs.  You forget to restart the pill on day 7, when using the packs of 21 pills. Risks associated with oral contraceptive pills Oral contraceptive pills can sometimes cause side effects, such as:  Headache.  Nausea.  Breast tenderness.  Irregular bleeding or spotting. Combination pills are also associated with a small increased risk of:  Blood clots.  Heart attack.  Stroke. This information is not intended to replace advice given to you by your health care provider. Make sure you discuss any questions you have with your health care provider. Document Released: 04/09/2002 Document Revised: 06/25/2015 Document Reviewed: 07/08/2012 Elsevier Interactive Patient Education  2017 Elsevier Inc.  

## 2016-08-18 ENCOUNTER — Other Ambulatory Visit: Payer: Self-pay | Admitting: Oral Surgery

## 2016-09-05 IMAGING — US US MFM FETAL NUCHAL TRANSLUCENCY
1 series · 15 of 28 positions shown · non-contrast
Comparison: none

[Series 1: us mfm fetal nuchal translucency · 15 of 39 slices shown]
[im 1/39]
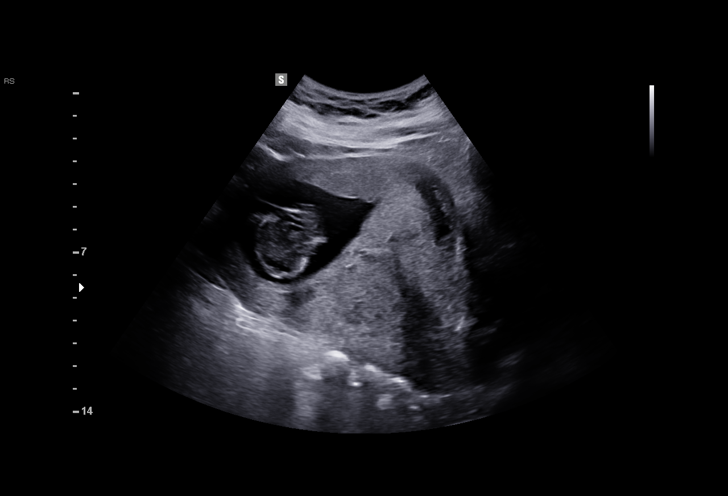
[im 3/39]
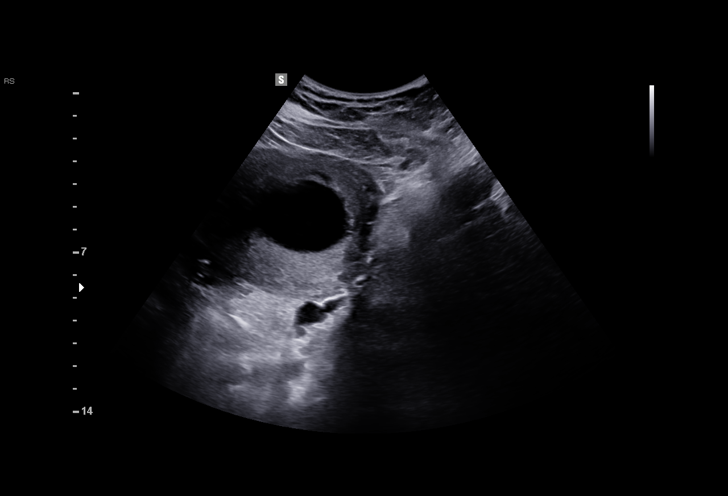
[im 6/39]
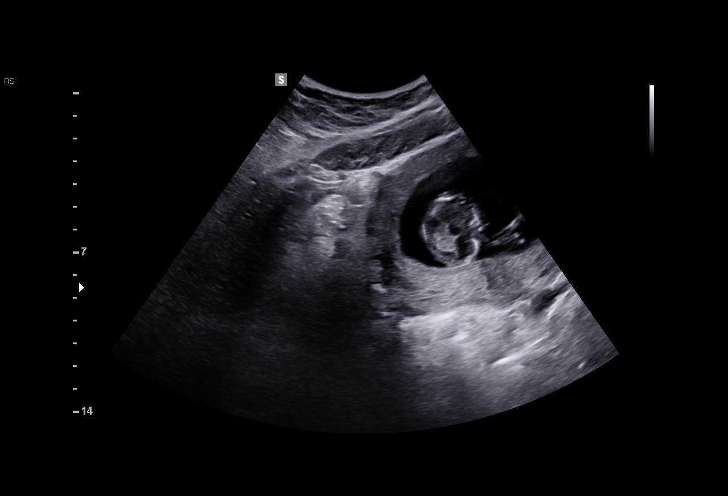
[im 9/39]
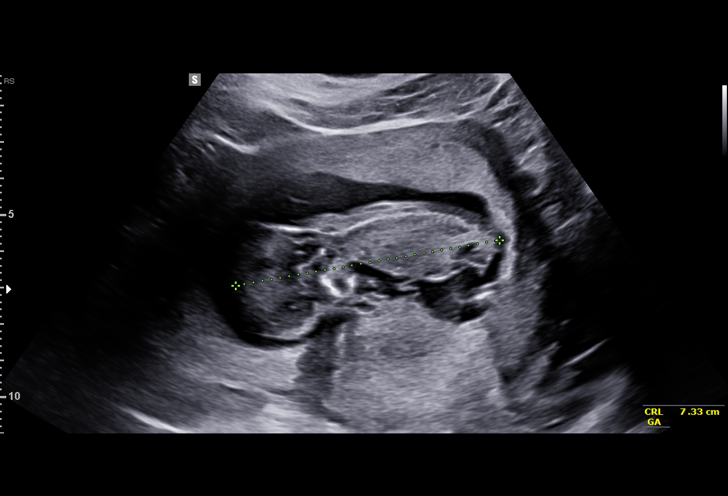
[im 12/39]
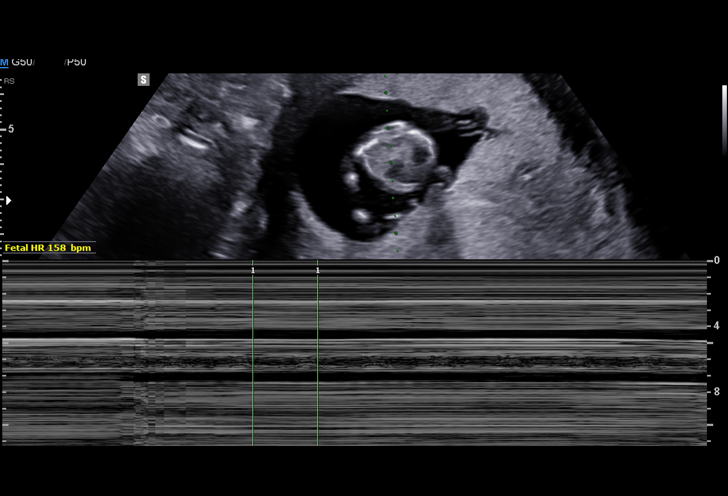
[im 15/39]
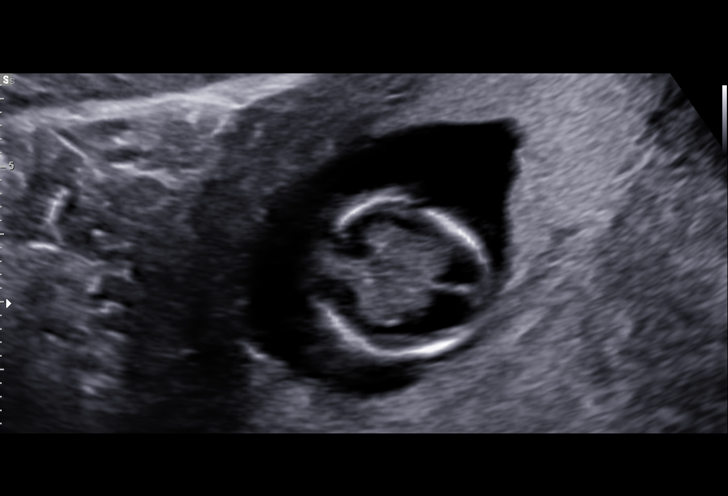
[im 17/39]
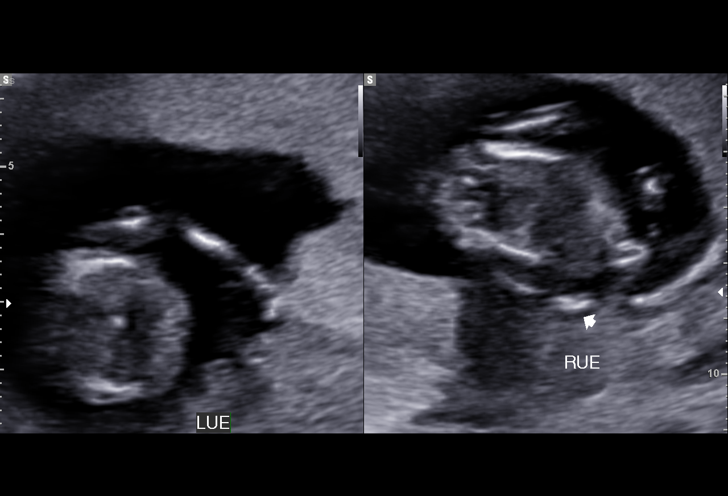
[im 20/39]
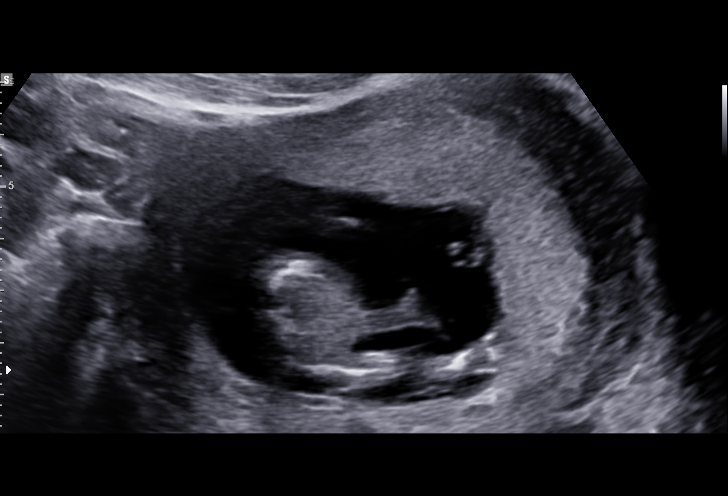
[im 22/39]
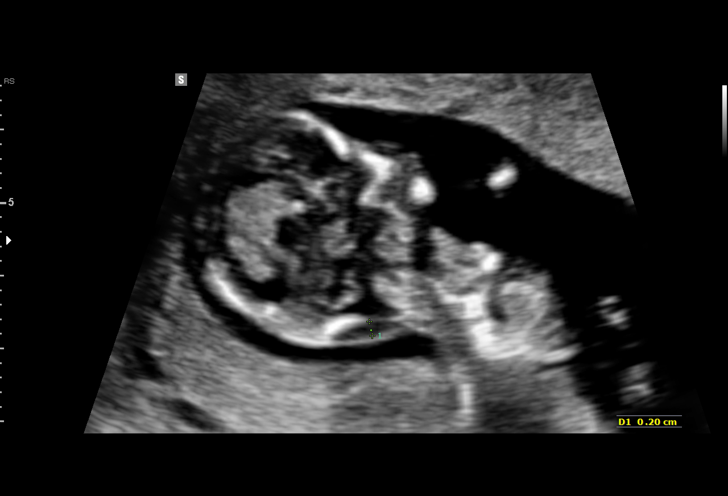
[im 24/39]
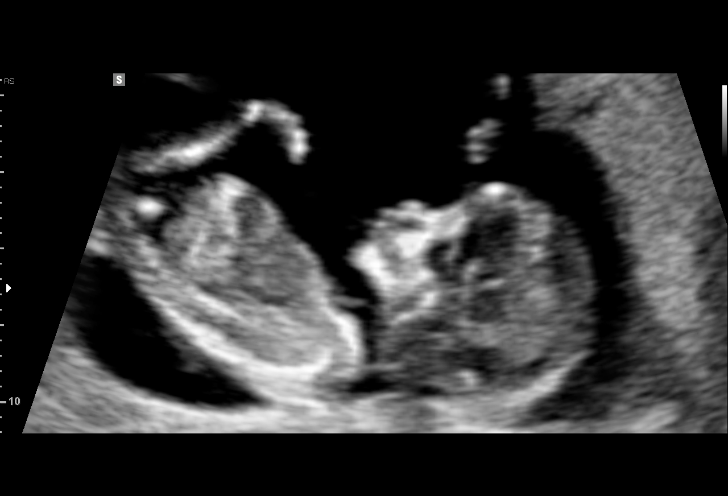
[im 27/39]
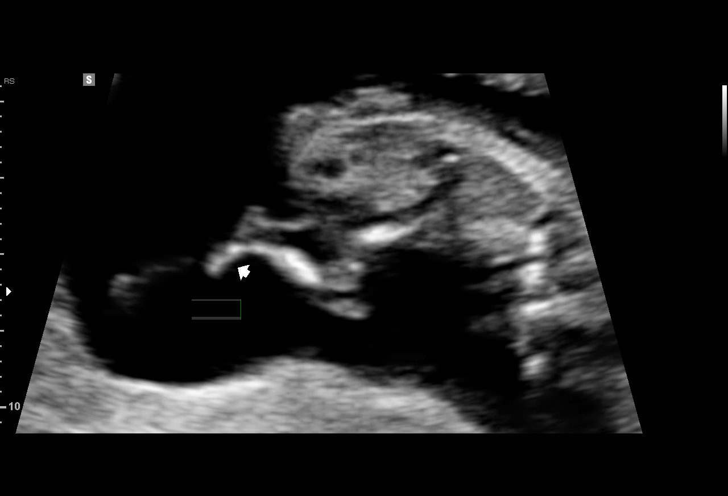
[im 30/39]
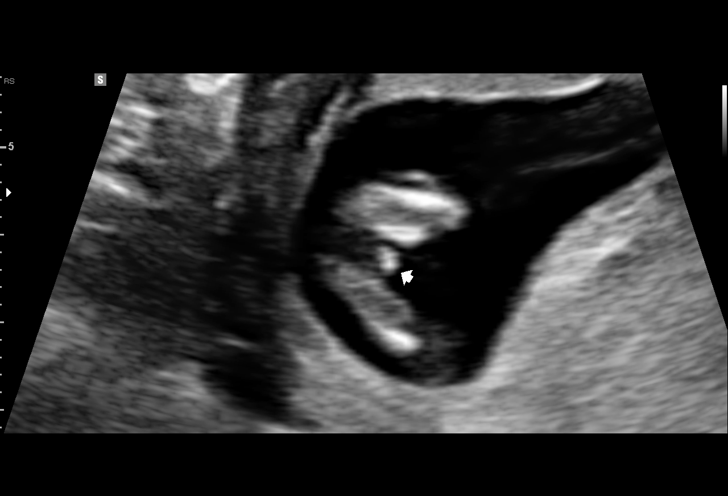
[im 33/39]
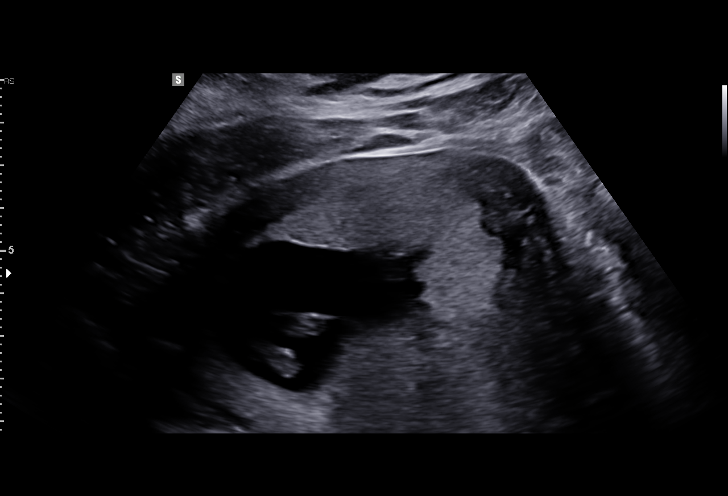
[im 36/39]
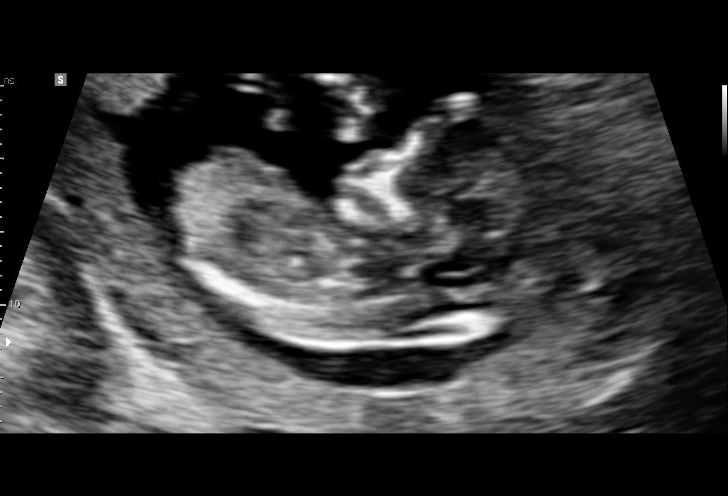
[im 39/39]
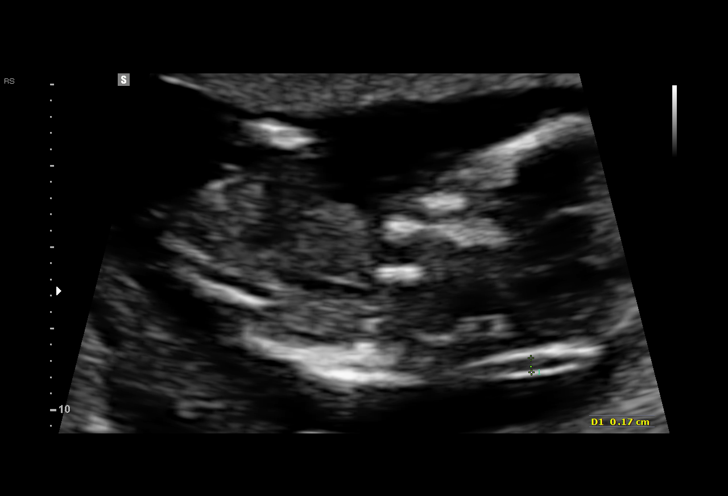

[15 of 28 positions shown; findings below may reference images not displayed]

TRANSLUCENCY

1  JEAN BAPTISTE SPERRY           [id number]      [id number]     [id number]
Indications

13 weeks gestation of pregnancy
First trimester aneuploidy screen (NT)         Z36
Obesity complicating pregnancy, first
trimester
OB History

Blood Type:            Height:  5'6"   Weight (lb):  254      BMI:
Gravidity:    7         Term:   2         SAB:   2
TOP:          1        Living:  2
Fetal Evaluation

Num Of Fetuses:     1
Fetal Heart         158
Rate(bpm):
Cardiac Activity:   Observed
Presentation:       Variable
Placenta:           Anterior

Amniotic Fluid
AFI FV:      Subjectively within normal limits

Largest Pocket(cm)
3.79
Biometry

CRL:      73.3  mm     G. Age:  13w 1d                  EDD:   03/01/16

NT:        1.8  mm
Gestational Age

LMP:           13w 0d       Date:   05/27/15                 EDD:   03/02/16
Best:          13w 0d    Det. By:   LMP  (05/27/15)          EDD:   03/02/16
Anatomy

Choroid Plexus:        Appears normal         Bladder:                Appears normal
Stomach:               Appears normal, left
sided
Cervix Uterus Adnexa

Uterus
No abnormality visualized.

Left Ovary
Not visualized.

Right Ovary
Not visualized.

Adnexa:       No abnormality visualized. No adnexal mass
visualized.
Impression

Single IUP at 13w 0d
NT 1.8 mm.  Unable to visualize the nasal bone due to fetal
position
First trimester aneuploidy screen performed as noted above.
Please do not draw triple/quad screen, though patient should
be offered MSAFP for neural tube defect screening.
Recommendations

Recommend ultrasound for fetal anatomy at 18-20 weeks

## 2016-12-28 IMAGING — US US MFM OB FOLLOW-UP
1 series · 14 of 28 positions shown · non-contrast
Comparison: none

[Series 1: us mfm ob follow-up · 14 of 41 slices shown]
[im 2/41]
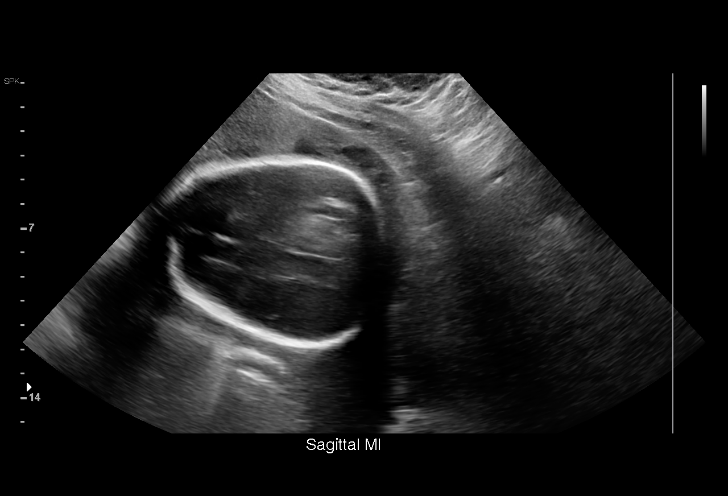
[im 5/41]
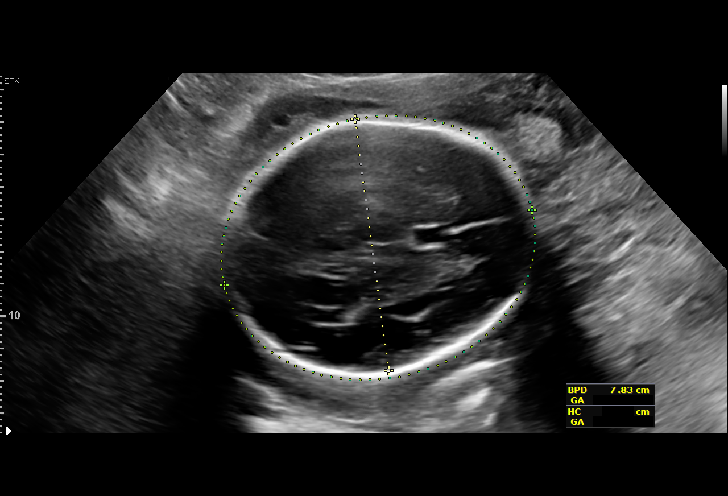
[im 8/41]
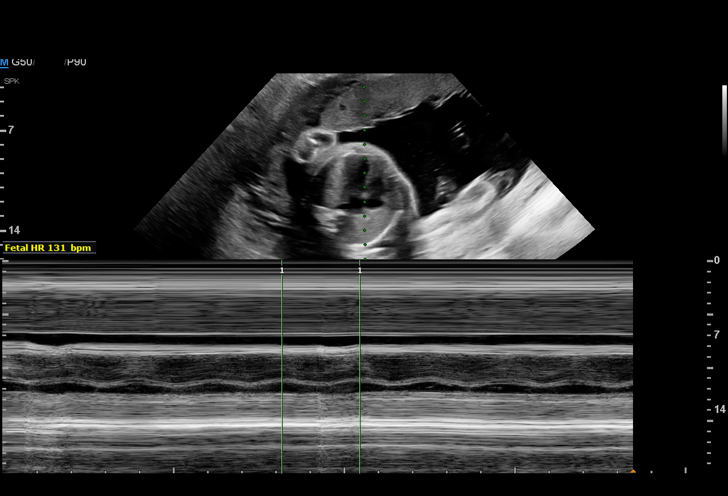
[im 11/41]
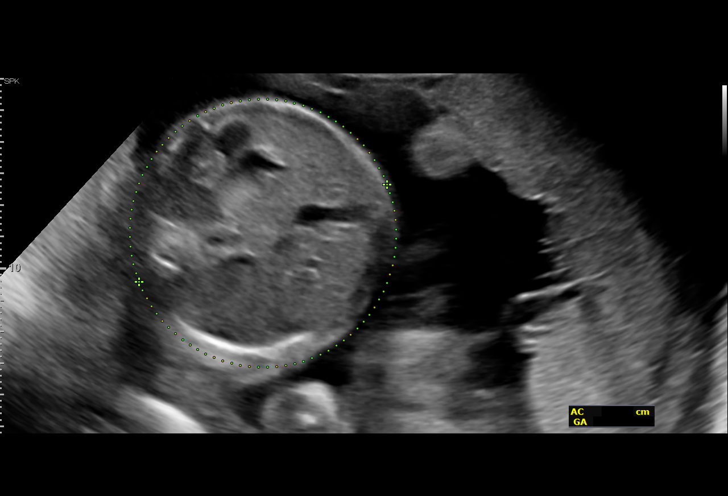
[im 14/41]
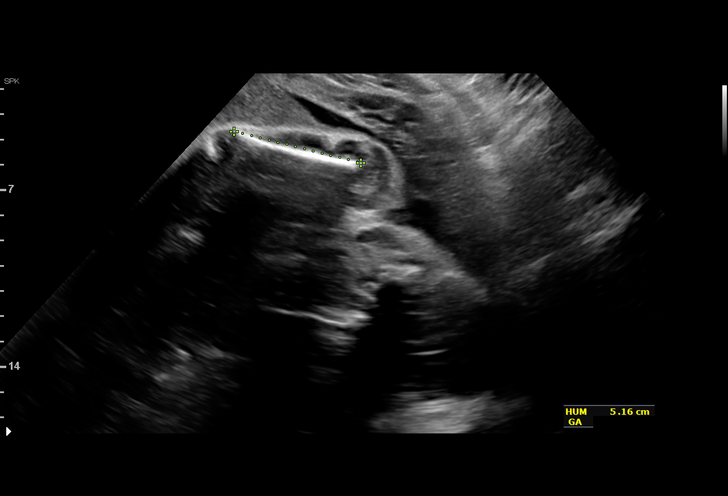
[im 17/41]
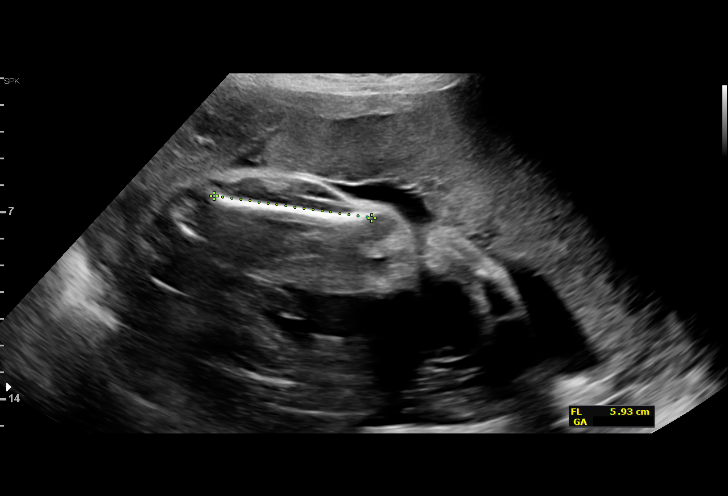
[im 20/41]
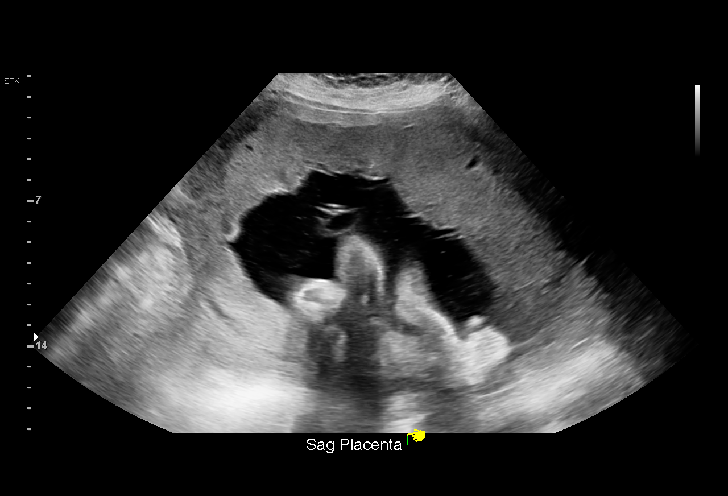
[im 23/41]
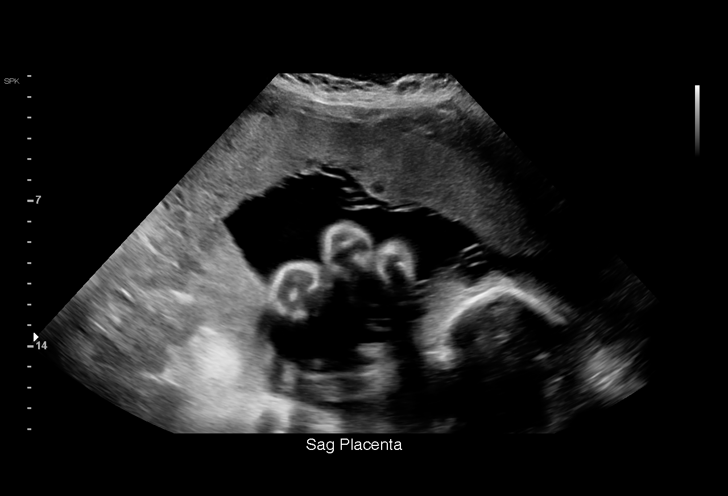
[im 26/41]
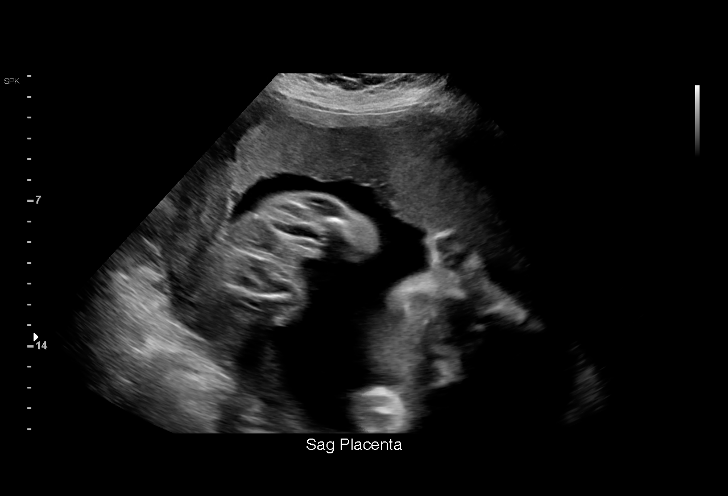
[im 29/41]
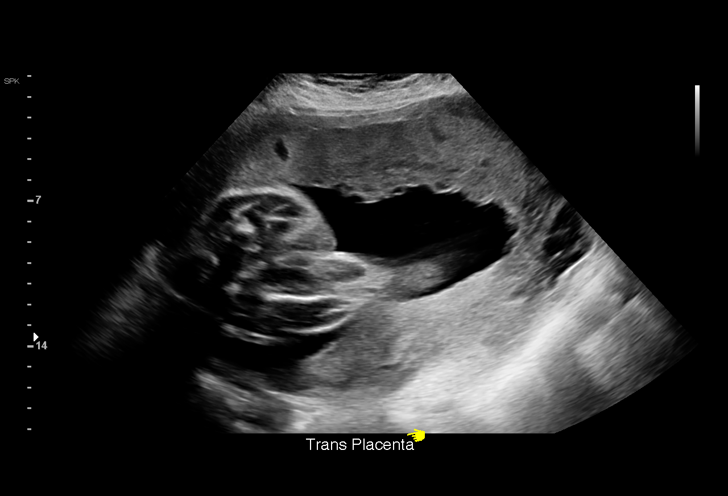
[im 32/41]
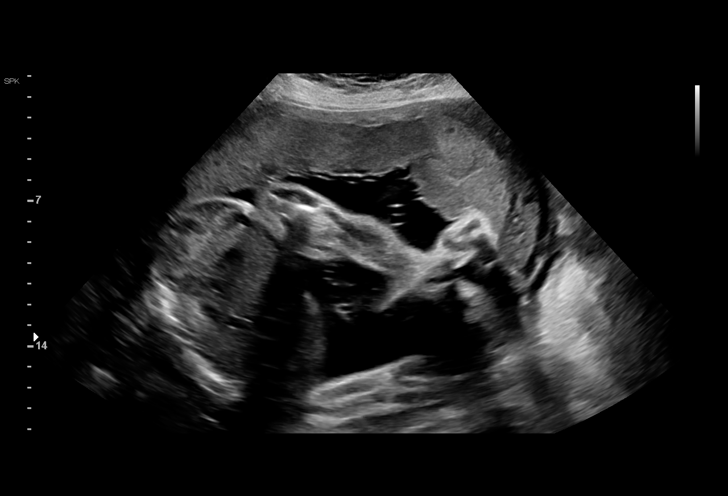
[im 35/41]
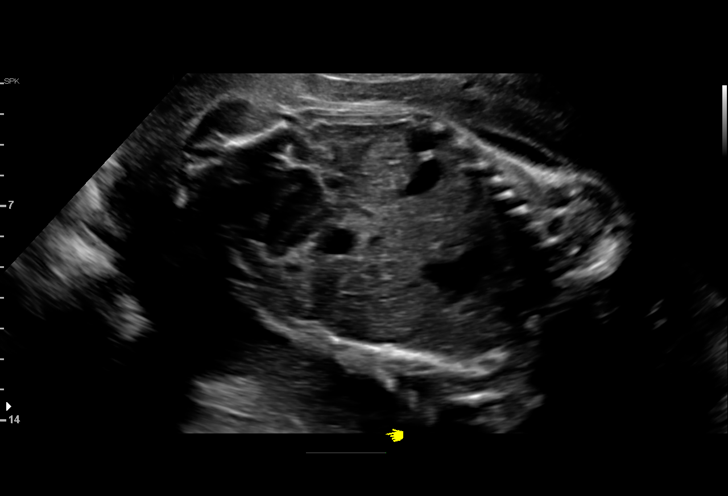
[im 38/41]
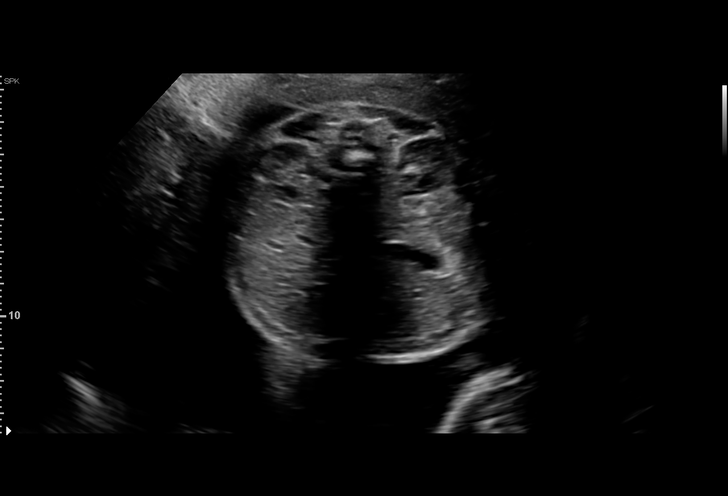
[im 41/41]
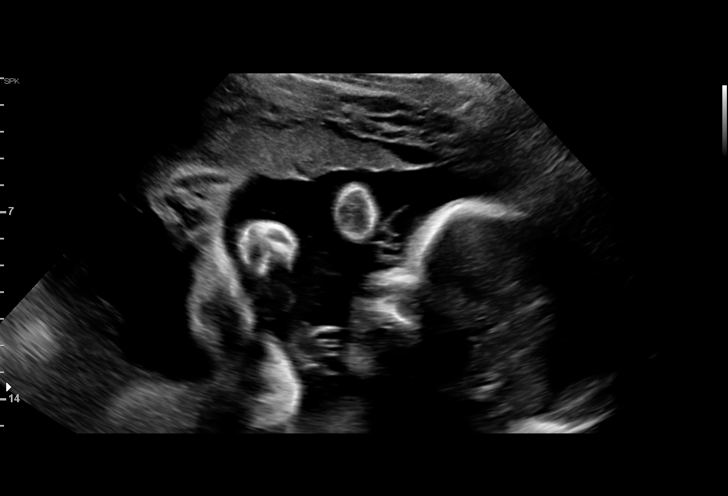

[14 of 28 positions shown; findings below may reference images not displayed]

1  ERVISTA CEOLA            [id number]      [id number]     [id number]
Indications

29 weeks gestation of pregnancy
Obesity complicating pregnancy, second
trimester
OB History

Blood Type:            Height:  5'6"   Weight (lb):  254      BMI:
Gravidity:    7         Term:   2         SAB:   2
TOP:          1        Living:  2
Fetal Evaluation

Num Of Fetuses:     1
Fetal Heart         131
Rate(bpm):
Cardiac Activity:   Observed
Presentation:       Cephalic
Placenta:           Anterior Fundal, above cervical os
P. Cord Insertion:  Previously seen as normal

Amniotic Fluid
AFI FV:      Subjectively within normal limits

AFI Sum(cm)     %Tile       Largest Pocket(cm)
12.26           31

RUQ(cm)       RLQ(cm)       LUQ(cm)        LLQ(cm)
3.98
Biometry
BPD:      77.6  mm     G. Age:  31w 1d         89  %    CI:        79.03   %   70 - 86
FL/HC:      20.5   %   19.6 -
HC:       276   mm     G. Age:  30w 1d         42  %    HC/AC:      1.07       0.99 -
AC:       258   mm     G. Age:  30w 0d         64  %    FL/BPD:     72.8   %   71 - 87
FL:       56.5  mm     G. Age:  29w 5d         47  %    FL/AC:      21.9   %   20 - 24
HUM:      50.4  mm     G. Age:  29w 4d         51  %

Est. FW:    0788  gm      3 lb 4 oz     65  %
Gestational Age

LMP:           29w 2d       Date:   05/27/15                 EDD:   03/02/16
U/S Today:     30w 2d                                        EDD:   02/24/16
Best:          29w 2d    Det. By:   LMP  (05/27/15)          EDD:   03/02/16
Anatomy

Cranium:               Appears normal         Aortic Arch:            Previously seen
Cavum:                 Appears normal         Ductal Arch:            Previously seen
Ventricles:            Appears normal         Diaphragm:              Previously seen
Choroid Plexus:        Previously seen        Stomach:                Appears normal, left
sided
Cerebellum:            Previously seen        Abdomen:                Appears normal
Posterior Fossa:       Previously seen        Abdominal Wall:         Previously seen
Nuchal Fold:           Not applicable (>20    Cord Vessels:           Previously seen
wks GA)
Face:                  Appears normal         Kidneys:                Appear normal
(orbits and profile)
Lips:                  Previously seen        Bladder:                Appears normal
Thoracic:              Appears normal         Spine:                  Previously seen
Heart:                 Appears normal         Upper Extremities:      Previously seen
(4CH, axis, and situs
RVOT:                  Previously seen        Lower Extremities:      Previously seen
LVOT:                  Previously seen

Other:  Fetus appears to be a male. Nasal bone visualized. Technically
difficult due to maternal habitus and fetal position.
Cervix Uterus Adnexa

Cervix
Not visualized (advanced GA >12wks)

Uterus
No abnormality visualized.

Left Ovary
Not visualized.

Right Ovary
Not visualized.

Adnexa:       No abnormality visualized.
Impression

Single IUP at 29w 2d
Obesity
Normal (limited) interval anatomy
The estimated fetal weight today is at the 65th %tile.
Anterior fundal placenta
Normal amniotic fluid volume
Recommendations

Recommend follow-up ultrasound examination in 4 weeks for
growth.

## 2017-03-14 ENCOUNTER — Other Ambulatory Visit: Payer: Self-pay

## 2017-03-14 ENCOUNTER — Inpatient Hospital Stay (HOSPITAL_COMMUNITY): Payer: Medicaid Other

## 2017-03-14 ENCOUNTER — Encounter: Payer: Self-pay | Admitting: Student

## 2017-03-14 ENCOUNTER — Inpatient Hospital Stay (HOSPITAL_COMMUNITY)
Admission: AD | Admit: 2017-03-14 | Discharge: 2017-03-14 | Disposition: A | Discharge status: Home / Self Care | Payer: Medicaid Other | Source: Ambulatory Visit | Attending: Family Medicine | Admitting: Family Medicine

## 2017-03-14 DIAGNOSIS — O200 Threatened abortion: Secondary | ICD-10-CM

## 2017-03-14 DIAGNOSIS — F329 Major depressive disorder, single episode, unspecified: Secondary | ICD-10-CM | POA: Insufficient documentation

## 2017-03-14 DIAGNOSIS — Z3A01 Less than 8 weeks gestation of pregnancy: Secondary | ICD-10-CM

## 2017-03-14 DIAGNOSIS — O99341 Other mental disorders complicating pregnancy, first trimester: Secondary | ICD-10-CM | POA: Insufficient documentation

## 2017-03-14 DIAGNOSIS — O99211 Obesity complicating pregnancy, first trimester: Secondary | ICD-10-CM | POA: Insufficient documentation

## 2017-03-14 DIAGNOSIS — O209 Hemorrhage in early pregnancy, unspecified: Secondary | ICD-10-CM | POA: Diagnosis present

## 2017-03-14 DIAGNOSIS — Z79899 Other long term (current) drug therapy: Secondary | ICD-10-CM | POA: Insufficient documentation

## 2017-03-14 DIAGNOSIS — F418 Other specified anxiety disorders: Secondary | ICD-10-CM | POA: Insufficient documentation

## 2017-03-14 LAB — CBC
HEMATOCRIT: 35.2 % — AB (ref 36.0–46.0)
Hemoglobin: 11.8 g/dL — ABNORMAL LOW (ref 12.0–15.0)
MCH: 25.2 pg — ABNORMAL LOW (ref 26.0–34.0)
MCHC: 33.5 g/dL (ref 30.0–36.0)
MCV: 75.2 fL — ABNORMAL LOW (ref 78.0–100.0)
Platelets: 252 10*3/uL (ref 150–400)
RBC: 4.68 MIL/uL (ref 3.87–5.11)
RDW: 13.9 % (ref 11.5–15.5)
WBC: 7.4 10*3/uL (ref 4.0–10.5)

## 2017-03-14 LAB — HCG, QUANTITATIVE, PREGNANCY: hCG, Beta Chain, Quant, S: 5435 m[IU]/mL — ABNORMAL HIGH (ref <5)

## 2017-03-14 LAB — POCT PREGNANCY, URINE: PREG TEST UR: POSITIVE — AB

## 2017-03-14 LAB — WET PREP, GENITAL
SPERM: NONE SEEN
TRICH WET PREP: NONE SEEN
YEAST WET PREP: NONE SEEN

## 2017-03-14 NOTE — Discharge Instructions (Signed)
Threatened Miscarriage °A threatened miscarriage occurs when you have vaginal bleeding during your first 20 weeks of pregnancy but the pregnancy has not ended. If you have vaginal bleeding during this time, your health care provider will do tests to make sure you are still pregnant. If the tests show you are still pregnant and the developing baby (fetus) inside your womb (uterus) is still growing, your condition is considered a threatened miscarriage. °A threatened miscarriage does not mean your pregnancy will end, but it does increase the risk of losing your pregnancy (complete miscarriage). °What are the causes? °The cause of a threatened miscarriage is usually not known. If you go on to have a complete miscarriage, the most common cause is an abnormal number of chromosomes in the developing baby. Chromosomes are the structures inside cells that hold all your genetic material. °Some causes of vaginal bleeding that do not result in miscarriage include: °· Having sex. °· Having an infection. °· Normal hormone changes of pregnancy. °· Bleeding that occurs when an egg implants in your uterus. ° °What increases the risk? °Risk factors for bleeding in early pregnancy include: °· Obesity. °· Smoking. °· Drinking excessive amounts of alcohol or caffeine. °· Recreational drug use. ° °What are the signs or symptoms? °· Light vaginal bleeding. °· Mild abdominal pain or cramps. °How is this diagnosed? °If you have bleeding with or without abdominal pain before 20 weeks of pregnancy, your health care provider will do tests to check whether you are still pregnant. One important test involves using sound waves and a computer (ultrasound) to create images of the inside of your uterus. Other tests include an internal exam of your vagina and uterus (pelvic exam) and measurement of your baby’s heart rate. °You may be diagnosed with a threatened miscarriage if: °· Ultrasound testing shows you are still pregnant. °· Your baby’s heart  rate is strong. °· A pelvic exam shows that the opening between your uterus and your vagina (cervix) is closed. °· Your heart rate and blood pressure are stable. °· Blood tests confirm you are still pregnant. ° °How is this treated? °No treatments have been shown to prevent a threatened miscarriage from going on to a complete miscarriage. However, the right home care is important. °Follow these instructions at home: °· Make sure you keep all your appointments for prenatal care. This is very important. °· Get plenty of rest. °· Do not have sex or use tampons if you have vaginal bleeding. °· Do not douche. °· Do not smoke or use recreational drugs. °· Do not drink alcohol. °· Avoid caffeine. °Contact a health care provider if: °· You have light vaginal bleeding or spotting while pregnant. °· You have abdominal pain or cramping. °· You have a fever. °Get help right away if: °· You have heavy vaginal bleeding. °· You have blood clots coming from your vagina. °· You have severe low back pain or abdominal cramps. °· You have fever, chills, and severe abdominal pain. °This information is not intended to replace advice given to you by your health care provider. Make sure you discuss any questions you have with your health care provider. °Document Released: 01/17/2005 Document Revised: 06/25/2015 Document Reviewed: 11/13/2012 °Elsevier Interactive Patient Education © 2018 Elsevier Inc. ° °

## 2017-03-14 NOTE — MAU Note (Signed)
Urine is in the Lab 

## 2017-03-14 NOTE — MAU Provider Note (Signed)
History     CSN: [id number]  Arrival date and time: 03/14/17 4098   First Provider Initiated Contact with Patient 03/14/17 2032      Chief Complaint  Patient presents with  . Vaginal Bleeding   HPI [name] is a 37 y.o. [id number] at [redacted]w[redacted]d by LMP who presents with vaginal bleeding. Symptoms began this morning. Reports initially was pink spotting this morning but the bleeding has darkened throughout the day. Is not saturating pads or passing clots. Endorses lower abdominal cramping that she rates 1/10. Has not treated symptoms. Had intercourse on Saturdays. Denies n/v/d, dysuria, or vaginal discharge.   OB History    Gravida Para Term Preterm AB Living   8 4 4  0 3 3   SAB TAB Ectopic Multiple Live Births   2 1 0 0 4      Past Medical History:  Diagnosis Date  . Depression with anxiety 07/04/2013  . Heart murmur   . Morbid obesity (HCC) 03/21/2013  . 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 BREAST SURGERY  2005   L breast infection after loss  . CESAREAN SECTION  02/24/2011   Procedure: CESAREAN SECTION;  Surgeon: Brock Bad, MD;  Location: [facility];  Service: Gynecology;  Laterality: N/A;  Primary, cord ph 7.29    Family History  Problem Relation Age of Onset  . Diabetes Maternal Aunt   . Breast cancer Maternal Aunt        2 maternal aunts with breast cancer diagnosed at 65 and 35 yo  . Cancer Maternal Grandfather        lung  . Colon cancer Maternal Grandfather        died in his 58's.     Social History   Tobacco Use  . Smoking status: Never Smoker  . Smokeless tobacco: Never Used  Substance Use Topics  . Alcohol use: No    Frequency: Never    Comment: occasional, social drinking  . Drug use: No    Allergies:  Allergies  Allergen Reactions  . Codeine Nausea And Vomiting    Per patient, "I cannot take percocet."    Medications Prior to Admission   Medication Sig Dispense Refill Last Dose  . amLODipine (NORVASC) 5 MG tablet Take 1 tablet (5 mg total) by mouth daily. 30 tablet 1   . norethindrone (MICRONOR,CAMILA,ERRIN) 0.35 MG tablet Take 1 tablet (0.35 mg total) by mouth daily. 1 Package 11     Review of Systems  Constitutional: Negative.   Gastrointestinal: Positive for abdominal pain. Negative for diarrhea, nausea and vomiting.  Genitourinary: Positive for vaginal bleeding. Negative for dysuria and vaginal discharge.   Physical Exam   Blood pressure (!) 147/83, pulse 73, temperature 97.9 F (36.6 C), temperature source Oral, resp. rate 20, height 5\' 6"  (1.676 m), weight 252 lb 4 oz (114.4 kg), last menstrual period 02/02/2017, currently breastfeeding.  Physical Exam  Nursing note and vitals reviewed. Constitutional: She is oriented to person, place, and time. She appears well-developed and well-nourished. No distress.  HENT:  Head: Normocephalic and atraumatic.  Eyes: Conjunctivae are normal. Right eye exhibits no discharge. Left eye exhibits no discharge. No scleral icterus.  Neck: Normal range of motion.  Respiratory: Effort normal. No respiratory distress.  GI: Soft. She exhibits no distension. There is no tenderness.  Genitourinary: Cervix exhibits no motion tenderness and no friability.  There is bleeding (small amount of dark red mucoid blood. Cervix closed) in the vagina.  Neurological: She is alert and oriented to person, place, and time.  Skin: Skin is warm and dry. She is not diaphoretic.  Psychiatric: She has a normal mood and affect. Her behavior is normal. Judgment and thought content normal.    MAU Course  Procedures Results for orders placed or performed during the hospital encounter of 03/14/17 (from the past 24 hour(s))  Pregnancy, urine POC     Status: Abnormal   Collection Time: 03/14/17  7:16 PM  Result Value Ref Range   Preg Test, Ur POSITIVE (A) NEGATIVE  CBC     Status: Abnormal   Collection  Time: 03/14/17  8:40 PM  Result Value Ref Range   WBC 7.4 4.0 - 10.5 K/uL   RBC 4.68 3.87 - 5.11 MIL/uL   Hemoglobin 11.8 (L) 12.0 - 15.0 g/dL   HCT 16.135.2 (L) 09.636.0 - 04.546.0 %   MCV 75.2 (L) 78.0 - 100.0 fL   MCH 25.2 (L) 26.0 - 34.0 pg   MCHC 33.5 30.0 - 36.0 g/dL   RDW 40.913.9 81.111.5 - 91.415.5 %   Platelets 252 150 - 400 K/uL  hCG, quantitative, pregnancy     Status: Abnormal   Collection Time: 03/14/17  8:40 PM  Result Value Ref Range   hCG, Beta Chain, Quant, S 5,435 (H) <5 mIU/mL  Wet prep, genital     Status: Abnormal   Collection Time: 03/14/17  8:47 PM  Result Value Ref Range   Yeast Wet Prep HPF POC NONE SEEN NONE SEEN   Trich, Wet Prep NONE SEEN NONE SEEN   Clue Cells Wet Prep HPF POC PRESENT (A) NONE SEEN   WBC, Wet Prep HPF POC FEW (A) NONE SEEN   Sperm NONE SEEN    [place] Ob Less Than 14 Weeks With Ob Transvaginal  Result Date: 03/14/2017 CLINICAL DATA:  Vaginal bleeding, first trimester pregnancy EXAM: OBSTETRIC <14 WK US AND TRANSVAGINAL OB US TECHNIQUE: Both transabdominal and transvaginal ultrasound examinations were performed for complete evaluation of the gestation as well as the maternal uterus, adnexal regions, and pelvic cul-de-sac. Transvaginal technique was performed to assess early pregnancy. COMPARISON:  None. FINDINGS: Intrauterine gestational sac: Present Yolk sac:  Present Embryo:  Absent Cardiac Activity: N/A Heart Rate: N/A  bpm MSD: 8 mm   5 w   4 d Subchorionic hemorrhage:  None visualized. Maternal uterus/adnexae: Retroverted uterus, not considered abnormal in the first trimester. Both ovaries appear normal. No free pelvic fluid. IMPRESSION: 1. Early intrauterine pregnancy with a gestational sac and yolk sac seen, but with the fetal pole not currently visualized. Gestational sac diameter compatible with 5 weeks 4 days pregnancy. Overall this probably represents early pregnancy. Given the vaginal bleeding, it may be prudent to follow up with ultrasound in 14 days to confirm  continued viability and progression of embryo. Electronically Signed   By: Gaylyn RongWalter  Liebkemann M.D.   On: 03/14/2017 21:38     MDM +UPT UA, wet prep, GC/chlamydia, CBC, ABO/Rh, quant hCG, HIV, and US today to rule out ectopic pregnancy B positive Ultrasound shows IUGS with yolk sac Discussed results with patient. Confirmed IUP & ruled out ectopic. Can't exclude miscarriage. Will order outpatient viability scan in 2 weeks. Pelvic rest until then.   Assessment and Plan  A; 1. Threatened miscarriage   2. Vaginal bleeding in pregnancy, first trimester   3. Less than [redacted] weeks gestation of pregnancy  P: Discharge home Viability u/s in 2 weeks ordered Bleeding precautions Pelvic rest GC/CT pending  Judeth Horn 03/14/2017, 8:32 PM

## 2017-03-14 NOTE — MAU Note (Signed)
End of shift report given to [facility], Charity fundraiserN.

## 2017-03-14 NOTE — MAU Note (Signed)
PT  SAYS SHE DID HPT  LAST  Monday  - POSITIVE . LMP- 02-02-2017   . SPOTTING - PINK- IN UNDERWEAR  AND CLOTHES.    IN TRIAGE - NONE  ON TOILET PAPER.   LAST SEX-  SAT. NO APPOINTMENT

## 2017-03-15 LAB — GC/CHLAMYDIA PROBE AMP (~~LOC~~) NOT AT ARMC
CHLAMYDIA, DNA PROBE: NEGATIVE
NEISSERIA GONORRHEA: NEGATIVE

## 2017-03-19 ENCOUNTER — Inpatient Hospital Stay (HOSPITAL_COMMUNITY): Payer: Medicaid Other

## 2017-03-19 ENCOUNTER — Encounter (HOSPITAL_COMMUNITY): Payer: Self-pay

## 2017-03-19 ENCOUNTER — Inpatient Hospital Stay (HOSPITAL_COMMUNITY)
Admission: AD | Admit: 2017-03-19 | Discharge: 2017-03-19 | Disposition: A | Discharge status: Home / Self Care | Payer: Medicaid Other | Source: Ambulatory Visit | Attending: Family Medicine | Admitting: Family Medicine

## 2017-03-19 DIAGNOSIS — O209 Hemorrhage in early pregnancy, unspecified: Secondary | ICD-10-CM | POA: Diagnosis present

## 2017-03-19 DIAGNOSIS — O09891 Supervision of other high risk pregnancies, first trimester: Secondary | ICD-10-CM | POA: Insufficient documentation

## 2017-03-19 DIAGNOSIS — O99211 Obesity complicating pregnancy, first trimester: Secondary | ICD-10-CM | POA: Insufficient documentation

## 2017-03-19 DIAGNOSIS — Z885 Allergy status to narcotic agent status: Secondary | ICD-10-CM | POA: Diagnosis not present

## 2017-03-19 DIAGNOSIS — O26891 Other specified pregnancy related conditions, first trimester: Secondary | ICD-10-CM | POA: Diagnosis present

## 2017-03-19 DIAGNOSIS — R109 Unspecified abdominal pain: Secondary | ICD-10-CM | POA: Insufficient documentation

## 2017-03-19 DIAGNOSIS — Z3A01 Less than 8 weeks gestation of pregnancy: Secondary | ICD-10-CM | POA: Insufficient documentation

## 2017-03-19 DIAGNOSIS — F418 Other specified anxiety disorders: Secondary | ICD-10-CM | POA: Insufficient documentation

## 2017-03-19 DIAGNOSIS — O99341 Other mental disorders complicating pregnancy, first trimester: Secondary | ICD-10-CM | POA: Insufficient documentation

## 2017-03-19 DIAGNOSIS — O039 Complete or unspecified spontaneous abortion without complication: Secondary | ICD-10-CM

## 2017-03-19 DIAGNOSIS — O4690 Antepartum hemorrhage, unspecified, unspecified trimester: Secondary | ICD-10-CM

## 2017-03-19 DIAGNOSIS — F53 Postpartum depression: Secondary | ICD-10-CM | POA: Diagnosis not present

## 2017-03-19 LAB — URINALYSIS, ROUTINE W REFLEX MICROSCOPIC
BACTERIA UA: NONE SEEN
Bilirubin Urine: NEGATIVE
Glucose, UA: NEGATIVE mg/dL
KETONES UR: NEGATIVE mg/dL
Leukocytes, UA: NEGATIVE
Nitrite: NEGATIVE
PROTEIN: NEGATIVE mg/dL
Specific Gravity, Urine: 1.016 (ref 1.005–1.030)
pH: 5 (ref 5.0–8.0)

## 2017-03-19 LAB — CBC
HCT: 33 % — ABNORMAL LOW (ref 36.0–46.0)
Hemoglobin: 10.9 g/dL — ABNORMAL LOW (ref 12.0–15.0)
MCH: 25.2 pg — ABNORMAL LOW (ref 26.0–34.0)
MCHC: 33 g/dL (ref 30.0–36.0)
MCV: 76.2 fL — ABNORMAL LOW (ref 78.0–100.0)
Platelets: 258 10*3/uL (ref 150–400)
RBC: 4.33 MIL/uL (ref 3.87–5.11)
RDW: 14.1 % (ref 11.5–15.5)
WBC: 9 10*3/uL (ref 4.0–10.5)

## 2017-03-19 LAB — HCG, QUANTITATIVE, PREGNANCY: HCG, BETA CHAIN, QUANT, S: 103 m[IU]/mL — AB (ref <5)

## 2017-03-19 MED ORDER — IBUPROFEN 600 MG PO TABS: 600.0000 mg | ORAL_TABLET | Freq: Four times a day (QID) | ORAL | 0 refills | Status: DC | PRN

## 2017-03-19 NOTE — MAU Note (Signed)
Pt had some bleeding last week and it has continued since. The night she left she passed a clot and has still had bleeding. Now has some abdominal pain 2/10. She says she is also feeling some numbness in her left leg and shoulder.

## 2017-03-19 NOTE — MAU Provider Note (Signed)
History     CSN: [id number]  Arrival date and time: 03/19/17 1757   First Provider Initiated Contact with Patient 03/19/17 1831      Chief Complaint  Patient presents with  . Abdominal Pain  . Vaginal Bleeding   [id number] @[redacted]w[redacted]d  here with VB. Bleeding started 5 days ago. She was seen in MAU that day and US showed IUGS and YS but no FP. When she left MAU she began to bleeding heavier and started passing gold ball sized clots. Bleeding continued and was heavy as a period. Bleeding became lighter today. Associated sx are mild cramping.    OB History    Gravida Para Term Preterm AB Living   8 4 4  0 3 3   SAB TAB Ectopic Multiple Live Births   2 1 0 0 4      Past Medical History:  Diagnosis Date  . Depression with anxiety 07/04/2013  . Heart murmur   . Morbid obesity (HCC) 03/21/2013  . 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 BREAST SURGERY  2005   L breast infection after loss  . CESAREAN SECTION  02/24/2011   Procedure: CESAREAN SECTION;  Surgeon: Brock Bad, MD;  Location: [facility];  Service: Gynecology;  Laterality: N/A;  Primary, cord ph 7.29    Family History  Problem Relation Age of Onset  . Diabetes Maternal Aunt   . Breast cancer Maternal Aunt        2 maternal aunts with breast cancer diagnosed at 54 and 35 yo  . Cancer Maternal Grandfather        lung  . Colon cancer Maternal Grandfather        died in his 48's.     Social History   Tobacco Use  . Smoking status: Never Smoker  . Smokeless tobacco: Never Used  Substance Use Topics  . Alcohol use: No    Frequency: Never    Comment: occasional, social drinking  . Drug use: No    Allergies:  Allergies  Allergen Reactions  . Codeine Nausea And Vomiting    Per patient, "I cannot take percocet."    No medications prior to admission.    Review of Systems  Gastrointestinal: Positive for abdominal pain.   Genitourinary: Positive for vaginal bleeding.   Physical Exam   Blood pressure 134/83, pulse 74, temperature 98 F (36.7 C), resp. rate 16, weight 252 lb (114.3 kg), last menstrual period 02/02/2017, SpO2 100 %, currently breastfeeding.  Physical Exam  Nursing note and vitals reviewed. Constitutional: She is oriented to person, place, and time. She appears well-developed and well-nourished. No distress.  HENT:  Head: Normocephalic and atraumatic.  Neck: Normal range of motion.  Respiratory: Effort normal. No respiratory distress.  GI: Soft. She exhibits no distension and no mass. There is no tenderness. There is no rebound and no guarding.  Genitourinary:  Genitourinary Comments: External: no lesions or erythema Vagina: rugated, pink, moist, scant bloody discharge, cervix closed Uterus: non enlarged, anteverted, non tender, no CMT Adnexae: no masses, no tenderness left, no tenderness right   Musculoskeletal: Normal range of motion.  Neurological: She is alert and oriented to person, place, and time.  Skin: Skin is warm and dry.  Psychiatric: She has a normal mood and affect.   Results for orders placed or performed during the hospital encounter of 03/19/17 (from the past 24 hour(s))  Urinalysis, Routine w reflex microscopic     Status: Abnormal   Collection Time: 03/19/17  6:10 PM  Result Value Ref Range   Color, Urine YELLOW YELLOW   APPearance CLEAR CLEAR   Specific Gravity, Urine 1.016 1.005 - 1.030   pH 5.0 5.0 - 8.0   Glucose, UA NEGATIVE NEGATIVE mg/dL   Hgb urine dipstick MODERATE (A) NEGATIVE   Bilirubin Urine NEGATIVE NEGATIVE   Ketones, ur NEGATIVE NEGATIVE mg/dL   Protein, ur NEGATIVE NEGATIVE mg/dL   Nitrite NEGATIVE NEGATIVE   Leukocytes, UA NEGATIVE NEGATIVE   RBC / HPF 0-5 0 - 5 RBC/hpf   WBC, UA 0-5 0 - 5 WBC/hpf   Bacteria, UA NONE SEEN NONE SEEN   Squamous Epithelial / LPF 0-5 (A) NONE SEEN   Mucus PRESENT   CBC     Status: Abnormal   Collection  Time: 03/19/17  6:59 PM  Result Value Ref Range   WBC 9.0 4.0 - 10.5 K/uL   RBC 4.33 3.87 - 5.11 MIL/uL   Hemoglobin 10.9 (L) 12.0 - 15.0 g/dL   HCT 81.133.0 (L) 91.436.0 - 78.246.0 %   MCV 76.2 (L) 78.0 - 100.0 fL   MCH 25.2 (L) 26.0 - 34.0 pg   MCHC 33.0 30.0 - 36.0 g/dL   RDW 95.614.1 21.311.5 - 08.615.5 %   Platelets 258 150 - 400 K/uL  hCG, quantitative, pregnancy     Status: Abnormal   Collection Time: 03/19/17  6:59 PM  Result Value Ref Range   hCG, Beta Chain, Quant, S 103 (H) <5 mIU/mL   [place] Ob Transvaginal  Result Date: 03/19/2017 CLINICAL DATA:  Bleeding since February 12. Beta HCG on February 12 measured 5,435. Estimated gestational age by LMP is 6 weeks and 3 days. EXAM: TRANSVAGINAL OB ULTRASOUND TECHNIQUE: Transvaginal ultrasound was performed for complete evaluation of the gestation as well as the maternal uterus, adnexal regions, and pelvic cul-de-sac. COMPARISON:  None. FINDINGS: Intrauterine gestational sac: None seen Maternal uterus/adnexae: Endometrial complex is somewhat heterogeneous in appearance, measuring 7 mm thickness. No defined mass or fluid collection within the endometrial canal. Both ovaries appear normal. No mass or free fluid identified within either adnexal region. No mass or free fluid identified within the cul-de-sac. IMPRESSION: Normal pelvic ultrasound. No intrauterine pregnancy identified. No evidence of ectopic pregnancy identified. If today's beta HCG level is increased compared to the previous number of 5,435, occult/early ectopic pregnancy would certainly not be excluded. Electronically Signed   By: Bary RichardStan  Maynard M.D.   On: 03/19/2017 20:26   MAU Course  Procedures  MDM Labs and US ordered and reviewed. Quant dropped and US indicates SAB. Discussed findings with pt, support given. Message sent to admin pool for f/u appts. Stable for discharge home.  Assessment and Plan  SAB Rh pos  Discharge home Follow up in [facility] in 1 week for labs and 2 weeks for visit Rx  Ibuprofen Return precautions  Allergies as of 03/19/2017      Reactions   Codeine Nausea And Vomiting   Per patient, "I cannot take percocet."      Medication List    TAKE these medications   amLODipine 5 MG tablet Commonly known as:  NORVASC Take 1 tablet (5 mg total) by mouth daily.   ibuprofen 600 MG tablet Commonly known as:  ADVIL,MOTRIN Take 1 tablet (600 mg total) by mouth every 6 (six) hours as needed.      [name], CNM 03/19/2017, 9:08 PM

## 2017-03-19 NOTE — Discharge Instructions (Signed)

## 2017-03-28 ENCOUNTER — Other Ambulatory Visit: Payer: Medicaid Other

## 2017-03-28 DIAGNOSIS — O039 Complete or unspecified spontaneous abortion without complication: Secondary | ICD-10-CM

## 2017-03-29 LAB — BETA HCG QUANT (REF LAB): HCG QUANT: 5 m[IU]/mL

## 2017-04-04 ENCOUNTER — Ambulatory Visit: Payer: Medicaid Other | Admitting: Obstetrics & Gynecology

## 2017-09-26 ENCOUNTER — Ambulatory Visit (INDEPENDENT_AMBULATORY_CARE_PROVIDER_SITE_OTHER): Payer: Self-pay | Admitting: Family Medicine

## 2017-09-26 ENCOUNTER — Encounter: Payer: Self-pay | Admitting: Family Medicine

## 2017-09-26 DIAGNOSIS — Z3481 Encounter for supervision of other normal pregnancy, first trimester: Secondary | ICD-10-CM

## 2017-09-26 DIAGNOSIS — Z3201 Encounter for pregnancy test, result positive: Secondary | ICD-10-CM

## 2017-09-26 DIAGNOSIS — N926 Irregular menstruation, unspecified: Secondary | ICD-10-CM

## 2017-09-26 LAB — POCT URINE PREGNANCY: Preg Test, Ur: POSITIVE — AB

## 2017-09-26 NOTE — Assessment & Plan Note (Signed)
Currently 5 weeks 6 days based on LMP.  High risk pregnancy.  Minimal symptoms primarily with nausea and headaches well-controlled with Tylenol.  Desires pregnancy. - Ambulatory referral to OB/GYN for high risk pregnancy - RTC in 4 weeks for initial prenatal visit, OB panel along with sickle cell screen places Future order - Advised patient to begin prenatal vitamin today

## 2017-09-26 NOTE — Patient Instructions (Signed)
Thank you for coming in to see us today. Please see below to review our plan for today's visit.  Congratulations on your pregnancy!  We will schedule you to follow-up with our clinic in 4 weeks for your initial prenatal visit.  I have placed orders so they can collect labs during this visit.  You do qualify for being "high risk" meaning you need extra attention with an obstetrician.  I have placed a referral in their office should contact you in the next few weeks to schedule the next appointment.  Feel free to cancel your initial OB appointment in 4 weeks if you are able to get in before then.  Make sure you take a prenatal vitamin daily starting today.  Please call the clinic at [phone] if your symptoms worsen or you have any concerns. It was our pleasure to serve you.  Durward Parcelavid McMullen, DO [facility], PGY-3

## 2017-09-26 NOTE — Progress Notes (Signed)
   Subjective   Patient ID: [name]    DOB: 09-06-1980, 37 y.o. female   MRN: [id number]  CC: "I think I am pregnant"  HPI: [name] is a 37 y.o. female who presents for a same day appointment for the following:  Missed period: Patient presenting today due to patient for pregnancy.  LMP July 17.  She reports regular periods lasting 28 days with 5 days of bleeding.  She has not tried an OTC pregnancy test.  She does report nausea without vomiting, breast tenderness, occasional tension headaches.  She is taking Tylenol for headaches with adequate relief.  She is not currently on a prenatal vitamin.  She does have a history of 4 abortions in her first trimester with her last being earlier in February.  She desires to proceed with this pregnancy.  This is an unplanned pregnancy but she is excited about the possibility of having another child.  She is worried about the possibility of having a repeat abortion.  ROS: see HPI for pertinent.  PMFSH: Obesity, depression with anxiety history of gestational hypertension.  History C-section, L-breast surgery following infection.  History unremarkable.  Smoking status reviewed. Medications reviewed.  Objective   LMP 02/02/2017  Vitals and nursing note reviewed.  General: well nourished, well developed, NAD with non-toxic appearance HEENT: normocephalic, atraumatic, moist mucous membranes Neck: supple, non-tender without lymphadenopathy Cardiovascular: regular rate and rhythm with systolic murmur, no rubs or gallops Lungs: clear to auscultation bilaterally with normal work of breathing Abdomen: soft, non-tender, non-distended, normoactive bowel sounds Skin: warm, dry, no rashes or lesions, cap refill < 2 seconds Extremities: warm and well perfused, normal tone, no edema  Assessment & Plan   Supervision of normal pregnancy Currently 5 weeks 6 days based on LMP.  High risk pregnancy.  Minimal symptoms primarily with nausea and headaches  well-controlled with Tylenol.  Desires pregnancy. - Ambulatory referral to OB/GYN for high risk pregnancy - RTC in 4 weeks for initial prenatal visit, OB panel along with sickle cell screen places Future order - Advised patient to begin prenatal vitamin today  Orders Placed This Encounter  Procedures  . Obstetric Panel, Including HIV    Standing Status:   Future    Standing Expiration Date:   10/27/2017  . Sickle cell screen    Standing Status:   Future    Standing Expiration Date:   10/27/2017  . Ambulatory referral to Obstetrics / Gynecology    Referral Priority:   Routine    Referral Type:   Consultation    Referral Reason:   Specialty Services Required    Requested Specialty:   Obstetrics and Gynecology    Number of Visits Requested:   1  . POCT urine pregnancy   No orders of the defined types were placed in this encounter.   Erika Parcelavid [name], DO [facility], PGY-3 09/26/2017, 5:21 PM

## 2017-10-13 ENCOUNTER — Encounter: Payer: Medicaid Other | Admitting: Obstetrics & Gynecology

## 2017-10-17 ENCOUNTER — Other Ambulatory Visit: Payer: Self-pay

## 2017-10-17 ENCOUNTER — Ambulatory Visit (INDEPENDENT_AMBULATORY_CARE_PROVIDER_SITE_OTHER): Payer: Self-pay | Admitting: Family Medicine

## 2017-10-17 VITALS — BP 125/80 | HR 90 | Temp 98.6°F | Wt 282.0 lb

## 2017-10-17 DIAGNOSIS — Z3401 Encounter for supervision of normal first pregnancy, first trimester: Secondary | ICD-10-CM

## 2017-10-17 NOTE — Progress Notes (Addendum)
[name] is a 37 y.o. yo [phone] at 80415w5d who presents for her initial prenatal visit. Pregnancy is planned She reports none. She  is taking PNV. See flow sheet for details.  PMH, POBH, FH, meds, allergies and Social Hx reviewed.  Prenatal Exam: Gen: Well nourished, well developed.  No distress.  Vitals noted. HEENT: Normocephalic, atraumatic.  Neck supple without cervical lymphadenopathy, thyromegaly or thyroid nodules.  Fair dentition. CV: RRR no murmur, gallops or rubs Lungs: CTAB.  Normal respiratory effort without wheezes or rales. Abd: soft, NTND. +BS.  Uterus not appreciated above pelvis. GU: Normal external female genitalia without lesions.  Normal vaginal, well rugated without lesions. No vaginal discharge.  Bimanual exam: No adnexal mass or TTP. No CMT.  Uterus size  Ext: No clubbing, cyanosis or edema. Psych: Normal grooming and dress.  Not depressed or anxious appearing.  Normal thought content and process without flight of ideas or looseness of associations.  Assessment & Plan: 1) 37 y.o. yo [id number] at 50415w5d via LMP doing well.  Current pregnancy issues include none. Dating is reliable. Prenatal labs collected today. Genetic screening offered: declined. Early glucola is indicated.  PHQ-9 score 4 and Pregnancy Medical Home forms completed and reviewed. Patient left before providing urine specimen for GC/chlamydia and culture. Scheduled for early OB ultrasound and patient will follow-up later in the week for early glucose testing.  Bleeding and pain precautions reviewed. Importance of prenatal vitamins reviewed.  Follow up in 4 weeks.

## 2017-10-17 NOTE — Patient Instructions (Signed)
Thank you for coming in to see us today. Please see below to review our plan for today's visit.  Please call back the high risk OB office today and schedule an appointment.  Let them know that you were seen today.  We will see you again in 4 weeks if you are unable to get into the other office.  Please call the clinic at [phone] if your symptoms worsen or you have any concerns. It was our pleasure to serve you.  Durward Parcelavid McMullen, DO [facility], PGY-3

## 2017-10-19 LAB — OBSTETRIC PANEL, INCLUDING HIV
ANTIBODY SCREEN: NEGATIVE
BASOS: 0 %
Basophils Absolute: 0 10*3/uL (ref 0.0–0.2)
EOS (ABSOLUTE): 0.1 10*3/uL (ref 0.0–0.4)
EOS: 1 %
HEMATOCRIT: 34.6 % (ref 34.0–46.6)
HEMOGLOBIN: 11.1 g/dL (ref 11.1–15.9)
HEP B S AG: NEGATIVE
HIV Screen 4th Generation wRfx: NONREACTIVE
IMMATURE GRANS (ABS): 0 10*3/uL (ref 0.0–0.1)
IMMATURE GRANULOCYTES: 0 %
LYMPHS: 37 %
Lymphocytes Absolute: 3.3 10*3/uL — ABNORMAL HIGH (ref 0.7–3.1)
MCH: 24 pg — ABNORMAL LOW (ref 26.6–33.0)
MCHC: 32.1 g/dL (ref 31.5–35.7)
MCV: 75 fL — ABNORMAL LOW (ref 79–97)
MONOCYTES: 4 %
MONOS ABS: 0.4 10*3/uL (ref 0.1–0.9)
NEUTROS PCT: 58 %
Neutrophils Absolute: 5.2 10*3/uL (ref 1.4–7.0)
Platelets: 297 10*3/uL (ref 150–450)
RBC: 4.63 x10E6/uL (ref 3.77–5.28)
RDW: 15.3 % (ref 12.3–15.4)
RH TYPE: POSITIVE
RPR: NONREACTIVE
WBC: 9.1 10*3/uL (ref 3.4–10.8)

## 2017-10-23 ENCOUNTER — Telehealth: Payer: Self-pay

## 2017-10-23 NOTE — Telephone Encounter (Signed)
Called patient and left message with husband for her to call back concerning her [facility] with a show time of 0945. Patient must drink 32 ounces of water 1 hour prior to appointment.  When patient calls back please inform her of appointment time and instructions.  Thank you  .Glennie HawkSimpson, Shyana Kulakowski R, CMA

## 2017-10-24 ENCOUNTER — Encounter: Payer: Medicaid Other | Admitting: Family Medicine

## 2017-10-24 NOTE — Telephone Encounter (Signed)
Pt has been notified. [name], CMA

## 2017-10-27 ENCOUNTER — Ambulatory Visit (HOSPITAL_COMMUNITY)
Admission: RE | Admit: 2017-10-27 | Discharge: 2017-10-27 | Disposition: A | Discharge status: Home / Self Care | Payer: Medicaid Other | Source: Ambulatory Visit | Attending: Family Medicine | Admitting: Family Medicine

## 2017-10-27 DIAGNOSIS — Z3401 Encounter for supervision of normal first pregnancy, first trimester: Secondary | ICD-10-CM | POA: Insufficient documentation

## 2017-11-15 ENCOUNTER — Other Ambulatory Visit (HOSPITAL_COMMUNITY)
Admission: RE | Admit: 2017-11-15 | Discharge: 2017-11-15 | Disposition: A | Discharge status: Home / Self Care | Payer: Medicaid Other | Source: Ambulatory Visit | Attending: Obstetrics and Gynecology | Admitting: Obstetrics and Gynecology

## 2017-11-15 ENCOUNTER — Ambulatory Visit (INDEPENDENT_AMBULATORY_CARE_PROVIDER_SITE_OTHER): Payer: Medicaid Other | Admitting: Obstetrics and Gynecology

## 2017-11-15 ENCOUNTER — Encounter: Payer: Self-pay | Admitting: Obstetrics and Gynecology

## 2017-11-15 VITALS — BP 126/70 | HR 101 | Wt 294.0 lb

## 2017-11-15 DIAGNOSIS — Z3A12 12 weeks gestation of pregnancy: Secondary | ICD-10-CM | POA: Insufficient documentation

## 2017-11-15 DIAGNOSIS — Z8759 Personal history of other complications of pregnancy, childbirth and the puerperium: Secondary | ICD-10-CM | POA: Insufficient documentation

## 2017-11-15 DIAGNOSIS — O0991 Supervision of high risk pregnancy, unspecified, first trimester: Secondary | ICD-10-CM | POA: Insufficient documentation

## 2017-11-15 DIAGNOSIS — Z98891 History of uterine scar from previous surgery: Secondary | ICD-10-CM | POA: Insufficient documentation

## 2017-11-15 DIAGNOSIS — O0990 Supervision of high risk pregnancy, unspecified, unspecified trimester: Secondary | ICD-10-CM | POA: Insufficient documentation

## 2017-11-15 MED ORDER — ASPIRIN EC 81 MG PO TBEC: 81.0000 mg | DELAYED_RELEASE_TABLET | Freq: Every day | ORAL | 2 refills | Status: DC

## 2017-11-15 NOTE — Progress Notes (Signed)
Subjective:  [name] is a 37 y.o. [phone] at 27w6dbeing seen today for ongoing prenatal care. [facility] and confirmed by first trimester U/S.  She is currently monitored for the following issues for this high-risk pregnancy and has Depression with anxiety; BMI 45.0-49.9, [place]; Contraception; Supervision of high risk pregnancy, antepartum; History of gestational hypertension; History of cesarean section; and History of VBAC on their problem list.  Patient reports no complaints.  Contractions: Not present. Vag. Bleeding: None.   . Denies leaking of fluid.   The following portions of the patient's history were reviewed and updated as appropriate: allergies, current medications, past family history, past medical history, past social history, past surgical history and problem list. Problem list updated.  Objective:   Vitals:   11/15/17 1014  BP: 126/70  Pulse: (!) 101  Weight: 294 lb (133.4 kg)    Fetal Status: Fetal Heart Rate (bpm): 160         General:  Alert, oriented and cooperative. Patient is in no acute distress.  Skin: Skin is warm and dry. No rash noted.   Cardiovascular: Normal heart rate noted  Respiratory: Normal respiratory effort, no problems with respiration noted  Abdomen: Soft, gravid, appropriate for gestational age. Pain/Pressure: Absent     Pelvic:  Cervical exam deferred        Extremities: Normal range of motion.     Mental Status: Normal mood and affect. Normal behavior. Normal judgment and thought content.   Urinalysis:      Assessment and Plan:  Pregnancy: [id number] 183w6d1. Supervision of high risk pregnancy, antepartum Prenatal care and labs reviewed with pt. Genetic testing discussed - Culture, OB Urine - Hemoglobinopathy evaluation - SMN1 Copy Number Analysis - Cystic Fibrosis Mutation 97 - Genetic Screening - Enroll Patient in Babyscripts - Comp Met (CMET) - Protein / creatinine ratio, urine -  Urine cytology ancillary only([place]) - TSH - Hemoglobin A1c  2. History of gestational hypertension Additional labs as noted above - aspirin EC 81 MG tablet; Take 1 tablet (81 mg total) by mouth daily. Take after 12 weeks for prevention of preeclampsia later in pregnancy  Dispense: 300 tablet; Refill: 2  3. History of cesarean section Successful VBAC last preg  4. History of VBAC   Preterm labor symptoms and general obstetric precautions including but not limited to vaginal bleeding, contractions, leaking of fluid and fetal movement were reviewed in detail with the patient. Please refer to After Visit Summary for other counseling recommendations.  Return in about 4 weeks (around 12/13/2017) for OB visit.   ErChancy MilroyMD

## 2017-11-15 NOTE — Patient Instructions (Signed)

## 2017-11-16 LAB — PROTEIN / CREATININE RATIO, URINE
Creatinine, Urine: 98.1 mg/dL
Protein, Ur: 6 mg/dL
Protein/Creat Ratio: 61 mg/g creat (ref 0–200)

## 2017-11-16 LAB — URINE CYTOLOGY ANCILLARY ONLY
Chlamydia: NEGATIVE
Neisseria Gonorrhea: NEGATIVE

## 2017-11-18 LAB — URINE CULTURE, OB REFLEX

## 2017-11-18 LAB — CULTURE, OB URINE

## 2017-11-24 LAB — HEMOGLOBINOPATHY EVALUATION
HEMOGLOBIN A2 QUANTITATION: 2 % (ref 1.8–3.2)
HGB C: 0 %
HGB S: 0 %
HGB VARIANT: 0 %
Hemoglobin F Quantitation: 0 % (ref 0.0–2.0)
Hgb A: 98 % (ref 96.4–98.8)

## 2017-11-24 LAB — COMPREHENSIVE METABOLIC PANEL
ALK PHOS: 84 IU/L (ref 39–117)
ALT: 16 IU/L (ref 0–32)
AST: 14 IU/L (ref 0–40)
Albumin/Globulin Ratio: 1.4 (ref 1.2–2.2)
Albumin: 4 g/dL (ref 3.5–5.5)
BUN/Creatinine Ratio: 11 (ref 9–23)
BUN: 8 mg/dL (ref 6–20)
Bilirubin Total: 0.3 mg/dL (ref 0.0–1.2)
CALCIUM: 9.5 mg/dL (ref 8.7–10.2)
CO2: 19 mmol/L — AB (ref 20–29)
CREATININE: 0.75 mg/dL (ref 0.57–1.00)
Chloride: 103 mmol/L (ref 96–106)
GFR calc Af Amer: 120 mL/min/{1.73_m2} (ref >59)
GFR calc non Af Amer: 104 mL/min/{1.73_m2} (ref >59)
GLUCOSE: 100 mg/dL — AB (ref 65–99)
Globulin, Total: 2.9 g/dL (ref 1.5–4.5)
Potassium: 3.9 mmol/L (ref 3.5–5.2)
Sodium: 138 mmol/L (ref 134–144)
Total Protein: 6.9 g/dL (ref 6.0–8.5)

## 2017-11-24 LAB — SMN1 COPY NUMBER ANALYSIS (SMA CARRIER SCREENING)

## 2017-11-24 LAB — CYSTIC FIBROSIS MUTATION 97: GENE DIS ANAL CARRIER INTERP BLD/T-IMP: NOT DETECTED

## 2017-11-24 LAB — TSH: TSH: 2.05 u[IU]/mL (ref 0.450–4.500)

## 2017-11-24 LAB — HEMOGLOBIN A1C
ESTIMATED AVERAGE GLUCOSE: 108 mg/dL
Hgb A1c MFr Bld: 5.4 % (ref 4.8–5.6)

## 2017-12-13 ENCOUNTER — Encounter: Payer: Self-pay | Admitting: Family Medicine

## 2017-12-13 ENCOUNTER — Encounter: Payer: Self-pay | Admitting: Certified Nurse Midwife

## 2017-12-13 ENCOUNTER — Ambulatory Visit (INDEPENDENT_AMBULATORY_CARE_PROVIDER_SITE_OTHER): Payer: Medicaid Other | Admitting: Certified Nurse Midwife

## 2017-12-13 VITALS — BP 122/81 | HR 85 | Wt 298.0 lb

## 2017-12-13 DIAGNOSIS — O0990 Supervision of high risk pregnancy, unspecified, unspecified trimester: Secondary | ICD-10-CM

## 2017-12-13 DIAGNOSIS — O99210 Obesity complicating pregnancy, unspecified trimester: Secondary | ICD-10-CM | POA: Insufficient documentation

## 2017-12-13 DIAGNOSIS — Z3A16 16 weeks gestation of pregnancy: Secondary | ICD-10-CM

## 2017-12-13 DIAGNOSIS — Z98891 History of uterine scar from previous surgery: Secondary | ICD-10-CM

## 2017-12-13 DIAGNOSIS — J00 Acute nasopharyngitis [common cold]: Secondary | ICD-10-CM

## 2017-12-13 NOTE — Progress Notes (Signed)
Pt c/o cold sx's

## 2017-12-13 NOTE — Progress Notes (Signed)
   PRENATAL VISIT NOTE  Subjective:  [name] is a 37 y.o. [phone] at 6222w6d being seen today for ongoing prenatal care.  She is currently monitored for the following issues for this high-risk pregnancy and has Depression with anxiety; BMI 45.0-49.9, adult (HCC); Contraception; Supervision of high risk pregnancy, antepartum; History of gestational hypertension; History of cesarean section; and History of VBAC on their problem list.  Patient reports cold symptoms, runny nose, congestion.  Contractions: Not present. Vag. Bleeding: None.  Movement: Present. Denies leaking of fluid.   The following portions of the patient's history were reviewed and updated as appropriate: allergies, current medications, past family history, past medical history, past social history, past surgical history and problem list. Problem list updated.  Objective:   Vitals:   12/13/17 0917  BP: 122/81  Pulse: 85  Weight: 298 lb (135.2 kg)    Fetal Status: Fetal Heart Rate (bpm): 140   Movement: Present     General:  Alert, oriented and cooperative. Patient is in no acute distress.  Skin: Skin is warm and dry. No rash noted.   Cardiovascular: Normal heart rate noted  Respiratory: Normal respiratory effort, no problems with respiration noted  Abdomen: Soft, gravid, appropriate for gestational age.  Pain/Pressure: Present     Pelvic: Cervical exam deferred        Extremities: Normal range of motion.  Edema: Trace  Mental Status: Normal mood and affect. Normal behavior. Normal judgment and thought content.   Assessment and Plan:  Pregnancy: [id number] at 2322w6d  1. Supervision of high risk pregnancy, antepartum - Patient doing well, cold symptoms otherwise no complaints  - Reviewed NOB labs and collected AFP for completion of genetic screening - Anticipatory guidance on upcoming appointments with anatomy US   - AFP, Serum, Open Spina Bifida - US MFM OB DETAIL +14 WK; Future  2. Common cold - Reports  congestion and runny nose, currently taking TheraFlu for symptoms- advised to stop taking TheraFlu and discussed safe medications to take while pregnant.  - List of safe medications during pregnancy given   3. History of cesarean section - Successful VBAC last pregnancy, plans another VBAC   Preterm labor symptoms and general obstetric precautions including but not limited to vaginal bleeding, contractions, leaking of fluid and fetal movement were reviewed in detail with the patient. Please refer to After Visit Summary for other counseling recommendations.  Return in about 4 weeks (around 01/10/2018) for ROB.  Future Appointments  Date Time Provider Department Center  01/01/2018 10:30 AM [facility] US 1 [facility]  01/10/2018 10:00 AM Sharyon Cableogers, Encarnacion Scioneaux C, CNM [facility] None    Sharyon CableVeronica C Kairav Russomanno, CNM

## 2017-12-13 NOTE — Patient Instructions (Addendum)

## 2017-12-15 LAB — AFP, SERUM, OPEN SPINA BIFIDA
AFP MoM: 1.48
AFP Value: 41.8 ng/mL
Gest. Age on Collection Date: 17 weeks
Maternal Age At EDD: 35.3 yr
OSBR Risk 1 IN: 5772
Test Results:: NEGATIVE
Weight: 298 [lb_av]

## 2017-12-19 ENCOUNTER — Encounter: Payer: Self-pay | Admitting: Certified Nurse Midwife

## 2017-12-25 ENCOUNTER — Encounter (HOSPITAL_COMMUNITY): Payer: Self-pay

## 2018-01-01 ENCOUNTER — Encounter (HOSPITAL_COMMUNITY): Payer: Self-pay

## 2018-01-01 ENCOUNTER — Other Ambulatory Visit (HOSPITAL_COMMUNITY): Payer: Self-pay | Admitting: *Deleted

## 2018-01-01 ENCOUNTER — Ambulatory Visit (HOSPITAL_COMMUNITY)
Admission: RE | Admit: 2018-01-01 | Discharge: 2018-01-01 | Disposition: A | Discharge status: Home / Self Care | Payer: Medicaid Other | Source: Ambulatory Visit | Attending: Certified Nurse Midwife | Admitting: Certified Nurse Midwife

## 2018-01-01 DIAGNOSIS — Z363 Encounter for antenatal screening for malformations: Secondary | ICD-10-CM | POA: Diagnosis present

## 2018-01-01 DIAGNOSIS — O34219 Maternal care for unspecified type scar from previous cesarean delivery: Secondary | ICD-10-CM | POA: Diagnosis not present

## 2018-01-01 DIAGNOSIS — O09522 Supervision of elderly multigravida, second trimester: Secondary | ICD-10-CM | POA: Diagnosis not present

## 2018-01-01 DIAGNOSIS — O34212 Maternal care for vertical scar from previous cesarean delivery: Secondary | ICD-10-CM

## 2018-01-01 DIAGNOSIS — O0990 Supervision of high risk pregnancy, unspecified, unspecified trimester: Secondary | ICD-10-CM

## 2018-01-01 DIAGNOSIS — Z3A19 19 weeks gestation of pregnancy: Secondary | ICD-10-CM | POA: Diagnosis not present

## 2018-01-01 DIAGNOSIS — Z362 Encounter for other antenatal screening follow-up: Secondary | ICD-10-CM

## 2018-01-01 DIAGNOSIS — O99212 Obesity complicating pregnancy, second trimester: Secondary | ICD-10-CM | POA: Insufficient documentation

## 2018-01-01 DIAGNOSIS — O09292 Supervision of pregnancy with other poor reproductive or obstetric history, second trimester: Secondary | ICD-10-CM | POA: Diagnosis not present

## 2018-01-01 DIAGNOSIS — O99210 Obesity complicating pregnancy, unspecified trimester: Secondary | ICD-10-CM

## 2018-01-10 ENCOUNTER — Encounter: Payer: Self-pay | Admitting: Certified Nurse Midwife

## 2018-01-10 ENCOUNTER — Ambulatory Visit (INDEPENDENT_AMBULATORY_CARE_PROVIDER_SITE_OTHER): Payer: Medicaid Other | Admitting: Certified Nurse Midwife

## 2018-01-10 VITALS — BP 120/75 | HR 78 | Wt 312.6 lb

## 2018-01-10 DIAGNOSIS — O99212 Obesity complicating pregnancy, second trimester: Secondary | ICD-10-CM

## 2018-01-10 DIAGNOSIS — O99210 Obesity complicating pregnancy, unspecified trimester: Secondary | ICD-10-CM

## 2018-01-10 DIAGNOSIS — O0990 Supervision of high risk pregnancy, unspecified, unspecified trimester: Secondary | ICD-10-CM

## 2018-01-10 DIAGNOSIS — Z98891 History of uterine scar from previous surgery: Secondary | ICD-10-CM

## 2018-01-10 DIAGNOSIS — O0992 Supervision of high risk pregnancy, unspecified, second trimester: Secondary | ICD-10-CM

## 2018-01-10 NOTE — Progress Notes (Signed)
   PRENATAL VISIT NOTE  Subjective:  [name] is a 37 y.o. [phone] at 117w6d being seen today for ongoing prenatal care.  She is currently monitored for the following issues for this high-risk pregnancy and has Depression with anxiety; Morbid obesity with body mass index of 45.0-49.9 in adult [facility](HCC); Supervision of high risk pregnancy, antepartum; History of gestational hypertension; History of cesarean section; History of VBAC; and Obesity affecting pregnancy, antepartum on their problem list.  Patient reports no complaints.  Contractions: Irregular. Vag. Bleeding: None.  Movement: Present. Denies leaking of fluid.   The following portions of the patient's history were reviewed and updated as appropriate: allergies, current medications, past family history, past medical history, past social history, past surgical history and problem list. Problem list updated.  Objective:   Vitals:   01/10/18 1004  BP: 120/75  Pulse: 78  Weight: (!) 312 lb 9.6 oz (141.8 kg)    Fetal Status: Fetal Heart Rate (bpm): 140   Movement: Present     General:  Alert, oriented and cooperative. Patient is in no acute distress.  Skin: Skin is warm and dry. No rash noted.   Cardiovascular: Normal heart rate noted  Respiratory: Normal respiratory effort, no problems with respiration noted  Abdomen: Soft, gravid, appropriate for gestational age.  Pain/Pressure: Present     Pelvic: Cervical exam deferred        Extremities: Normal range of motion.  Edema: Trace  Mental Status: Normal mood and affect. Normal behavior. Normal judgment and thought content.   Assessment and Plan:  Pregnancy: [id number] at 517w6d  1. Supervision of high risk pregnancy, antepartum - Patient doing well, no complaints  - Anticipatory guidance on upcoming appointments  2. Obesity affecting pregnancy, antepartum - Weight gain of 20lbs since NOB visit at 12 weeks, educated on recommended weight gain during pregnancy  - Discussed mild-  moderate exercise during pregnancy, patient reports walking daily  - Discussed healthy nutrition and weight gain during pregnancy   3. History of cesarean section - Plans VBAC, successful VBAC with last pregnancy    Preterm labor symptoms and general obstetric precautions including but not limited to vaginal bleeding, contractions, leaking of fluid and fetal movement were reviewed in detail with the patient. Please refer to After Visit Summary for other counseling recommendations.  Return in about 4 weeks (around 02/07/2018) for ROB.  Future Appointments  Date Time Provider Department Center  01/29/2018  9:15 AM [facility] US 4 [facility]  02/07/2018  9:15 AM Sharyon Cableogers, Anndee Connett C, CNM [facility] None    Sharyon CableVeronica C Siobhan Zaro, CNM

## 2018-01-10 NOTE — Progress Notes (Signed)
Pt presents for ROB without complaints.  

## 2018-01-10 NOTE — Patient Instructions (Signed)
Second Trimester of Pregnancy The second trimester is from week 13 through week 28, month 4 through 6. This is often the time in pregnancy that you feel your best. Often times, morning sickness has lessened or quit. You may have more energy, and you may get hungry more often. Your unborn baby (fetus) is growing rapidly. At the end of the sixth month, he or she is about 9 inches long and weighs about 1 pounds. You will likely feel the baby move (quickening) between 18 and 20 weeks of pregnancy. Follow these instructions at home:  Avoid all smoking, herbs, and alcohol. Avoid drugs not approved by your doctor.  Do not use any tobacco products, including cigarettes, chewing tobacco, and electronic cigarettes. If you need help quitting, ask your doctor. You may get counseling or other support to help you quit.  Only take medicine as told by your doctor. Some medicines are safe and some are not during pregnancy.  Exercise only as told by your doctor. Stop exercising if you start having cramps.  Eat regular, healthy meals.  Wear a good support bra if your breasts are tender.  Do not use hot tubs, steam rooms, or saunas.  Wear your seat belt when driving.  Avoid raw meat, uncooked cheese, and liter boxes and soil used by cats.  Take your prenatal vitamins.  Take 1500-2000 milligrams of calcium daily starting at the 20th week of pregnancy until you deliver your baby.  Try taking medicine that helps you poop (stool softener) as needed, and if your doctor approves. Eat more fiber by eating fresh fruit, vegetables, and whole grains. Drink enough fluids to keep your pee (urine) clear or pale yellow.  Take warm water baths (sitz baths) to soothe pain or discomfort caused by hemorrhoids. Use hemorrhoid cream if your doctor approves.  If you have puffy, bulging veins (varicose veins), wear support hose. Raise (elevate) your feet for 15 minutes, 3-4 times a day. Limit salt in your diet.  Avoid heavy  lifting, wear low heals, and sit up straight.  Rest with your legs raised if you have leg cramps or low back pain.  Visit your dentist if you have not gone during your pregnancy. Use a soft toothbrush to brush your teeth. Be gentle when you floss.  You can have sex (intercourse) unless your doctor tells you not to.  Go to your doctor visits. Get help if:  You feel dizzy.  You have mild cramps or pressure in your lower belly (abdomen).  You have a nagging pain in your belly area.  You continue to feel sick to your stomach (nauseous), throw up (vomit), or have watery poop (diarrhea).  You have bad smelling fluid coming from your vagina.  You have pain with peeing (urination). Get help right away if:  You have a fever.  You are leaking fluid from your vagina.  You have spotting or bleeding from your vagina.  You have severe belly cramping or pain.  You lose or gain weight rapidly.  You have trouble catching your breath and have chest pain.  You notice sudden or extreme puffiness (swelling) of your face, hands, ankles, feet, or legs.  You have not felt the baby move in over an hour.  You have severe headaches that do not go away with medicine.  You have vision changes. This information is not intended to replace advice given to you by your health care provider. Make sure you discuss any questions you have with your health care   provider. Document Released: 04/13/2009 Document Revised: 06/25/2015 Document Reviewed: 03/20/2012 Elsevier Interactive Patient Education  2017 Elsevier Inc.  

## 2018-01-23 ENCOUNTER — Encounter (HOSPITAL_COMMUNITY): Payer: Self-pay

## 2018-01-29 ENCOUNTER — Ambulatory Visit (HOSPITAL_COMMUNITY)
Admission: RE | Admit: 2018-01-29 | Discharge: 2018-01-29 | Disposition: A | Discharge status: Home / Self Care | Payer: Medicaid Other | Source: Ambulatory Visit | Attending: Obstetrics | Admitting: Obstetrics

## 2018-01-29 ENCOUNTER — Encounter (HOSPITAL_COMMUNITY): Payer: Self-pay

## 2018-01-29 ENCOUNTER — Other Ambulatory Visit (HOSPITAL_COMMUNITY): Payer: Self-pay | Admitting: *Deleted

## 2018-01-29 DIAGNOSIS — O99210 Obesity complicating pregnancy, unspecified trimester: Secondary | ICD-10-CM | POA: Insufficient documentation

## 2018-01-29 DIAGNOSIS — O09292 Supervision of pregnancy with other poor reproductive or obstetric history, second trimester: Secondary | ICD-10-CM

## 2018-01-29 DIAGNOSIS — O34219 Maternal care for unspecified type scar from previous cesarean delivery: Secondary | ICD-10-CM

## 2018-01-29 DIAGNOSIS — Z362 Encounter for other antenatal screening follow-up: Secondary | ICD-10-CM | POA: Diagnosis present

## 2018-01-29 DIAGNOSIS — Z3A23 23 weeks gestation of pregnancy: Secondary | ICD-10-CM

## 2018-01-29 DIAGNOSIS — O09522 Supervision of elderly multigravida, second trimester: Secondary | ICD-10-CM | POA: Diagnosis not present

## 2018-01-29 DIAGNOSIS — Z3689 Encounter for other specified antenatal screening: Secondary | ICD-10-CM

## 2018-01-29 DIAGNOSIS — O99212 Obesity complicating pregnancy, second trimester: Secondary | ICD-10-CM

## 2018-01-31 NOTE — L&D Delivery Note (Signed)
OB/[facility]N [facility] Delivery Note  [name] is a 38 y.o. [id number] s/p VBAC at [redacted]w[redacted]d. She was admitted for induction of labor for gHTN.   ROM: 1h 55m with clear fluid GBS Status: negative Maximum Maternal Temperature: Temp (48hrs), Avg:98.3 F (36.8 C), Min:97.9 F (36.6 C), Max:99.2 F (37.3 C)  Labor Progress: . Induction with FB . Started pitocin after FB out . Epidural placement and SROM  Delivery Date/Time: 05/04/18 at 0824 Delivery: Called to room and patient was complete and pushing. Head delivered OA. No nuchal cord present. Shoulder and body delivered in usual fashion. Infant with spontaneous cry, placed on mother's abdomen, dried and stimulated. Cord clamped x 2 after 1-minute delay, and cut by father of baby. Cord blood drawn. Placenta delivered spontaneously with gentle cord traction. Fundus firm with massage and Pitocin. Labia, perineum, vagina, and cervix inspected inspected with no lacerations.   Placenta: spontaneous, intact, 3-vessel cord (to be discarded) Complications: none immediate Lacerations: none EBL: 600cc (see delivery summary for QBL per [facility]) Analgesia: epidural  Postpartum Planning [x]  message to sent to schedule follow-up  [x]  vaccines UTD  Infant: Vigorous female  APGARs 8, 9  weight pending but appears AGA  Rusty Glodowski S. Earlene Plater, DO OB/GYN Fellow, Faculty Practice

## 2018-02-07 ENCOUNTER — Encounter: Payer: Self-pay | Admitting: Certified Nurse Midwife

## 2018-02-07 ENCOUNTER — Ambulatory Visit (INDEPENDENT_AMBULATORY_CARE_PROVIDER_SITE_OTHER): Payer: Medicaid Other | Admitting: Certified Nurse Midwife

## 2018-02-07 VITALS — BP 126/77 | HR 97 | Wt 321.0 lb

## 2018-02-07 DIAGNOSIS — Z8759 Personal history of other complications of pregnancy, childbirth and the puerperium: Secondary | ICD-10-CM

## 2018-02-07 DIAGNOSIS — O99210 Obesity complicating pregnancy, unspecified trimester: Secondary | ICD-10-CM

## 2018-02-07 DIAGNOSIS — Z3A24 24 weeks gestation of pregnancy: Secondary | ICD-10-CM

## 2018-02-07 DIAGNOSIS — O0990 Supervision of high risk pregnancy, unspecified, unspecified trimester: Secondary | ICD-10-CM

## 2018-02-07 DIAGNOSIS — O0992 Supervision of high risk pregnancy, unspecified, second trimester: Secondary | ICD-10-CM

## 2018-02-07 DIAGNOSIS — O99212 Obesity complicating pregnancy, second trimester: Secondary | ICD-10-CM

## 2018-02-07 DIAGNOSIS — Z98891 History of uterine scar from previous surgery: Secondary | ICD-10-CM

## 2018-02-07 NOTE — Patient Instructions (Signed)
Glucose Tolerance Test During Pregnancy Why am I having this test? The glucose tolerance test (GTT) is done to check how your body processes sugar (glucose). This is one of several tests used to diagnose diabetes that develops during pregnancy (gestational diabetes mellitus). Gestational diabetes is a temporary form of diabetes that some women develop during pregnancy. It usually occurs during the second trimester of pregnancy and goes away after delivery. Testing (screening) for gestational diabetes usually occurs between 24 and 28 weeks of pregnancy. You may have the GTT test after having a 1-hour glucose screening test if the results from that test indicate that you may have gestational diabetes. You may also have this test if:  You have a history of gestational diabetes.  You have a history of giving birth to very large babies or have experienced repeated fetal loss (stillbirth).  You have signs and symptoms of diabetes, such as: ? Changes in your vision. ? Tingling or numbness in your hands or feet. ? Changes in hunger, thirst, and urination that are not otherwise explained by your pregnancy. What is being tested? This test measures the amount of glucose in your blood at different times during a period of 3 hours. This indicates how well your body is able to process glucose. What kind of sample is taken?  Blood samples are required for this test. They are usually collected by inserting a needle into a blood vessel. How do I prepare for this test?  For 3 days before your test, eat normally. Have plenty of carbohydrate-rich foods.  Follow instructions from your health care provider about: ? Eating or drinking restrictions on the day of the test. You may be asked to not eat or drink anything other than water (fast) starting 8-10 hours before the test. ? Changing or stopping your regular medicines. Some medicines may interfere with this test. Tell a health care provider about:  All  medicines you are taking, including vitamins, herbs, eye drops, creams, and over-the-counter medicines.  Any blood disorders you have.  Any surgeries you have had.  Any medical conditions you have. What happens during the test? First, your blood glucose will be measured. This is referred to as your fasting blood glucose, since you fasted before the test. Then, you will drink a glucose solution that contains a certain amount of glucose. Your blood glucose will be measured again 1, 2, and 3 hours after drinking the solution. This test takes about 3 hours to complete. You will need to stay at the testing location during this time. During the testing period:  Do not eat or drink anything other than the glucose solution.  Do not exercise.  Do not use any products that contain nicotine or tobacco, such as cigarettes and e-cigarettes. If you need help stopping, ask your health care provider. The testing procedure may vary among health care providers and hospitals. How are the results reported? Your results will be reported as milligrams of glucose per deciliter of blood (mg/dL) or millimoles per liter (mmol/L). Your health care provider will compare your results to normal ranges that were established after testing a large group of people (reference ranges). Reference ranges may vary among labs and hospitals. For this test, common reference ranges are:  Fasting: less than 95-105 mg/dL (5.3-5.8 mmol/L).  1 hour after drinking glucose: less than 180-190 mg/dL (10.0-10.5 mmol/L).  2 hours after drinking glucose: less than 155-165 mg/dL (8.6-9.2 mmol/L).  3 hours after drinking glucose: 140-145 mg/dL (7.8-8.1 mmol/L). What do the   results mean? Results within reference ranges are considered normal, meaning that your glucose levels are well-controlled. If two or more of your blood glucose levels are high, you may be diagnosed with gestational diabetes. If only one level is high, your health care  provider may suggest repeat testing or other tests to confirm a diagnosis. Talk with your health care provider about what your results mean. Questions to ask your health care provider Ask your health care provider, or the department that is doing the test:  When will my results be ready?  How will I get my results?  What are my treatment options?  What other tests do I need?  What are my next steps? Summary  The glucose tolerance test (GTT) is one of several tests used to diagnose diabetes that develops during pregnancy (gestational diabetes mellitus). Gestational diabetes is a temporary form of diabetes that some women develop during pregnancy.  You may have the GTT test after having a 1-hour glucose screening test if the results from that test indicate that you may have gestational diabetes. You may also have this test if you have any symptoms or risk factors for gestational diabetes.  Talk with your health care provider about what your results mean. This information is not intended to replace advice given to you by your health care provider. Make sure you discuss any questions you have with your health care provider. Document Released: 07/19/2011 Document Revised: 08/29/2016 Document Reviewed: 08/29/2016 Elsevier Interactive Patient Education  2019 Elsevier Inc.  

## 2018-02-07 NOTE — Progress Notes (Signed)
   PRENATAL VISIT NOTE  Subjective:  [name] is a 38 y.o. [phone] at [redacted]w[redacted]d being seen today for ongoing prenatal care.  She is currently monitored for the following issues for this high-risk pregnancy and has Depression with anxiety; Morbid obesity with body mass index of 45.0-49.9 in adult [facility]); Supervision of high risk pregnancy, antepartum; History of gestational hypertension; History of cesarean section; History of VBAC; and Obesity affecting pregnancy, antepartum on their problem list.  Patient reports no complaints.  Contractions: Irritability. Vag. Bleeding: None.  Movement: Present. Denies leaking of fluid.   The following portions of the patient's history were reviewed and updated as appropriate: allergies, current medications, past family history, past medical history, past social history, past surgical history and problem list. Problem list updated.  Objective:   Vitals:   02/07/18 0933  BP: 126/77  Pulse: 97  Weight: (!) 321 lb (145.6 kg)    Fetal Status: Fetal Heart Rate (bpm): 152 Fundal Height: 29 cm Movement: Present     General:  Alert, oriented and cooperative. Patient is in no acute distress.  Skin: Skin is warm and dry. No rash noted.   Cardiovascular: Normal heart rate noted  Respiratory: Normal respiratory effort, no problems with respiration noted  Abdomen: Soft, gravid, appropriate for gestational age.  Pain/Pressure: Present     Pelvic: Cervical exam deferred        Extremities: Normal range of motion.  Edema: None  Mental Status: Normal mood and affect. Normal behavior. Normal judgment and thought content.   Assessment and Plan:  Pregnancy: [id number] at [redacted]w[redacted]d  1. Supervision of high risk pregnancy, antepartum - Patient doing well, no complaints  - Anticipatory guidance on upcoming appointments with GTT at next visit, discussed fasting prior to appointment, patient verbalizes understanding  - Follow up US scheduled   2. History of gestational  hypertension - BP stable in office, continue BASA as ordered   3. History of cesarean section - Hx of C/S for cord prolapse in 2013, successful VBAC in 2018 - Plans VBAC   4. Obesity affecting pregnancy, antepartum - TWG 61lbs, educated and discussed eating habits and exercising during pregnancy in detail  - Encouraged patient to start adding brisk walking and moderate level exercise for at least 30 minutes 4x a week, patient verbalizes understanding  - Discussed modification in diet and decreasing amount of carbs and soda  - Increase water consumption   Preterm labor symptoms and general obstetric precautions including but not limited to vaginal bleeding, contractions, leaking of fluid and fetal movement were reviewed in detail with the patient. Please refer to After Visit Summary for other counseling recommendations.  Return in about 4 weeks (around 03/07/2018) for ROB/2hrGTT/labs.  Future Appointments  Date Time Provider Department Center  02/27/2018  9:30 AM [facility] [place] [facility]  03/07/2018  8:45 AM [facility] None  03/07/2018  9:00 AM Sharyon Cable, CNM [facility] None    Sharyon Cable, CNM

## 2018-02-27 ENCOUNTER — Other Ambulatory Visit (HOSPITAL_COMMUNITY): Payer: Self-pay | Admitting: *Deleted

## 2018-02-27 ENCOUNTER — Ambulatory Visit (HOSPITAL_COMMUNITY)
Admission: RE | Admit: 2018-02-27 | Discharge: 2018-02-27 | Disposition: A | Discharge status: Home / Self Care | Payer: Medicaid Other | Source: Ambulatory Visit | Attending: Obstetrics | Admitting: Obstetrics

## 2018-02-27 ENCOUNTER — Encounter (HOSPITAL_COMMUNITY): Payer: Self-pay

## 2018-02-27 DIAGNOSIS — O34219 Maternal care for unspecified type scar from previous cesarean delivery: Secondary | ICD-10-CM

## 2018-02-27 DIAGNOSIS — O09522 Supervision of elderly multigravida, second trimester: Secondary | ICD-10-CM

## 2018-02-27 DIAGNOSIS — Z362 Encounter for other antenatal screening follow-up: Secondary | ICD-10-CM

## 2018-02-27 DIAGNOSIS — Z3689 Encounter for other specified antenatal screening: Secondary | ICD-10-CM | POA: Insufficient documentation

## 2018-02-27 DIAGNOSIS — O09292 Supervision of pregnancy with other poor reproductive or obstetric history, second trimester: Secondary | ICD-10-CM

## 2018-02-27 DIAGNOSIS — O99212 Obesity complicating pregnancy, second trimester: Secondary | ICD-10-CM

## 2018-02-27 DIAGNOSIS — O99210 Obesity complicating pregnancy, unspecified trimester: Secondary | ICD-10-CM

## 2018-02-27 DIAGNOSIS — Z3A27 27 weeks gestation of pregnancy: Secondary | ICD-10-CM

## 2018-03-07 ENCOUNTER — Other Ambulatory Visit: Payer: Medicaid Other

## 2018-03-07 ENCOUNTER — Encounter: Payer: Self-pay | Admitting: Certified Nurse Midwife

## 2018-03-07 ENCOUNTER — Ambulatory Visit (INDEPENDENT_AMBULATORY_CARE_PROVIDER_SITE_OTHER): Payer: Medicaid Other | Admitting: Certified Nurse Midwife

## 2018-03-07 VITALS — BP 131/93 | HR 120 | Wt 316.4 lb

## 2018-03-07 DIAGNOSIS — Z8759 Personal history of other complications of pregnancy, childbirth and the puerperium: Secondary | ICD-10-CM

## 2018-03-07 DIAGNOSIS — O99210 Obesity complicating pregnancy, unspecified trimester: Secondary | ICD-10-CM

## 2018-03-07 DIAGNOSIS — O0993 Supervision of high risk pregnancy, unspecified, third trimester: Secondary | ICD-10-CM

## 2018-03-07 DIAGNOSIS — O99213 Obesity complicating pregnancy, third trimester: Secondary | ICD-10-CM

## 2018-03-07 DIAGNOSIS — O0990 Supervision of high risk pregnancy, unspecified, unspecified trimester: Secondary | ICD-10-CM

## 2018-03-07 DIAGNOSIS — Z98891 History of uterine scar from previous surgery: Secondary | ICD-10-CM

## 2018-03-07 NOTE — Progress Notes (Signed)
Pt presents ROB and 2 gtt labs. Pt c/o HA's and blurred vision x 2 weeks. Has hx of GHTN.

## 2018-03-07 NOTE — Patient Instructions (Signed)
Hypertension During Pregnancy ° °Hypertension is also called high blood pressure. High blood pressure means that the force of your blood moving in your body is too strong. When you are pregnant, this condition should be watched carefully. It can cause problems for you and your baby. °Follow these instructions at home: °Eating and drinking ° °· Drink enough fluid to keep your pee (urine) pale yellow. °· Avoid caffeine. °Lifestyle °· Do not use any products that contain nicotine or tobacco, such as cigarettes and e-cigarettes. If you need help quitting, ask your doctor. °· Do not use alcohol or drugs. °· Avoid stress. °· Rest and get plenty of sleep. °General instructions °· Take over-the-counter and prescription medicines only as told by your doctor. °· While lying down, lie on your left side. This keeps pressure off your major blood vessels. °· While sitting or lying down, raise (elevate) your feet. Try putting some pillows under your lower legs. °· Exercise regularly. Ask your doctor what kinds of exercise are best for you. °· Keep all prenatal and follow-up visits as told by your doctor. This is important. °Contact a doctor if: °· You have symptoms that your doctor told you to watch for, such as: °? Throwing up (vomiting). °? Feeling sick to your stomach (nausea). °? Headache. °Get help right away if you have: °· Very bad belly pain that does not get better with treatment. °· A very bad headache that does not get better. °· Throwing up that does not get better with treatment. °· Sudden, fast weight gain. °· Sudden swelling in your hands, ankles, or face. °· Bleeding from your vagina. °· Blood in your pee. °· Fewer movements from your baby than usual. °· Blurry vision. °· Double vision. °· Muscle twitching. °· Sudden muscle tightening (spasms). °· Trouble breathing. °· Blue fingernails or lips. °Summary °· Hypertension is also called high blood pressure. High blood pressure means that the force of your blood moving  in your body is too strong. °· When you are pregnant, this condition should be watched carefully. It can cause problems for you and your baby. °· Get help right away if you have symptoms that your doctor told you to watch for. °This information is not intended to replace advice given to you by your health care provider. Make sure you discuss any questions you have with your health care provider. °Document Released: 02/19/2010 Document Revised: 01/03/2017 Document Reviewed: 09/29/2015 °Elsevier Interactive Patient Education © 2019 Elsevier Inc. ° °

## 2018-03-08 LAB — GLUCOSE TOLERANCE, 2 HOURS W/ 1HR
Glucose, 1 hour: 152 mg/dL (ref 65–179)
Glucose, 2 hour: 89 mg/dL (ref 65–152)
Glucose, Fasting: 92 mg/dL — ABNORMAL HIGH (ref 65–91)

## 2018-03-08 LAB — CBC
Hematocrit: 34.3 % (ref 34.0–46.6)
Hemoglobin: 10.9 g/dL — ABNORMAL LOW (ref 11.1–15.9)
MCH: 24.3 pg — ABNORMAL LOW (ref 26.6–33.0)
MCHC: 31.8 g/dL (ref 31.5–35.7)
MCV: 77 fL — ABNORMAL LOW (ref 79–97)
Platelets: 281 10*3/uL (ref 150–450)
RBC: 4.48 x10E6/uL (ref 3.77–5.28)
RDW: 13.9 % (ref 11.7–15.4)
WBC: 12.8 10*3/uL — ABNORMAL HIGH (ref 3.4–10.8)

## 2018-03-08 LAB — COMPREHENSIVE METABOLIC PANEL
ALT: 15 IU/L (ref 0–32)
AST: 15 IU/L (ref 0–40)
Albumin/Globulin Ratio: 1.3 (ref 1.2–2.2)
Albumin: 3.7 g/dL — ABNORMAL LOW (ref 3.8–4.8)
Alkaline Phosphatase: 104 IU/L (ref 39–117)
BUN/Creatinine Ratio: 9 (ref 9–23)
BUN: 6 mg/dL (ref 6–20)
Bilirubin Total: 0.4 mg/dL (ref 0.0–1.2)
CO2: 20 mmol/L (ref 20–29)
Calcium: 9.2 mg/dL (ref 8.7–10.2)
Chloride: 103 mmol/L (ref 96–106)
Creatinine, Ser: 0.66 mg/dL (ref 0.57–1.00)
GFR calc Af Amer: 132 mL/min/{1.73_m2} (ref >59)
GFR calc non Af Amer: 115 mL/min/{1.73_m2} (ref >59)
Globulin, Total: 2.9 g/dL (ref 1.5–4.5)
Glucose: 93 mg/dL (ref 65–99)
Potassium: 4.6 mmol/L (ref 3.5–5.2)
Sodium: 138 mmol/L (ref 134–144)
Total Protein: 6.6 g/dL (ref 6.0–8.5)

## 2018-03-08 LAB — RPR: RPR Ser Ql: NONREACTIVE

## 2018-03-08 LAB — PROTEIN / CREATININE RATIO, URINE
Creatinine, Urine: 160.7 mg/dL
Protein, Ur: 21.6 mg/dL
Protein/Creat Ratio: 134 mg/g creat (ref 0–200)

## 2018-03-08 LAB — HIV ANTIBODY (ROUTINE TESTING W REFLEX): HIV Screen 4th Generation wRfx: NONREACTIVE

## 2018-03-08 NOTE — Progress Notes (Signed)
   PRENATAL VISIT NOTE  Subjective:  [name] is a 38 y.o. [phone] at 38w0dbeing seen today for ongoing prenatal care.  She is currently monitored for the following issues for this high-risk pregnancy and has Depression with anxiety; Morbid obesity with body mass index of 45.0-49.9 in adult ([facility]; Supervision of high risk pregnancy, antepartum; History of gestational hypertension; History of cesarean section; History of VBAC; and Obesity affecting pregnancy, antepartum on their problem list.  Patient reports headache and occasional blurred vision.  Contractions: Irregular. Vag. Bleeding: None.  Movement: Present. Denies leaking of fluid.   The following portions of the patient's history were reviewed and updated as appropriate: allergies, current medications, past family history, past medical history, past social history, past surgical history and problem list. Problem list updated.  Objective:   Vitals:   03/07/18 0849  BP: (!) 131/93  Pulse: (!) 120  Weight: (!) 316 lb 6.4 oz (143.5 kg)    Fetal Status: Fetal Heart Rate (bpm): 140 Fundal Height: 35 cm Movement: Present     General:  Alert, oriented and cooperative. Patient is in no acute distress.  Skin: Skin is warm and dry. No rash noted.   Cardiovascular: Normal heart rate noted  Respiratory: Normal respiratory effort, no problems with respiration noted  Abdomen: Soft, gravid, appropriate for gestational age.  Pain/Pressure: Present     Pelvic: Cervical exam deferred        Extremities: Normal range of motion.  Edema: None  Mental Status: Normal mood and affect. Normal behavior. Normal judgment and thought content.   Assessment and Plan:  Pregnancy: [id number] 223w0d1. Supervision of high risk pregnancy, antepartum - Anticipatory guidance on upcoming appointments  - Discussed what results of GTT means with management if abnormal results  - Glucose Tolerance, 2 Hours w/1 Hour - HIV Antibody (routine testing w rflx) -  RPR - CBC  2. History of gestational hypertension - Hx of HTN with last pregnancy, BP slightly elevated today 131/93 - Reports occasional HA, rates pain 3/10 that is relieved with Tylenol  - Reports "seeing spots" occasionally for the past 2 weeks denies vision changes or epigastric pain today  - Comp Met (CMET) - Protein / creatinine ratio, urine  3. Obesity affecting pregnancy, antepartum - TWG 56lbs   4. History of cesarean section - Plans TOLAC, has had successful VBAC   Preterm labor symptoms and general obstetric precautions including but not limited to vaginal bleeding, contractions, leaking of fluid and fetal movement were reviewed in detail with the patient. Please refer to After Visit Summary for other counseling recommendations.  Return in about 2 weeks (around 03/21/2018) for ROB.  Future Appointments  Date Time Provider DeOwendale2/19/2020  9:30 AM RoLajean ManesCNM [facility] None  03/30/2018  9:30 AM [facility] [place] WestCNM

## 2018-03-20 ENCOUNTER — Other Ambulatory Visit: Payer: Self-pay

## 2018-03-20 ENCOUNTER — Inpatient Hospital Stay (HOSPITAL_COMMUNITY)
Admission: AD | Admit: 2018-03-20 | Discharge: 2018-03-20 | Disposition: A | Discharge status: Home / Self Care | Payer: Medicaid Other | Attending: Obstetrics and Gynecology | Admitting: Obstetrics and Gynecology

## 2018-03-20 ENCOUNTER — Encounter (HOSPITAL_COMMUNITY): Payer: Self-pay | Admitting: *Deleted

## 2018-03-20 DIAGNOSIS — O4703 False labor before 37 completed weeks of gestation, third trimester: Secondary | ICD-10-CM | POA: Diagnosis not present

## 2018-03-20 DIAGNOSIS — Z3A30 30 weeks gestation of pregnancy: Secondary | ICD-10-CM | POA: Diagnosis not present

## 2018-03-20 LAB — URINALYSIS, ROUTINE W REFLEX MICROSCOPIC
Bilirubin Urine: NEGATIVE
GLUCOSE, UA: NEGATIVE mg/dL
HGB URINE DIPSTICK: NEGATIVE
Ketones, ur: NEGATIVE mg/dL
Leukocytes,Ua: NEGATIVE
Nitrite: NEGATIVE
Protein, ur: NEGATIVE mg/dL
Specific Gravity, Urine: 1.01 (ref 1.005–1.030)
pH: 6 (ref 5.0–8.0)

## 2018-03-20 LAB — FETAL FIBRONECTIN: Fetal Fibronectin: NEGATIVE

## 2018-03-20 MED ORDER — ONDANSETRON 8 MG PO TBDP
8.0000 mg | ORAL_TABLET | Freq: Once | ORAL | Status: AC
  Administered 2018-03-20: 8 mg via ORAL
  Filled 2018-03-20: qty 1

## 2018-03-20 MED ORDER — NIFEDIPINE 10 MG PO CAPS
10.0000 mg | ORAL_CAPSULE | Freq: Once | ORAL | Status: AC
  Administered 2018-03-20: 10 mg via ORAL
  Filled 2018-03-20: qty 1

## 2018-03-20 MED ORDER — ONDANSETRON 4 MG PO TBDP: 4.0000 mg | ORAL_TABLET | Freq: Three times a day (TID) | ORAL | 0 refills | Status: DC | PRN

## 2018-03-20 NOTE — Discharge Instructions (Signed)
Fetal Movement Counts °Patient Name: ________________________________________________ Patient Due Date: ____________________ °What is a fetal movement count? ° °A fetal movement count is the number of times that you feel your baby move during a certain amount of time. This may also be called a fetal kick count. A fetal movement count is recommended for every pregnant woman. You may be asked to start counting fetal movements as early as week 28 of your pregnancy. °Pay attention to when your baby is most active. You may notice your baby's sleep and wake cycles. You may also notice things that make your baby move more. You should do a fetal movement count: °· When your baby is normally most active. °· At the same time each day. °A good time to count movements is while you are resting, after having something to eat and drink. °How do I count fetal movements? °1. Find a quiet, comfortable area. Sit, or lie down on your side. °2. Write down the date, the start time and stop time, and the number of movements that you felt between those two times. Take this information with you to your health care visits. °3. For 2 hours, count kicks, flutters, swishes, rolls, and jabs. You should feel at least 10 movements during 2 hours. °4. You may stop counting after you have felt 10 movements. °5. If you do not feel 10 movements in 2 hours, have something to eat and drink. Then, keep resting and counting for 1 hour. If you feel at least 4 movements during that hour, you may stop counting. °Contact a health care provider if: °· You feel fewer than 4 movements in 2 hours. °· Your baby is not moving like he or she usually does. °Date: ____________ Start time: ____________ Stop time: ____________ Movements: ____________ °Date: ____________ Start time: ____________ Stop time: ____________ Movements: ____________ °Date: ____________ Start time: ____________ Stop time: ____________ Movements: ____________ °Date: ____________ Start time:  ____________ Stop time: ____________ Movements: ____________ °Date: ____________ Start time: ____________ Stop time: ____________ Movements: ____________ °Date: ____________ Start time: ____________ Stop time: ____________ Movements: ____________ °Date: ____________ Start time: ____________ Stop time: ____________ Movements: ____________ °Date: ____________ Start time: ____________ Stop time: ____________ Movements: ____________ °Date: ____________ Start time: ____________ Stop time: ____________ Movements: ____________ °This information is not intended to replace advice given to you by your health care provider. Make sure you discuss any questions you have with your health care provider. °Document Released: 02/16/2006 Document Revised: 09/16/2015 Document Reviewed: 02/26/2015 °Elsevier Interactive Patient Education © 2019 [employer] °Warning Signs During Pregnancy °A pregnancy lasts about 40 weeks, starting from the first day of your last period until the baby is born. Pregnancy is divided into three phases called trimesters. °· The first trimester refers to week 1 through week 13 of pregnancy. °· The second trimester is the start of week 14 through the end of week 27. °· The third trimester is the start of week 28 until you deliver your baby. °During each trimester of pregnancy, certain signs and symptoms may indicate a problem. Talk with your health care provider about your current health and any medical conditions you have. Make sure you know the symptoms that you should watch for and report. °How does this affect me? ° °Warning signs in the first trimester °While some changes during the first trimester may be uncomfortable, most do not represent a serious problem. Let your health care provider know if you have any of the following warning signs in the first trimester: °· You cannot   eat or drink without vomiting, and this lasts for longer than a day. °· You have vaginal bleeding or spotting along with  menstrual-like cramping. °· You have diarrhea for longer than a day. °· You have a fever or other signs of infection, such as: °? Pain or burning when you urinate. °? Foul smelling or thick or yellowish vaginal discharge. °Warning signs in the second trimester °As your baby grows and changes during the second trimester, there are additional signs and symptoms that may indicate a problem. These include: °· Signs and symptoms of infection, including a fever. °· Signs or symptoms of a miscarriage or preterm labor, such as regular contractions, menstrual-like cramping, or lower abdominal pain. °· Bloody or watery vaginal discharge or obvious vaginal bleeding. °· Feeling like your heart is pounding. °· Having trouble breathing. °· Nausea, vomiting, or diarrhea that lasts for longer than a day. °· Craving non-food items, such as clay, chalk, or dirt. This may be a sign of a very treatable medical condition called pica. °Later in your second trimester, watch for signs and symptoms of a serious medical condition called preeclampsia.These include: °· Changes in your vision. °· A severe headache that does not go away. °· Nausea and vomiting. °It is also important to notice if your baby stops moving or moves less than usual during this time. °Warning signs in the third trimester °As you approach the third trimester, your baby is growing and your body is preparing for the birth of your baby. In your third trimester, be sure to let your health care provider know if: °· You have signs and symptoms of infection, including a fever. °· You have vaginal bleeding. °· You notice that your baby is moving less than usual or is not moving. °· You have nausea, vomiting, or diarrhea that lasts for longer than a day. °· You have a severe headache that does not go away. °· You have vision changes, including seeing spots or having blurry or double vision. °· You have increased swelling in your hands or face. °How does this affect my  baby? °Throughout your pregnancy, always report any of the warning signs of a problem to your health care provider. This can help prevent complications that may affect your baby, including: °· Increased risk for premature birth. °· Infection that may be transmitted to your baby. °· Increased risk for stillbirth. °Contact a health care provider if: °· You have any of the warning signs of a problem for the current trimester of your pregnancy. °· Any of the following apply to you during any trimester of pregnancy: °? You have strong emotions, such as sadness or anxiety, that interfere with work or personal relationships. °? You feel unsafe in your home and need help finding a safe place to live. °? You are using tobacco products, alcohol, or drugs and you need help to stop. °Get help right away if: °You have signs or symptoms of labor before 37 weeks of pregnancy. These include: °· Contractions that are 5 minutes or less apart, or that increase in frequency, intensity, or length. °· Sudden, sharp abdominal pain or low back pain. °· Uncontrolled gush or trickle of fluid from your vagina. °Summary °· A pregnancy lasts about 40 weeks, starting from the first day of your last period until the baby is born. Pregnancy is divided into three phases called trimesters. Each trimester has warning signs to watch for. °· Always report any warning signs to your health care provider in order to   prevent complications that may affect both you and your baby. °· Talk with your health care provider about your current health and any medical conditions you have. Make sure you know the symptoms that you should watch for and report. °This information is not intended to replace advice given to you by your health care provider. Make sure you discuss any questions you have with your health care provider. °Document Released: 11/03/2016 Document Revised: 11/03/2016 Document Reviewed: 11/03/2016 °Elsevier Interactive Patient Education © 2019  [employer] ° °

## 2018-03-20 NOTE — MAU Provider Note (Signed)
Chief Complaint:  Abdominal Pain   First Provider Initiated Contact with Patient 03/20/18 1247     HPI: [name] is a 38 y.o. [id number] at [redacted]w[redacted]d who presents to maternity admissions reporting contractions. Symptoms started last night. Reports consistent contractions every 5-6 minutes. Says that aren't painful but they are more uncomfortable than braxton hicks. Vomited once this morning and continues to have some nausea. Had 2 loose stools this morning. No sick contacts.  Denies leakage of fluid or vaginal bleeding. Good fetal movement.  Location: abdomen Quality: contractions Severity: 5/10 in pain scale Duration: 1 day Timing: every 5-6 minutes Modifying factors: none Associated signs and symptoms: n/v/d  Past Medical History:  Diagnosis Date  . Depression with anxiety 07/04/2013  . Heart murmur   . Morbid obesity (HCC) 03/21/2013  . Postpartum depression    After fetal loss in 2005 due to anomalies   OB History  Gravida Para Term Preterm AB Living  8 4 4  0 3 3  SAB TAB Ectopic Multiple Live Births  2 1 0 0 4    # Outcome Date GA Lbr Len/2nd Weight Sex Delivery Anes PTL Lv  8 Current           7 Term 02/19/16 [redacted]w[redacted]d 06:21 / 00:20 3150 g M VBAC EPI  LIV  6 Term 02/24/11 [redacted]w[redacted]d  3430 g M CS-LTranv EPI  LIV     Birth Comments: laceration above left eyebrow     Complications: Umbilical cord prolapse  5 SAB 2012 [redacted]w[redacted]d            Birth Comments: IUFD  4 SAB 2007 [redacted]w[redacted]d         3 TAB 2005 [redacted]w[redacted]d            Birth Comments: multiple anomalies  2 Term 2005    F Vag-Spont   DEC     Birth Comments: SIDS 2 mo.  1 Term 2002 [redacted]w[redacted]d   M Vag-Spont   LIV   Past Surgical History:  Procedure Laterality Date  . BREAST SURGERY  2005   L breast infection after loss  . CESAREAN SECTION  02/24/2011   Procedure: CESAREAN SECTION;  Surgeon: Brock Bad, MD;  Location: [facility];  Service: Gynecology;  Laterality: N/A;  Primary, cord ph 7.29   Family History  Problem Relation Age of Onset  .  Diabetes Maternal Aunt   . Breast cancer Maternal Aunt        2 maternal aunts with breast cancer diagnosed at 21 and 36 yo  . Cancer Maternal Grandfather        lung  . Colon cancer Maternal Grandfather        died in his 45's.    Social History   Tobacco Use  . Smoking status: Never Smoker  . Smokeless tobacco: Never Used  Substance Use Topics  . Alcohol use: No    Frequency: Never    Comment: occasional, social drinking  . Drug use: No   Allergies  Allergen Reactions  . Codeine Nausea And Vomiting    Per patient, "I cannot take percocet."   Medications Prior to Admission  Medication Sig Dispense Refill Last Dose  . aspirin EC 81 MG tablet Take 1 tablet (81 mg total) by mouth daily. Take after 12 weeks for prevention of preeclampsia later in pregnancy (Patient not taking: Reported on 12/13/2017) 300 tablet 2 Not Taking  . prenatal vitamin w/FE, FA (PRENATAL 1 + 1) 27-1 MG TABS tablet Take  1 tablet by mouth daily at 12 noon.   Taking    I have reviewed patient's Past Medical Hx, Surgical Hx, Family Hx, Social Hx, medications and allergies.   ROS:  Review of Systems  Constitutional: Negative.   Gastrointestinal: Positive for abdominal pain, diarrhea, nausea and vomiting. Negative for constipation.  Genitourinary: Negative.     Physical Exam   Patient Vitals for the past 24 hrs:  BP Temp Pulse Resp SpO2 Weight  03/20/18 1446 124/69 - (!) 109 18 - (!) 144.7 kg  03/20/18 1235 131/69 97.6 F (36.4 C) (!) 114 18 99 % -    Constitutional: Well-developed, well-nourished female in no acute distress.  Cardiovascular: normal rate & rhythm, no murmur Respiratory: normal effort, lung sounds clear throughout GI: Abd soft, non-tender, gravid appropriate for gestational age. Pos BS x 4 MS: Extremities nontender, no edema, normal ROM Neurologic: Alert and oriented x 4.  GU:    Dilation: 1 Effacement (%): 40 Station: Ballotable Exam by:: Hormel FoodsLawrence NP  NST:  Baseline: 145 bpm,  Variability: Good {> 6 bpm), Accelerations: Reactive and Decelerations: Absent   Labs: Results for orders placed or performed during the hospital encounter of 03/20/18 (from the past 24 hour(s))  Urinalysis, Routine w reflex microscopic     Status: None   Collection Time: 03/20/18 12:36 PM  Result Value Ref Range   Color, Urine YELLOW YELLOW   APPearance CLEAR CLEAR   Specific Gravity, Urine 1.010 1.005 - 1.030   pH 6.0 5.0 - 8.0   Glucose, UA NEGATIVE NEGATIVE mg/dL   Hgb urine dipstick NEGATIVE NEGATIVE   Bilirubin Urine NEGATIVE NEGATIVE   Ketones, ur NEGATIVE NEGATIVE mg/dL   Protein, ur NEGATIVE NEGATIVE mg/dL   Nitrite NEGATIVE NEGATIVE   Leukocytes,Ua NEGATIVE NEGATIVE  Fetal fibronectin     Status: None   Collection Time: 03/20/18 12:56 PM  Result Value Ref Range   Fetal Fibronectin NEGATIVE NEGATIVE    Imaging:  No results found.  MAU Course: Orders Placed This Encounter  Procedures  . Urinalysis, Routine w reflex microscopic  . Fetal fibronectin  . Discharge patient   Meds ordered this encounter  Medications  . ondansetron (ZOFRAN-ODT) disintegrating tablet 8 mg  . NIFEdipine (PROCARDIA) capsule 10 mg  . ondansetron (ZOFRAN ODT) 4 MG disintegrating tablet    Sig: Take 1 tablet (4 mg total) by mouth every 8 (eight) hours as needed for nausea or vomiting.    Dispense:  15 tablet    Refill:  0    Order Specific Question:   Supervising Provider    Answer:   Conan BowensDAVIS, KELLY M [[id number]]    MDM: No ctx on monitor or palpated. Cervix 1/thick/ballotable. FFN negative & cervix unchanged after 1+ hour of monitoring. Patient sleeping in room when returned for repeat cervical exam.  Will d/c home with rx zofran.  Patient has OB appt tomorrow.  Assessment: 1. Preterm uterine contractions in third trimester, antepartum   2. [redacted] weeks gestation of pregnancy     Plan: Discharge home in stable condition.  Preterm Labor precautions and fetal kick counts   Allergies as of  03/20/2018      Reactions   Codeine Nausea And Vomiting   Per patient, "I cannot take percocet."      Medication List    TAKE these medications   aspirin EC 81 MG tablet Take 1 tablet (81 mg total) by mouth daily. Take after 12 weeks for prevention of preeclampsia later in pregnancy  ondansetron 4 MG disintegrating tablet Commonly known as:  ZOFRAN ODT Take 1 tablet (4 mg total) by mouth every 8 (eight) hours as needed for nausea or vomiting.   prenatal vitamin w/FE, FA 27-1 MG Tabs tablet Take 1 tablet by mouth daily at 12 noon.       Judeth Horn, NP 03/20/2018 2:50 PM

## 2018-03-20 NOTE — MAU Note (Signed)
PT presents to MAU with complaints of contractions that started last night around 8pm. Denies any LOF or vaginal bleeding

## 2018-03-21 ENCOUNTER — Ambulatory Visit (INDEPENDENT_AMBULATORY_CARE_PROVIDER_SITE_OTHER): Payer: Medicaid Other | Admitting: Certified Nurse Midwife

## 2018-03-21 ENCOUNTER — Encounter: Payer: Self-pay | Admitting: Certified Nurse Midwife

## 2018-03-21 VITALS — BP 130/83 | HR 116 | Wt 317.0 lb

## 2018-03-21 DIAGNOSIS — Z8759 Personal history of other complications of pregnancy, childbirth and the puerperium: Secondary | ICD-10-CM

## 2018-03-21 DIAGNOSIS — O0990 Supervision of high risk pregnancy, unspecified, unspecified trimester: Secondary | ICD-10-CM

## 2018-03-21 DIAGNOSIS — Z98891 History of uterine scar from previous surgery: Secondary | ICD-10-CM

## 2018-03-21 DIAGNOSIS — O0993 Supervision of high risk pregnancy, unspecified, third trimester: Secondary | ICD-10-CM

## 2018-03-21 DIAGNOSIS — Z3A31 31 weeks gestation of pregnancy: Secondary | ICD-10-CM

## 2018-03-21 DIAGNOSIS — O99210 Obesity complicating pregnancy, unspecified trimester: Secondary | ICD-10-CM

## 2018-03-21 DIAGNOSIS — O99213 Obesity complicating pregnancy, third trimester: Secondary | ICD-10-CM

## 2018-03-21 NOTE — Patient Instructions (Signed)

## 2018-03-23 NOTE — Progress Notes (Signed)
   PRENATAL VISIT NOTE  Subjective:  [name] is a 38 y.o. [phone] at [redacted]w[redacted]d being seen today for ongoing prenatal care.  She is currently monitored for the following issues for this low-risk pregnancy and has Depression with anxiety; Morbid obesity with body mass index of 45.0-49.9 in adult [facility]); Supervision of high risk pregnancy, antepartum; History of gestational hypertension; History of cesarean section; History of VBAC; and Obesity affecting pregnancy, antepartum on their problem list.  Patient reports no complaints.  Contractions: Irritability. Vag. Bleeding: None.  Movement: Present. Denies leaking of fluid.   The following portions of the patient's history were reviewed and updated as appropriate: allergies, current medications, past family history, past medical history, past social history, past surgical history and problem list. Problem list updated.  Objective:  Vitals:   03/21/18 0947  BP: 130/83  Pulse: (!) 116  Weight: (!) 317 lb (143.8 kg)    Fetal Status: Fetal Heart Rate (bpm): 140   Movement: Present     General:  Alert, oriented and cooperative. Patient is in no acute distress.  Skin: Skin is warm and dry. No rash noted.   Cardiovascular: Normal heart rate noted  Respiratory: Normal respiratory effort, no problems with respiration noted  Abdomen: Soft, gravid, appropriate for gestational age.  Pain/Pressure: Present     Pelvic: Cervical exam deferred        Extremities: Normal range of motion.     Mental Status: Normal mood and affect. Normal behavior. Normal judgment and thought content.   Assessment and Plan:  Pregnancy: [id number] at 109w1d  1. Supervision of high risk pregnancy, antepartum - Patient doing well, no complaints - Anticipatory guidance on upcoming appointments  - Discussed results of 28 week labs with patient, informed of borderline passing of GTT with fasting being 92  - Encouraged patient to change diet and cut down on sweets and carbs,  patient verbalizes understanding  - Will continue to monitor and keep close watch on glucose levels   2. History of gestational hypertension - BP stable at this time  3. Obesity affecting pregnancy, antepartum - Continue BASA - Discussed recommends of weight gain during pregnancy   4. History of cesarean section - Plans for TOLAC, patient had successful VBAC previously   Preterm labor symptoms and general obstetric precautions including but not limited to vaginal bleeding, contractions, leaking of fluid and fetal movement were reviewed in detail with the patient. Please refer to After Visit Summary for other counseling recommendations.  Return in about 2 weeks (around 04/04/2018) for ROB.  Future Appointments  Date Time Provider Department Center  03/30/2018  9:30 AM [facility] [place] [facility]  04/04/2018  9:30 AM Hermina Staggers, MD [facility] None    Sharyon Cable, CNM

## 2018-03-30 ENCOUNTER — Other Ambulatory Visit (HOSPITAL_COMMUNITY): Payer: Self-pay | Admitting: *Deleted

## 2018-03-30 ENCOUNTER — Encounter (HOSPITAL_COMMUNITY): Payer: Self-pay

## 2018-03-30 ENCOUNTER — Ambulatory Visit (HOSPITAL_COMMUNITY)
Admission: RE | Admit: 2018-03-30 | Discharge: 2018-03-30 | Disposition: A | Discharge status: Home / Self Care | Payer: Medicaid Other | Source: Ambulatory Visit | Attending: Obstetrics and Gynecology | Admitting: Obstetrics and Gynecology

## 2018-03-30 ENCOUNTER — Ambulatory Visit (HOSPITAL_COMMUNITY): Payer: Medicaid Other | Admitting: *Deleted

## 2018-03-30 VITALS — BP 128/73 | HR 107 | Wt 316.0 lb

## 2018-03-30 DIAGNOSIS — Z362 Encounter for other antenatal screening follow-up: Secondary | ICD-10-CM

## 2018-03-30 DIAGNOSIS — O0990 Supervision of high risk pregnancy, unspecified, unspecified trimester: Secondary | ICD-10-CM | POA: Diagnosis present

## 2018-03-30 DIAGNOSIS — O139 Gestational [pregnancy-induced] hypertension without significant proteinuria, unspecified trimester: Secondary | ICD-10-CM

## 2018-03-30 DIAGNOSIS — O09299 Supervision of pregnancy with other poor reproductive or obstetric history, unspecified trimester: Secondary | ICD-10-CM | POA: Diagnosis not present

## 2018-03-30 DIAGNOSIS — O09522 Supervision of elderly multigravida, second trimester: Secondary | ICD-10-CM

## 2018-03-30 DIAGNOSIS — Z3A32 32 weeks gestation of pregnancy: Secondary | ICD-10-CM

## 2018-03-30 DIAGNOSIS — O34219 Maternal care for unspecified type scar from previous cesarean delivery: Secondary | ICD-10-CM

## 2018-03-30 DIAGNOSIS — O99210 Obesity complicating pregnancy, unspecified trimester: Secondary | ICD-10-CM | POA: Insufficient documentation

## 2018-03-30 NOTE — Progress Notes (Unsigned)
.  mfm

## 2018-04-04 ENCOUNTER — Ambulatory Visit (INDEPENDENT_AMBULATORY_CARE_PROVIDER_SITE_OTHER): Payer: Medicaid Other | Admitting: Obstetrics and Gynecology

## 2018-04-04 ENCOUNTER — Encounter: Payer: Self-pay | Admitting: Obstetrics and Gynecology

## 2018-04-04 VITALS — BP 116/78 | Wt 319.8 lb

## 2018-04-04 DIAGNOSIS — O0990 Supervision of high risk pregnancy, unspecified, unspecified trimester: Secondary | ICD-10-CM

## 2018-04-04 DIAGNOSIS — Z8759 Personal history of other complications of pregnancy, childbirth and the puerperium: Secondary | ICD-10-CM

## 2018-04-04 DIAGNOSIS — Z6841 Body Mass Index (BMI) 40.0 and over, adult: Secondary | ICD-10-CM

## 2018-04-04 DIAGNOSIS — Z98891 History of uterine scar from previous surgery: Secondary | ICD-10-CM

## 2018-04-04 DIAGNOSIS — O99213 Obesity complicating pregnancy, third trimester: Secondary | ICD-10-CM

## 2018-04-04 DIAGNOSIS — Z3A32 32 weeks gestation of pregnancy: Secondary | ICD-10-CM

## 2018-04-04 NOTE — Patient Instructions (Signed)
Third Trimester of Pregnancy The third trimester is from week 28 through week 40 (months 7 through 9). The third trimester is a time when the unborn baby (fetus) is growing rapidly. At the end of the ninth month, the fetus is about 20 inches in length and weighs 6-10 pounds. Body changes during your third trimester Your body will continue to go through many changes during pregnancy. The changes vary from woman to woman. During the third trimester:  Your weight will continue to increase. You can expect to gain 25-35 pounds (11-16 kg) by the end of the pregnancy.  You may begin to get stretch marks on your hips, abdomen, and breasts.  You may urinate more often because the fetus is moving lower into your pelvis and pressing on your bladder.  You may develop or continue to have heartburn. This is caused by increased hormones that slow down muscles in the digestive tract.  You may develop or continue to have constipation because increased hormones slow digestion and cause the muscles that push waste through your intestines to relax.  You may develop hemorrhoids. These are swollen veins (varicose veins) in the rectum that can itch or be painful.  You may develop swollen, bulging veins (varicose veins) in your legs.  You may have increased body aches in the pelvis, back, or thighs. This is due to weight gain and increased hormones that are relaxing your joints.  You may have changes in your hair. These can include thickening of your hair, rapid growth, and changes in texture. Some women also have hair loss during or after pregnancy, or hair that feels dry or thin. Your hair will most likely return to normal after your baby is born.  Your breasts will continue to grow and they will continue to become tender. A yellow fluid (colostrum) may leak from your breasts. This is the first milk you are producing for your baby.  Your belly button may stick out.  You may notice more swelling in your hands,  face, or ankles.  You may have increased tingling or numbness in your hands, arms, and legs. The skin on your belly may also feel numb.  You may feel short of breath because of your expanding uterus.  You may have more problems sleeping. This can be caused by the size of your belly, increased need to urinate, and an increase in your body's metabolism.  You may notice the fetus "dropping," or moving lower in your abdomen (lightening).  You may have increased vaginal discharge.  You may notice your joints feel loose and you may have pain around your pelvic bone. What to expect at prenatal visits You will have prenatal exams every 2 weeks until week 36. Then you will have weekly prenatal exams. During a routine prenatal visit:  You will be weighed to make sure you and the baby are growing normally.  Your blood pressure will be taken.  Your abdomen will be measured to track your baby's growth.  The fetal heartbeat will be listened to.  Any test results from the previous visit will be discussed.  You may have a cervical check near your due date to see if your cervix has softened or thinned (effaced).  You will be tested for Group B streptococcus. This happens between 35 and 37 weeks. Your health care provider may ask you:  What your birth plan is.  How you are feeling.  If you are feeling the baby move.  If you have had any abnormal   symptoms, such as leaking fluid, bleeding, severe headaches, or abdominal cramping.  If you are using any tobacco products, including cigarettes, chewing tobacco, and electronic cigarettes.  If you have any questions. Other tests or screenings that may be performed during your third trimester include:  Blood tests that check for low iron levels (anemia).  Fetal testing to check the health, activity level, and growth of the fetus. Testing is done if you have certain medical conditions or if there are problems during the pregnancy.  Nonstress test  (NST). This test checks the health of your baby to make sure there are no signs of problems, such as the baby not getting enough oxygen. During this test, a belt is placed around your belly. The baby is made to move, and its heart rate is monitored during movement. What is false labor? False labor is a condition in which you feel small, irregular tightenings of the muscles in the womb (contractions) that usually go away with rest, changing position, or drinking water. These are called Braxton Hicks contractions. Contractions may last for hours, days, or even weeks before true labor sets in. If contractions come at regular intervals, become more frequent, increase in intensity, or become painful, you should see your health care provider. What are the signs of labor?  Abdominal cramps.  Regular contractions that start at 10 minutes apart and become stronger and more frequent with time.  Contractions that start on the top of the uterus and spread down to the lower abdomen and back.  Increased pelvic pressure and dull back pain.  A watery or bloody mucus discharge that comes from the vagina.  Leaking of amniotic fluid. This is also known as your "water breaking." It could be a slow trickle or a gush. Let your health care provider know if it has a color or strange odor. If you have any of these signs, call your health care provider right away, even if it is before your due date. Follow these instructions at home: Medicines  Follow your health care provider's instructions regarding medicine use. Specific medicines may be either safe or unsafe to take during pregnancy.  Take a prenatal vitamin that contains at least 600 micrograms (mcg) of folic acid.  If you develop constipation, try taking a stool softener if your health care provider approves. Eating and drinking   Eat a balanced diet that includes fresh fruits and vegetables, whole grains, good sources of protein such as meat, eggs, or tofu,  and low-fat dairy. Your health care provider will help you determine the amount of weight gain that is right for you.  Avoid raw meat and uncooked cheese. These carry germs that can cause birth defects in the baby.  If you have low calcium intake from food, talk to your health care provider about whether you should take a daily calcium supplement.  Eat four or five small meals rather than three large meals a day.  Limit foods that are high in fat and processed sugars, such as fried and sweet foods.  To prevent constipation: ? Drink enough fluid to keep your urine clear or pale yellow. ? Eat foods that are high in fiber, such as fresh fruits and vegetables, whole grains, and beans. Activity  Exercise only as directed by your health care provider. Most women can continue their usual exercise routine during pregnancy. Try to exercise for 30 minutes at least 5 days a week. Stop exercising if you experience uterine contractions.  Avoid heavy lifting.  Do   not exercise in extreme heat or humidity, or at high altitudes.  Wear low-heel, comfortable shoes.  Practice good posture.  You may continue to have sex unless your health care provider tells you otherwise. Relieving pain and discomfort  Take frequent breaks and rest with your legs elevated if you have leg cramps or low back pain.  Take warm sitz baths to soothe any pain or discomfort caused by hemorrhoids. Use hemorrhoid cream if your health care provider approves.  Wear a good support bra to prevent discomfort from breast tenderness.  If you develop varicose veins: ? Wear support pantyhose or compression stockings as told by your healthcare provider. ? Elevate your feet for 15 minutes, 3-4 times a day. Prenatal care  Write down your questions. Take them to your prenatal visits.  Keep all your prenatal visits as told by your health care provider. This is important. Safety  Wear your seat belt at all times when driving.  Make  a list of emergency phone numbers, including numbers for family, friends, the hospital, and police and fire departments. General instructions  Avoid cat litter boxes and soil used by cats. These carry germs that can cause birth defects in the baby. If you have a cat, ask someone to clean the litter box for you.  Do not travel far distances unless it is absolutely necessary and only with the approval of your health care provider.  Do not use hot tubs, steam rooms, or saunas.  Do not drink alcohol.  Do not use any products that contain nicotine or tobacco, such as cigarettes and e-cigarettes. If you need help quitting, ask your health care provider.  Do not use any medicinal herbs or unprescribed drugs. These chemicals affect the formation and growth of the baby.  Do not douche or use tampons or scented sanitary pads.  Do not cross your legs for long periods of time.  To prepare for the arrival of your baby: ? Take prenatal classes to understand, practice, and ask questions about labor and delivery. ? Make a trial run to the hospital. ? Visit the hospital and tour the maternity area. ? Arrange for maternity or paternity leave through employers. ? Arrange for family and friends to take care of pets while you are in the hospital. ? Purchase a rear-facing car seat and make sure you know how to install it in your car. ? Pack your hospital bag. ? Prepare the baby's nursery. Make sure to remove all pillows and stuffed animals from the baby's crib to prevent suffocation.  Visit your dentist if you have not gone during your pregnancy. Use a soft toothbrush to brush your teeth and be gentle when you floss. Contact a health care provider if:  You are unsure if you are in labor or if your water has broken.  You become dizzy.  You have mild pelvic cramps, pelvic pressure, or nagging pain in your abdominal area.  You have lower back pain.  You have persistent nausea, vomiting, or  diarrhea.  You have an unusual or bad smelling vaginal discharge.  You have pain when you urinate. Get help right away if:  Your water breaks before 37 weeks.  You have regular contractions less than 5 minutes apart before 37 weeks.  You have a fever.  You are leaking fluid from your vagina.  You have spotting or bleeding from your vagina.  You have severe abdominal pain or cramping.  You have rapid weight loss or weight gain.  You have   shortness of breath with chest pain.  You notice sudden or extreme swelling of your face, hands, ankles, feet, or legs.  Your baby makes fewer than 10 movements in 2 hours.  You have severe headaches that do not go away when you take medicine.  You have vision changes. Summary  The third trimester is from week 28 through week 40, months 7 through 9. The third trimester is a time when the unborn baby (fetus) is growing rapidly.  During the third trimester, your discomfort may increase as you and your baby continue to gain weight. You may have abdominal, leg, and back pain, sleeping problems, and an increased need to urinate.  During the third trimester your breasts will keep growing and they will continue to become tender. A yellow fluid (colostrum) may leak from your breasts. This is the first milk you are producing for your baby.  False labor is a condition in which you feel small, irregular tightenings of the muscles in the womb (contractions) that eventually go away. These are called Braxton Hicks contractions. Contractions may last for hours, days, or even weeks before true labor sets in.  Signs of labor can include: abdominal cramps; regular contractions that start at 10 minutes apart and become stronger and more frequent with time; watery or bloody mucus discharge that comes from the vagina; increased pelvic pressure and dull back pain; and leaking of amniotic fluid. This information is not intended to replace advice given to you by your  health care provider. Make sure you discuss any questions you have with your health care provider. Document Released: 01/11/2001 Document Revised: 02/23/2016 Document Reviewed: 02/23/2016 Elsevier Interactive Patient Education  2019 [employer]  

## 2018-04-04 NOTE — Progress Notes (Signed)
Subjective:  [name] is a 38 y.o. [phone] at [redacted]w[redacted]d being seen today for ongoing prenatal care.  She is currently monitored for the following issues for this high-risk pregnancy and has Depression with anxiety; Morbid obesity with body mass index of 45.0-49.9 in adult [facility]); Supervision of high risk pregnancy, antepartum; History of gestational hypertension; History of cesarean section; History of VBAC; and Obesity affecting pregnancy, antepartum on their problem list.  Patient reports no complaints.  Contractions: Irritability. Vag. Bleeding: None.  Movement: Present. Denies leaking of fluid.   The following portions of the patient's history were reviewed and updated as appropriate: allergies, current medications, past family history, past medical history, past social history, past surgical history and problem list. Problem list updated.  Objective:   Vitals:   04/04/18 0914  BP: 116/78  Weight: (!) 319 lb 12.8 oz (145.1 kg)    Fetal Status: Fetal Heart Rate (bpm): 150   Movement: Present     General:  Alert, oriented and cooperative. Patient is in no acute distress.  Skin: Skin is warm and dry. No rash noted.   Cardiovascular: Normal heart rate noted  Respiratory: Normal respiratory effort, no problems with respiration noted  Abdomen: Soft, gravid, appropriate for gestational age. Pain/Pressure: Present     Pelvic:  Cervical exam deferred        Extremities: Normal range of motion.  Edema: None  Mental Status: Normal mood and affect. Normal behavior. Normal judgment and thought content.   Urinalysis:      Assessment and Plan:  Pregnancy: [id number] at [redacted]w[redacted]d  1. Supervision of high risk pregnancy, antepartum Stable  2. History of gestational hypertension BP stable Pt has not been taking BASA with pregnancy   3. History of cesarean section Desires TOLAC  4. History of VBAC Successful TOLAc in the past  5. Morbid obesity with body mass index of 45.0-49.9 in adult  (HCC) Wt stable Growth scan later this month  Preterm labor symptoms and general obstetric precautions including but not limited to vaginal bleeding, contractions, leaking of fluid and fetal movement were reviewed in detail with the patient. Please refer to After Visit Summary for other counseling recommendations.  Return in about 2 weeks (around 04/18/2018) for OB visit.   Hermina Staggers, MD

## 2018-04-19 ENCOUNTER — Ambulatory Visit (INDEPENDENT_AMBULATORY_CARE_PROVIDER_SITE_OTHER): Payer: Medicaid Other | Admitting: Certified Nurse Midwife

## 2018-04-19 ENCOUNTER — Other Ambulatory Visit: Payer: Self-pay

## 2018-04-19 ENCOUNTER — Encounter: Payer: Self-pay | Admitting: Certified Nurse Midwife

## 2018-04-19 VITALS — BP 139/84 | HR 103 | Wt 321.0 lb

## 2018-04-19 DIAGNOSIS — Z8759 Personal history of other complications of pregnancy, childbirth and the puerperium: Secondary | ICD-10-CM

## 2018-04-19 DIAGNOSIS — O99213 Obesity complicating pregnancy, third trimester: Secondary | ICD-10-CM

## 2018-04-19 DIAGNOSIS — O0990 Supervision of high risk pregnancy, unspecified, unspecified trimester: Secondary | ICD-10-CM

## 2018-04-19 DIAGNOSIS — Z3A35 35 weeks gestation of pregnancy: Secondary | ICD-10-CM

## 2018-04-19 DIAGNOSIS — O99210 Obesity complicating pregnancy, unspecified trimester: Secondary | ICD-10-CM

## 2018-04-19 DIAGNOSIS — Z98891 History of uterine scar from previous surgery: Secondary | ICD-10-CM

## 2018-04-19 NOTE — Progress Notes (Signed)
   PRENATAL VISIT NOTE  Subjective:  [name] is a 38 y.o. [phone] at 77w0dbeing seen today for ongoing prenatal care.  She is currently monitored for the following issues for this high-risk pregnancy and has Depression with anxiety; Morbid obesity with body mass index of 45.0-49.9 in adult ([facility]; Supervision of high risk pregnancy, antepartum; History of gestational hypertension; History of cesarean section; History of VBAC; and Obesity affecting pregnancy, antepartum on their problem list.  Patient reports edema.  Contractions: Irregular. Vag. Bleeding: None.  Movement: Present. Denies leaking of fluid.   The following portions of the patient's history were reviewed and updated as appropriate: allergies, current medications, past family history, past medical history, past social history, past surgical history and problem list.   Objective:   Vitals:   04/19/18 0949  BP: 139/84  Pulse: (!) 103  Weight: (!) 321 lb (145.6 kg)    Fetal Status: Fetal Heart Rate (bpm): 150   Movement: Present     General:  Alert, oriented and cooperative. Patient is in no acute distress.  Skin: Skin is warm and dry. No rash noted.   Cardiovascular: Normal heart rate noted  Respiratory: Normal respiratory effort, no problems with respiration noted  Abdomen: Soft, gravid, appropriate for gestational age.  Pain/Pressure: Present     Pelvic: Cervical exam deferred        Extremities: Normal range of motion.  Edema: Mild pitting, slight indentation  Mental Status: Normal mood and affect. Normal behavior. Normal judgment and thought content.   Assessment and Plan:  Pregnancy: [id number] 383w0d. Supervision of high risk pregnancy, antepartum - Patient doing well, reports increased edema over the past couple of days  - Anticipatory guidance on upcoming appointments with GBS screening at next appointment   2. History of gestational hypertension - BP elevated in office today: 155/89, with repeat of  139/84 - She denies HA, vision changes or epigastric pain today - Reports she has been having HA that are intermittent and relieved by Tylenol over the past week  - Educated and discussed repeating PEC labs with possible diagnoses of PEC depending on labs. If labs normal and hypertension continues then will be diagnosed with gestational hypertension and induced at 37 weeks. Patient verbalizes understanding.  - CBC - Comp Met (CMET) - Protein / creatinine ratio, urine  3. History of cesarean section - Plans TOLAC, hx of successful VBAC   4. Obesity affecting pregnancy, antepartum - f/u fetal growth [place] for 04/27/2018  Preterm labor symptoms and general obstetric precautions including but not limited to vaginal bleeding, contractions, leaking of fluid and fetal movement were reviewed in detail with the patient. Please refer to After Visit Summary for other counseling recommendations.   Return in about 1 week (around 04/26/2018) for ROB.  Future Appointments  Date Time Provider [place]  2:15 PM DoEmily FilbertMD CWDe [place]  04/27/2018 10:50 AM WH[place]URSE WHRiver RoadFC-US  04/27/2018 11:00 AM [place] WH-MFCUS MFC-US    VeLajean ManesCNM

## 2018-04-20 LAB — PROTEIN / CREATININE RATIO, URINE
Creatinine, Urine: 82 mg/dL
Protein, Ur: 15.4 mg/dL
Protein/Creat Ratio: 188 mg/g creat (ref 0–200)

## 2018-04-20 LAB — COMPREHENSIVE METABOLIC PANEL
ALT: 22 IU/L (ref 0–32)
AST: 16 IU/L (ref 0–40)
Albumin/Globulin Ratio: 1.2 (ref 1.2–2.2)
Albumin: 3.8 g/dL (ref 3.8–4.8)
Alkaline Phosphatase: 129 IU/L — ABNORMAL HIGH (ref 39–117)
BUN/Creatinine Ratio: 8 — ABNORMAL LOW (ref 9–23)
BUN: 5 mg/dL — ABNORMAL LOW (ref 6–20)
Bilirubin Total: 0.5 mg/dL (ref 0.0–1.2)
CO2: 16 mmol/L — ABNORMAL LOW (ref 20–29)
Calcium: 9.8 mg/dL (ref 8.7–10.2)
Chloride: 103 mmol/L (ref 96–106)
Creatinine, Ser: 0.61 mg/dL (ref 0.57–1.00)
GFR calc Af Amer: 136 mL/min/{1.73_m2} (ref >59)
GFR calc non Af Amer: 118 mL/min/{1.73_m2} (ref >59)
Globulin, Total: 3.1 g/dL (ref 1.5–4.5)
Glucose: 109 mg/dL — ABNORMAL HIGH (ref 65–99)
Potassium: 4 mmol/L (ref 3.5–5.2)
Sodium: 138 mmol/L (ref 134–144)
Total Protein: 6.9 g/dL (ref 6.0–8.5)

## 2018-04-20 LAB — CBC
Hematocrit: 34.6 % (ref 34.0–46.6)
Hemoglobin: 10.9 g/dL — ABNORMAL LOW (ref 11.1–15.9)
MCH: 24.6 pg — ABNORMAL LOW (ref 26.6–33.0)
MCHC: 31.5 g/dL (ref 31.5–35.7)
MCV: 78 fL — ABNORMAL LOW (ref 79–97)
Platelets: 297 10*3/uL (ref 150–450)
RBC: 4.43 x10E6/uL (ref 3.77–5.28)
RDW: 14.7 % (ref 11.7–15.4)
WBC: 11.3 10*3/uL — ABNORMAL HIGH (ref 3.4–10.8)

## 2018-04-26 ENCOUNTER — Other Ambulatory Visit: Payer: Self-pay

## 2018-04-26 ENCOUNTER — Other Ambulatory Visit (HOSPITAL_COMMUNITY)
Admission: RE | Admit: 2018-04-26 | Discharge: 2018-04-26 | Disposition: A | Discharge status: Home / Self Care | Payer: Medicaid Other | Source: Ambulatory Visit | Attending: Obstetrics & Gynecology | Admitting: Obstetrics & Gynecology

## 2018-04-26 ENCOUNTER — Ambulatory Visit (INDEPENDENT_AMBULATORY_CARE_PROVIDER_SITE_OTHER): Payer: Medicaid Other | Admitting: Obstetrics & Gynecology

## 2018-04-26 ENCOUNTER — Encounter: Payer: Self-pay | Admitting: Obstetrics & Gynecology

## 2018-04-26 VITALS — BP 127/82 | HR 112 | Wt 325.0 lb

## 2018-04-26 DIAGNOSIS — Z98891 History of uterine scar from previous surgery: Secondary | ICD-10-CM

## 2018-04-26 DIAGNOSIS — O99810 Abnormal glucose complicating pregnancy: Secondary | ICD-10-CM | POA: Insufficient documentation

## 2018-04-26 DIAGNOSIS — Z3A36 36 weeks gestation of pregnancy: Secondary | ICD-10-CM

## 2018-04-26 DIAGNOSIS — O24419 Gestational diabetes mellitus in pregnancy, unspecified control: Secondary | ICD-10-CM

## 2018-04-26 DIAGNOSIS — O0990 Supervision of high risk pregnancy, unspecified, unspecified trimester: Secondary | ICD-10-CM

## 2018-04-26 DIAGNOSIS — O99343 Other mental disorders complicating pregnancy, third trimester: Secondary | ICD-10-CM

## 2018-04-26 DIAGNOSIS — O99213 Obesity complicating pregnancy, third trimester: Secondary | ICD-10-CM

## 2018-04-26 DIAGNOSIS — F418 Other specified anxiety disorders: Secondary | ICD-10-CM

## 2018-04-26 DIAGNOSIS — Z6841 Body Mass Index (BMI) 40.0 and over, adult: Secondary | ICD-10-CM

## 2018-04-26 MED ORDER — GLUCOSE BLOOD VI STRP: ORAL_STRIP | 12 refills | Status: DC

## 2018-04-26 MED ORDER — ACCU-CHEK FASTCLIX LANCETS MISC: 1.0000 | Freq: Four times a day (QID) | 12 refills | Status: DC

## 2018-04-26 MED ORDER — ACCU-CHEK GUIDE ME W/DEVICE KIT: 1.0000 | PACK | Freq: Once | 0 refills | Status: DC

## 2018-04-26 NOTE — Progress Notes (Signed)
Pt is here for ROB. [redacted]w[redacted]d. Pt reports headaches for about a week along with numbness/swelling in hands and feet. Reports good fetal movement.

## 2018-04-26 NOTE — Addendum Note (Signed)
Addended by: Allie Bossier on: 04/26/2018 03:45 PM   Modules accepted: Orders

## 2018-04-26 NOTE — Addendum Note (Signed)
Addended by: Allie Bossier on: 04/26/2018 04:04 PM   Modules accepted: Orders

## 2018-04-26 NOTE — Progress Notes (Addendum)
   PRENATAL VISIT NOTE  Subjective:  [name] is a 38 y.o. [phone] at [redacted]w[redacted]d being seen today for ongoing prenatal care.  She is currently monitored for the following issues for this low-risk pregnancy and has Depression with anxiety; Morbid obesity with body mass index of 45.0-49.9 in adult [facility]); Supervision of high risk pregnancy, antepartum; History of gestational hypertension; History of cesarean section; History of VBAC; and Obesity affecting pregnancy, antepartum on their problem list.  Patient reports no complaints.  Contractions: Irregular. Vag. Bleeding: None.  Movement: Present. Denies leaking of fluid.   The following portions of the patient's history were reviewed and updated as appropriate: allergies, current medications, past family history, past medical history, past social history, past surgical history and problem list.   Objective:   Vitals:   04/26/18 1410 04/26/18 1412  BP: 137/86 127/82  Pulse: (!) 111 (!) 112  Weight: (!) 325 lb (147.4 kg)     Fetal Status: Fetal Heart Rate (bpm): 156   Movement: Present     General:  Alert, oriented and cooperative. Patient is in no acute distress.  Skin: Skin is warm and dry. No rash noted.   Cardiovascular: Normal heart rate noted  Respiratory: Normal respiratory effort, no problems with respiration noted  Abdomen: Soft, gravid, appropriate for gestational age.  Pain/Pressure: Present     Pelvic: Cervical exam performed        Extremities: Normal range of motion.  Edema: Moderate pitting, indentation subsides rapidly  Mental Status: Normal mood and affect. Normal behavior. Normal judgment and thought content.   Assessment and Plan:  Pregnancy: [id number] at [redacted]w[redacted]d 1. Supervision of high risk pregnancy, antepartum  - Strep Gp B NAA - Cervicovaginal ancillary only( [place])  2. Depression with anxiety   3. Morbid obesity with body mass index of 45.0-49.9 in adult (HCC)   4. History of cesarean section - She  wants another VBAC. - We discussed the risks and benefits and she read and signed the consent form today.   Preterm labor symptoms and general obstetric precautions including but not limited to vaginal bleeding, contractions, leaking of fluid and fetal movement were reviewed in detail with the patient. Please refer to After Visit Summary for other counseling recommendations.   No follow-ups on file.  Future Appointments  Date Time Provider Department Center  04/27/2018 10:50 AM WH-MFC NURSE WH-MFC MFC-US  04/27/2018 11:00 AM WH-MFC [place] 3 WH-MFCUS MFC-US    Allie Bossier, MD

## 2018-04-26 NOTE — Progress Notes (Signed)
She told me that she does not have access to a BP cuff.

## 2018-04-27 ENCOUNTER — Encounter (HOSPITAL_COMMUNITY): Payer: Self-pay

## 2018-04-27 ENCOUNTER — Other Ambulatory Visit: Payer: Self-pay

## 2018-04-27 ENCOUNTER — Other Ambulatory Visit (HOSPITAL_COMMUNITY): Payer: Self-pay | Admitting: Maternal & Fetal Medicine

## 2018-04-27 ENCOUNTER — Inpatient Hospital Stay (HOSPITAL_COMMUNITY)
Admission: AD | Admit: 2018-04-27 | Discharge: 2018-04-27 | Disposition: A | Discharge status: Home / Self Care | Payer: Medicaid Other | Attending: Obstetrics and Gynecology | Admitting: Obstetrics and Gynecology

## 2018-04-27 ENCOUNTER — Ambulatory Visit (HOSPITAL_COMMUNITY): Payer: Medicaid Other | Admitting: *Deleted

## 2018-04-27 ENCOUNTER — Ambulatory Visit (HOSPITAL_COMMUNITY)
Admission: RE | Admit: 2018-04-27 | Discharge: 2018-04-27 | Disposition: A | Discharge status: Home / Self Care | Payer: Medicaid Other | Source: Ambulatory Visit | Attending: Obstetrics and Gynecology | Admitting: Obstetrics and Gynecology

## 2018-04-27 VITALS — BP 140/81 | HR 109 | Temp 98.8°F

## 2018-04-27 DIAGNOSIS — O163 Unspecified maternal hypertension, third trimester: Secondary | ICD-10-CM | POA: Insufficient documentation

## 2018-04-27 DIAGNOSIS — Z803 Family history of malignant neoplasm of breast: Secondary | ICD-10-CM | POA: Diagnosis not present

## 2018-04-27 DIAGNOSIS — O34219 Maternal care for unspecified type scar from previous cesarean delivery: Secondary | ICD-10-CM | POA: Diagnosis not present

## 2018-04-27 DIAGNOSIS — O368130 Decreased fetal movements, third trimester, not applicable or unspecified: Secondary | ICD-10-CM | POA: Insufficient documentation

## 2018-04-27 DIAGNOSIS — Z362 Encounter for other antenatal screening follow-up: Secondary | ICD-10-CM | POA: Diagnosis not present

## 2018-04-27 DIAGNOSIS — Z800 Family history of malignant neoplasm of digestive organs: Secondary | ICD-10-CM | POA: Diagnosis not present

## 2018-04-27 DIAGNOSIS — O09293 Supervision of pregnancy with other poor reproductive or obstetric history, third trimester: Secondary | ICD-10-CM | POA: Diagnosis not present

## 2018-04-27 DIAGNOSIS — O09523 Supervision of elderly multigravida, third trimester: Secondary | ICD-10-CM | POA: Diagnosis present

## 2018-04-27 DIAGNOSIS — Z885 Allergy status to narcotic agent status: Secondary | ICD-10-CM | POA: Diagnosis not present

## 2018-04-27 DIAGNOSIS — O99810 Abnormal glucose complicating pregnancy: Secondary | ICD-10-CM

## 2018-04-27 DIAGNOSIS — Z833 Family history of diabetes mellitus: Secondary | ICD-10-CM | POA: Insufficient documentation

## 2018-04-27 DIAGNOSIS — Z801 Family history of malignant neoplasm of trachea, bronchus and lung: Secondary | ICD-10-CM | POA: Diagnosis not present

## 2018-04-27 DIAGNOSIS — O99213 Obesity complicating pregnancy, third trimester: Secondary | ICD-10-CM

## 2018-04-27 DIAGNOSIS — O139 Gestational [pregnancy-induced] hypertension without significant proteinuria, unspecified trimester: Secondary | ICD-10-CM | POA: Diagnosis present

## 2018-04-27 DIAGNOSIS — O99210 Obesity complicating pregnancy, unspecified trimester: Secondary | ICD-10-CM

## 2018-04-27 DIAGNOSIS — R030 Elevated blood-pressure reading, without diagnosis of hypertension: Secondary | ICD-10-CM

## 2018-04-27 DIAGNOSIS — Z3A36 36 weeks gestation of pregnancy: Secondary | ICD-10-CM

## 2018-04-27 DIAGNOSIS — O99343 Other mental disorders complicating pregnancy, third trimester: Secondary | ICD-10-CM | POA: Insufficient documentation

## 2018-04-27 DIAGNOSIS — F418 Other specified anxiety disorders: Secondary | ICD-10-CM | POA: Diagnosis not present

## 2018-04-27 LAB — COMPREHENSIVE METABOLIC PANEL
ALBUMIN: 2.8 g/dL — AB (ref 3.5–5.0)
ALK PHOS: 113 U/L (ref 38–126)
ALT: 21 U/L (ref 0–44)
AST: 22 U/L (ref 15–41)
Anion gap: 10 (ref 5–15)
BILIRUBIN TOTAL: 0.7 mg/dL (ref 0.3–1.2)
BUN: 6 mg/dL (ref 6–20)
CO2: 19 mmol/L — ABNORMAL LOW (ref 22–32)
Calcium: 9.3 mg/dL (ref 8.9–10.3)
Chloride: 106 mmol/L (ref 98–111)
Creatinine, Ser: 0.6 mg/dL (ref 0.44–1.00)
GFR calc Af Amer: >60 mL/min (ref >60)
GFR calc non Af Amer: >60 mL/min (ref >60)
Glucose, Bld: 104 mg/dL — ABNORMAL HIGH (ref 70–99)
Potassium: 3.8 mmol/L (ref 3.5–5.1)
Sodium: 135 mmol/L (ref 135–145)
TOTAL PROTEIN: 6.6 g/dL (ref 6.5–8.1)

## 2018-04-27 LAB — URINALYSIS, ROUTINE W REFLEX MICROSCOPIC
Bilirubin Urine: NEGATIVE
Glucose, UA: 150 mg/dL — AB
Hgb urine dipstick: NEGATIVE
Ketones, ur: NEGATIVE mg/dL
Leukocytes,Ua: NEGATIVE
Nitrite: NEGATIVE
Protein, ur: NEGATIVE mg/dL
SPECIFIC GRAVITY, URINE: 1.023 (ref 1.005–1.030)
pH: 6 (ref 5.0–8.0)

## 2018-04-27 LAB — PROTEIN / CREATININE RATIO, URINE
Creatinine, Urine: 124.19 mg/dL
Protein Creatinine Ratio: 0.14 mg/mg{Cre} (ref 0.00–0.15)
Total Protein, Urine: 18 mg/dL

## 2018-04-27 LAB — CBC
HCT: 34.8 % — ABNORMAL LOW (ref 36.0–46.0)
HEMOGLOBIN: 10.9 g/dL — AB (ref 12.0–15.0)
MCH: 23.9 pg — ABNORMAL LOW (ref 26.0–34.0)
MCHC: 31.3 g/dL (ref 30.0–36.0)
MCV: 76.1 fL — ABNORMAL LOW (ref 80.0–100.0)
Platelets: 289 10*3/uL (ref 150–400)
RBC: 4.57 MIL/uL (ref 3.87–5.11)
RDW: 14.7 % (ref 11.5–15.5)
WBC: 9.5 10*3/uL (ref 4.0–10.5)
nRBC: 0 % (ref 0.0–0.2)

## 2018-04-27 LAB — CERVICOVAGINAL ANCILLARY ONLY
Bacterial vaginitis: NEGATIVE
Candida vaginitis: POSITIVE — AB
Chlamydia: NEGATIVE
Neisseria Gonorrhea: NEGATIVE
Trichomonas: NEGATIVE

## 2018-04-27 MED ORDER — DIPHENHYDRAMINE HCL 50 MG/ML IJ SOLN
12.5000 mg | Freq: Once | INTRAMUSCULAR | Status: DC
  Filled 2018-04-27: qty 1

## 2018-04-27 MED ORDER — METOCLOPRAMIDE HCL 5 MG/ML IJ SOLN
10.0000 mg | Freq: Once | INTRAMUSCULAR | Status: DC
  Filled 2018-04-27: qty 2

## 2018-04-27 MED ORDER — LABETALOL HCL 5 MG/ML IV SOLN: 40.0000 mg | INTRAVENOUS | Status: DC | PRN

## 2018-04-27 MED ORDER — DEXAMETHASONE SODIUM PHOSPHATE 10 MG/ML IJ SOLN
10.0000 mg | Freq: Once | INTRAMUSCULAR | Status: DC
  Filled 2018-04-27: qty 1

## 2018-04-27 MED ORDER — CYCLOBENZAPRINE HCL 10 MG PO TABS: 10.0000 mg | ORAL_TABLET | Freq: Two times a day (BID) | ORAL | 0 refills | Status: DC | PRN

## 2018-04-27 MED ORDER — BUTALBITAL-APAP-CAFFEINE 50-325-40 MG PO TABS
2.0000 | ORAL_TABLET | Freq: Once | ORAL | Status: AC
  Administered 2018-04-27: 2 via ORAL
  Filled 2018-04-27: qty 2

## 2018-04-27 MED ORDER — HYDRALAZINE HCL 20 MG/ML IJ SOLN: 10.0000 mg | INTRAMUSCULAR | Status: DC | PRN

## 2018-04-27 MED ORDER — LACTATED RINGERS IV SOLN
INTRAVENOUS | Status: DC
  Administered 2018-04-27: 13:00:00 via INTRAVENOUS

## 2018-04-27 MED ORDER — LABETALOL HCL 5 MG/ML IV SOLN: 80.0000 mg | INTRAVENOUS | Status: DC | PRN

## 2018-04-27 MED ORDER — LABETALOL HCL 5 MG/ML IV SOLN: 20.0000 mg | INTRAVENOUS | Status: DC | PRN

## 2018-04-27 NOTE — MAU Note (Signed)
Patient was seen today at [facility] and reports that baby wasn't moving much on [place] and recommended that she come here.  Has had HA, blurred vision, epigastric pain, and increased swelling.  Also c/o dizziness and feeling less FM.

## 2018-04-27 NOTE — Discharge Instructions (Signed)
-Return to [facility] on 04-30-2018; I have sent them a message that you will be coming in the morning for a BP check. -Try Flexeril if you develop another headache; keep hydrated and return to MAU if your symptoms change or worsen.   Preeclampsia and Eclampsia  Preeclampsia is a serious condition that may develop during pregnancy. It is also called toxemia of pregnancy. This condition causes high blood pressure along with other symptoms, such as swelling and headaches. These symptoms may develop as the condition gets worse. Preeclampsia may occur at 20 weeks of pregnancy or later. Diagnosing and treating preeclampsia early is very important. If not treated early, it can cause serious problems for you and your baby. One problem it can lead to is eclampsia. Eclampsia is a condition that causes muscle jerking or shaking (convulsions or seizures) and other serious problems for the mother. During pregnancy, delivering your baby may be the best treatment for preeclampsia or eclampsia. For most women, preeclampsia and eclampsia symptoms go away after giving birth. In rare cases, a woman may develop preeclampsia after giving birth (postpartum preeclampsia). This usually occurs within 48 hours after childbirth but may occur up to 6 weeks after giving birth. What are the causes? The cause of preeclampsia is not known. What increases the risk? The following risk factors make you more likely to develop preeclampsia:  Being pregnant for the first time.  Having had preeclampsia during a past pregnancy.  Having a family history of preeclampsia.  Having high blood pressure.  Being pregnant with more than one baby.  Being 58 or older.  Being African-American.  Having kidney disease or diabetes.  Having medical conditions such as lupus or blood diseases.  Being very overweight (obese). What are the signs or symptoms? The earliest signs of preeclampsia are:  High blood pressure.  Increased protein in  your urine. Your health care provider will check for this at every visit before you give birth (prenatal visit). Other symptoms that may develop as the condition gets worse include:  Severe headaches.  Sudden weight gain.  Swelling of the hands, face, legs, and feet.  Nausea and vomiting.  Vision problems, such as blurred or double vision.  Numbness in the face, arms, legs, and feet.  Urinating less than usual.  Dizziness.  Slurred speech.  Abdominal pain, especially upper abdominal pain.  Convulsions or seizures. How is this diagnosed? There are no screening tests for preeclampsia. Your health care provider will ask you about symptoms and check for signs of preeclampsia during your prenatal visits. You may also have tests that include:  Urine tests.  Blood tests.  Checking your blood pressure.  Monitoring your babys heart rate.  Ultrasound. How is this treated? You and your health care provider will determine the treatment approach that is best for you. Treatment may include:  Having more frequent prenatal exams to check for signs of preeclampsia, if you have an increased risk for preeclampsia.  Medicine to lower your blood pressure.  Staying in the hospital, if your condition is severe. There, treatment will focus on controlling your blood pressure and the amount of fluids in your body (fluid retention).  Taking medicine (magnesium sulfate) to prevent seizures. This may be given as an injection or through an IV.  Taking a low-dose aspirin during your pregnancy.  Delivering your baby early, if your condition gets worse. You may have your labor started with medicine (induced), or you may have a cesarean delivery. Follow these instructions at home: Eating  and drinking   Drink enough fluid to keep your urine pale yellow.  Avoid caffeine. Lifestyle  Do not use any products that contain nicotine or tobacco, such as cigarettes and e-cigarettes. If you need help  quitting, ask your health care provider.  Do not use alcohol or drugs.  Avoid stress as much as possible. Rest and get plenty of sleep. General instructions  Take over-the-counter and prescription medicines only as told by your health care provider.  When lying down, lie on your left side. This keeps pressure off your major blood vessels.  When sitting or lying down, raise (elevate) your feet. Try putting some pillows underneath your lower legs.  Exercise regularly. Ask your health care provider what kinds of exercise are best for you.  Keep all follow-up and prenatal visits as told by your health care provider. This is important. How is this prevented? There is no known way of preventing preeclampsia or eclampsia from developing. However, to lower your risk of complications and detect problems early:  Get regular prenatal care. Your health care provider may be able to diagnose and treat the condition early.  Maintain a healthy weight. Ask your health care provider for help managing weight gain during pregnancy.  Work with your health care provider to manage any long-term (chronic) health conditions you have, such as diabetes or kidney problems.  You may have tests of your blood pressure and kidney function after giving birth.  Your health care provider may have you take low-dose aspirin during your next pregnancy. Contact a health care provider if:  You have symptoms that your health care provider told you may require more treatment or monitoring, such as: ? Headaches. ? Nausea or vomiting. ? Abdominal pain. ? Dizziness. ? Light-headedness. Get help right away if:  You have severe: ? Abdominal pain. ? Headaches that do not get better. ? Dizziness. ? Vision problems. ? Confusion. ? Nausea or vomiting.  You have any of the following: ? A seizure. ? Sudden, rapid weight gain. ? Sudden swelling in your hands, ankles, or face. ? Trouble moving any part of your  body. ? Numbness in any part of your body. ? Trouble speaking. ? Abnormal bleeding.  You faint. Summary  Preeclampsia is a serious condition that may develop during pregnancy. It is also called toxemia of pregnancy.  This condition causes high blood pressure along with other symptoms, such as swelling and headaches.  Diagnosing and treating preeclampsia early is very important. If not treated early, it can cause serious problems for you and your baby.  Get help right away if you have symptoms that your health care provider told you to watch for. This information is not intended to replace advice given to you by your health care provider. Make sure you discuss any questions you have with your health care provider. Document Released: 01/15/2000 Document Revised: 01/03/2017 Document Reviewed: 08/24/2015 Elsevier Interactive Patient Education  2019 [employer].

## 2018-04-27 NOTE — MAU Provider Note (Addendum)
History    Patient [name] is a 38 y.o. [phone] At [redacted]w[redacted]d here with complaints of headache, blurry vision, RUQ pain and overall feeling dizzy. She has a history of chronic hypertension; she has not been taking any medicine for it in pregnancy. She denies vaginal bleeding, vaginal discharge, NV, dysuria. She says that she was at [facility] today for her [place] and that she was told to come here for evaluation after she reported decreased fetal movement and feeling unwell.  CSN: [id number]  Arrival date and time: 04/27/18 1142   First Provider Initiated Contact with Patient 04/27/18 1228      Chief Complaint  Patient presents with  . Hypertension  . Decreased Fetal Movement  . Dizziness   HPI Patient has multiple concerning complaints. She states that she has blurry vision in the morning; it comes and goes during the day. She has it "a little bit" right now. This has been going for weeks, ever since the beginning of her third trimester; she knows that her blood pressure is high when it happens. She also says that she has a HA 5/10; it is a dull ache that she has "all day, every day, since the start" of her third trimester. She says that she gets this headache when her BP is high; she does not check her blood pressure at home. She has been taking Tylenol for it at night.  The Tylenol works and then her HA comes back. THe HA is right in the middle of her forehead. It is a dull ache. She has also had pain in her upper right abdomen for two weeks.   Per patient, her blood pressures have been 140-150s/80s; she says that she told her provider about these symptoms yesterday and that she was told to come to MAU if her symptoms got worse. Because she felt like her HA and blurry vision are getting worse, she decided to come in for evaluation after her appt at [facility].   OB History    Gravida  8   Para  4   Term  4   Preterm  0   AB  3   Living  3     SAB  2   TAB  1   Ectopic  0   Multiple  0    Live Births  4           Past Medical History:  Diagnosis Date  . Depression with anxiety 07/04/2013  . Heart murmur   . Morbid obesity (HCC) 03/21/2013  . 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 BREAST SURGERY  2005   L breast infection after loss  . CESAREAN SECTION  02/24/2011   Procedure: CESAREAN SECTION;  Surgeon: Brock Bad, MD;  Location: [facility];  Service: Gynecology;  Laterality: N/A;  Primary, cord ph 7.29    Family History  Problem Relation Age of Onset  . Diabetes Maternal Aunt   . Breast cancer Maternal Aunt        2 maternal aunts with breast cancer diagnosed at 76 and 36 yo  . Cancer Maternal Grandfather        lung  . Colon cancer Maternal Grandfather        died in his 72's.     Social History   Tobacco Use  . Smoking status: Never Smoker  . Smokeless tobacco: Never Used  Substance Use Topics  . Alcohol use: No    Frequency: Never    Comment: occasional, social drinking  . Drug use: No    Allergies:  Allergies  Allergen Reactions  . Codeine Nausea And Vomiting    Per patient, "I cannot take percocet."    Medications Prior to Admission  Medication Sig Dispense Refill Last Dose  . aspirin EC 81 MG tablet Take 1 tablet (81 mg total) by mouth daily. Take after 12 weeks for prevention of preeclampsia later in pregnancy (Patient not taking: Reported on 12/13/2017) 300 tablet 2 Not Taking  . ondansetron (ZOFRAN ODT) 4 MG disintegrating tablet Take 1 tablet (4 mg total) by mouth every 8 (eight) hours as needed for nausea or vomiting. (Patient not taking: Reported on 03/30/2018) 15 tablet 0 Not Taking  . prenatal vitamin w/FE, FA (PRENATAL 1 + 1) 27-1 MG TABS tablet Take 1 tablet by mouth daily at 12 noon.   Taking    Review of Systems  Constitutional: Negative.   HENT: Negative.   Respiratory: Negative.   Gastrointestinal: Positive for abdominal pain.   Genitourinary: Negative for dysuria, pelvic pain, urgency, vaginal bleeding, vaginal discharge and vaginal pain.  Musculoskeletal: Negative.   Neurological: Positive for dizziness and headaches.  Psychiatric/Behavioral: Negative.    Physical Exam   Blood pressure 119/80, pulse 94, temperature 98.3 F (36.8 C), temperature source Oral, resp. rate 20, weight (!) 146.5 kg, last menstrual period 08/17/2017, SpO2 100 %, unknown if currently breastfeeding.   Patient Vitals for the past 24 hrs:  BP Temp Temp src Pulse Resp SpO2 Weight  04/27/18 1600 119/80 - - 94 - 100 % -  04/27/18 1530 125/74 - - 96 - - -  04/27/18 1515 119/76 - - 93 - - -  04/27/18 1500 120/73 - - 89 - - -  04/27/18 1445 120/74 - - 93 - - -  04/27/18 1430 118/77 - - (!) 101 - - -  04/27/18 1415 115/76 - - 92 - 99 % -  04/27/18 1400 117/75 - - 97 - 99 % -  04/27/18 1345 124/76 - - 96 - - -  04/27/18 1330 133/84 - - 98 - - -  04/27/18 1315 127/81 - - (!) 102 - - -  04/27/18 1300 (!) 142/80 - - (!) 101 - - -  04/27/18 1245 (!) 132/91 - - (!) 103 - - -  04/27/18 1230 (!) 150/84 - - 100 - 99 % -  04/27/18 1216 (!) 165/94 98.3 F (36.8 C) Oral (!) 115 20 98 % (!) 146.5 kg    Physical Exam  Constitutional: She is oriented to person, place, and time. She appears well-developed and well-nourished.  HENT:  Head: Normocephalic.  Neck: Normal range of motion.  Respiratory: Effort normal.  GI: Soft.  Musculoskeletal: Normal range of motion.  Neurological: She is alert and oriented to person, place, and time.  Skin: Skin is warm.    MAU Course  Procedures  MDM -Patient alert and oriented x 3 in bed; had one severe range pressure upon admission to MAU but others have been 120-150/80-90. Now at 1500, BPs have been normal.  -Reviewed BPP from this morning, 8/8 BPP.   -IV started immediately, admission labs and pre-e labs were drawn immediately upon patient entering MAU room.  -Fioricet x 2; headache is now a 4/10.  -  CBC, CMP, PCR are all normal.  -NST: 150 bpm, mod var, present acel, neg decels, occasional contraction.  Discussed with Dr. Alysia PennaErvin, who recommends headache cocktail.  1612 Patient declines Headache cocktail, as HA is now 0/10.    Assessment and Plan   1. Transient hypertension   2. Abnormal glucose tolerance in pregnancy   3. Obesity affecting pregnancy, antepartum    2. Patient stable for discharge with pre-e precautions.   3. [facility] and keep follow up appt on Thursday, 4-2. [facility]   4. Will send home with Flexeril RX.   5. Strict return precautions given; Dr. Alysia PennaErvin agrees with plan of care.   Charlesetta GaribaldiKathryn Lorraine  04/27/2018, 4:23 PM

## 2018-04-28 LAB — STREP GP B NAA: Strep Gp B NAA: NEGATIVE

## 2018-04-30 ENCOUNTER — Other Ambulatory Visit (HOSPITAL_COMMUNITY): Payer: Self-pay | Admitting: *Deleted

## 2018-04-30 ENCOUNTER — Ambulatory Visit (INDEPENDENT_AMBULATORY_CARE_PROVIDER_SITE_OTHER): Payer: Medicaid Other

## 2018-04-30 ENCOUNTER — Other Ambulatory Visit: Payer: Self-pay

## 2018-04-30 VITALS — BP 136/86 | HR 89 | Wt 321.6 lb

## 2018-04-30 DIAGNOSIS — Z0130 Encounter for examination of blood pressure without abnormal findings: Secondary | ICD-10-CM

## 2018-04-30 DIAGNOSIS — Z8759 Personal history of other complications of pregnancy, childbirth and the puerperium: Secondary | ICD-10-CM

## 2018-04-30 NOTE — Progress Notes (Signed)
..  Subjective:  [name] is a 38 y.o. female here for BP check.   Hypertension ROS: Pt is complaining of headaches, dizziness and blurred vision, not relived by pain medication. Objective:  BP 136/86   Pulse 89   Wt (!) 321 lb 9.6 oz (145.9 kg)   LMP 08/17/2017 (Approximate)   BMI 51.91 kg/m   Appearance alert, well appearing, and in no distress. General exam BP noted to be well controlled today in office.    Assessment:   Blood Pressure well controlled.   Plan:  Provider advised pt to be evaluated at MAU.

## 2018-04-30 NOTE — Progress Notes (Signed)
I have reviewed the chart and agree with nursing staff's documentation of this patient's encounter.  Catalina Antigua, MD 04/30/2018 4:18 PM

## 2018-05-03 ENCOUNTER — Inpatient Hospital Stay (HOSPITAL_COMMUNITY)
Admission: AD | Admit: 2018-05-03 | Discharge: 2018-05-05 | DRG: 807 | Disposition: A | Discharge status: Home / Self Care | Payer: Medicaid Other | Attending: Obstetrics & Gynecology | Admitting: Obstetrics & Gynecology

## 2018-05-03 ENCOUNTER — Encounter (HOSPITAL_COMMUNITY): Payer: Self-pay

## 2018-05-03 ENCOUNTER — Other Ambulatory Visit: Payer: Self-pay

## 2018-05-03 ENCOUNTER — Ambulatory Visit (INDEPENDENT_AMBULATORY_CARE_PROVIDER_SITE_OTHER): Payer: Medicaid Other | Admitting: Obstetrics and Gynecology

## 2018-05-03 ENCOUNTER — Encounter: Payer: Self-pay | Admitting: Obstetrics and Gynecology

## 2018-05-03 VITALS — BP 143/89 | HR 101 | Wt 323.5 lb

## 2018-05-03 DIAGNOSIS — Z6841 Body Mass Index (BMI) 40.0 and over, adult: Secondary | ICD-10-CM

## 2018-05-03 DIAGNOSIS — O139 Gestational [pregnancy-induced] hypertension without significant proteinuria, unspecified trimester: Secondary | ICD-10-CM | POA: Diagnosis present

## 2018-05-03 DIAGNOSIS — F418 Other specified anxiety disorders: Secondary | ICD-10-CM | POA: Diagnosis present

## 2018-05-03 DIAGNOSIS — O134 Gestational [pregnancy-induced] hypertension without significant proteinuria, complicating childbirth: Secondary | ICD-10-CM | POA: Diagnosis present

## 2018-05-03 DIAGNOSIS — O1490 Unspecified pre-eclampsia, unspecified trimester: Secondary | ICD-10-CM | POA: Diagnosis present

## 2018-05-03 DIAGNOSIS — Z8759 Personal history of other complications of pregnancy, childbirth and the puerperium: Secondary | ICD-10-CM

## 2018-05-03 DIAGNOSIS — Z98891 History of uterine scar from previous surgery: Secondary | ICD-10-CM

## 2018-05-03 DIAGNOSIS — O99344 Other mental disorders complicating childbirth: Secondary | ICD-10-CM | POA: Diagnosis present

## 2018-05-03 DIAGNOSIS — O133 Gestational [pregnancy-induced] hypertension without significant proteinuria, third trimester: Secondary | ICD-10-CM

## 2018-05-03 DIAGNOSIS — O99210 Obesity complicating pregnancy, unspecified trimester: Secondary | ICD-10-CM

## 2018-05-03 DIAGNOSIS — O99214 Obesity complicating childbirth: Secondary | ICD-10-CM | POA: Diagnosis present

## 2018-05-03 DIAGNOSIS — O99810 Abnormal glucose complicating pregnancy: Secondary | ICD-10-CM | POA: Diagnosis present

## 2018-05-03 DIAGNOSIS — Z3A37 37 weeks gestation of pregnancy: Secondary | ICD-10-CM | POA: Diagnosis not present

## 2018-05-03 DIAGNOSIS — O0990 Supervision of high risk pregnancy, unspecified, unspecified trimester: Secondary | ICD-10-CM

## 2018-05-03 DIAGNOSIS — O34219 Maternal care for unspecified type scar from previous cesarean delivery: Secondary | ICD-10-CM | POA: Diagnosis present

## 2018-05-03 DIAGNOSIS — O99213 Obesity complicating pregnancy, third trimester: Secondary | ICD-10-CM

## 2018-05-03 LAB — CBC
HCT: 35.6 % — ABNORMAL LOW (ref 36.0–46.0)
Hemoglobin: 11.3 g/dL — ABNORMAL LOW (ref 12.0–15.0)
MCH: 24.1 pg — ABNORMAL LOW (ref 26.0–34.0)
MCHC: 31.7 g/dL (ref 30.0–36.0)
MCV: 75.9 fL — ABNORMAL LOW (ref 80.0–100.0)
Platelets: 276 10*3/uL (ref 150–400)
RBC: 4.69 MIL/uL (ref 3.87–5.11)
RDW: 14.8 % (ref 11.5–15.5)
WBC: 10.2 10*3/uL (ref 4.0–10.5)
nRBC: 0 % (ref 0.0–0.2)

## 2018-05-03 LAB — COMPREHENSIVE METABOLIC PANEL
ALT: 24 U/L (ref 0–44)
AST: 21 U/L (ref 15–41)
Albumin: 3 g/dL — ABNORMAL LOW (ref 3.5–5.0)
Alkaline Phosphatase: 139 U/L — ABNORMAL HIGH (ref 38–126)
Anion gap: 8 (ref 5–15)
BUN: 8 mg/dL (ref 6–20)
CO2: 21 mmol/L — ABNORMAL LOW (ref 22–32)
Calcium: 9.8 mg/dL (ref 8.9–10.3)
Chloride: 106 mmol/L (ref 98–111)
Creatinine, Ser: 0.77 mg/dL (ref 0.44–1.00)
GFR calc Af Amer: >60 mL/min (ref >60)
GFR calc non Af Amer: >60 mL/min (ref >60)
Glucose, Bld: 81 mg/dL (ref 70–99)
Potassium: 4.2 mmol/L (ref 3.5–5.1)
Sodium: 135 mmol/L (ref 135–145)
Total Bilirubin: 0.7 mg/dL (ref 0.3–1.2)
Total Protein: 6.8 g/dL (ref 6.5–8.1)

## 2018-05-03 LAB — PROTEIN / CREATININE RATIO, URINE
Creatinine, Urine: 70.68 mg/dL
Total Protein, Urine: <6 mg/dL

## 2018-05-03 LAB — TYPE AND SCREEN
ABO/RH(D): B POS
Antibody Screen: NEGATIVE

## 2018-05-03 MED ORDER — ONDANSETRON HCL 4 MG/2ML IJ SOLN: 4.0000 mg | Freq: Four times a day (QID) | INTRAMUSCULAR | Status: DC | PRN

## 2018-05-03 MED ORDER — MISOPROSTOL 25 MCG QUARTER TABLET: 25.0000 ug | ORAL_TABLET | ORAL | Status: DC | PRN

## 2018-05-03 MED ORDER — OXYTOCIN 40 UNITS IN NORMAL SALINE INFUSION - SIMPLE MED: 1.0000 m[IU]/min | INTRAVENOUS | Status: DC

## 2018-05-03 MED ORDER — OXYTOCIN BOLUS FROM INFUSION
500.0000 mL | Freq: Once | INTRAVENOUS | Status: AC
  Administered 2018-05-04: 500 mL via INTRAVENOUS

## 2018-05-03 MED ORDER — LACTATED RINGERS IV SOLN
500.0000 mL | INTRAVENOUS | Status: DC | PRN
  Administered 2018-05-04: 500 mL via INTRAVENOUS

## 2018-05-03 MED ORDER — LIDOCAINE HCL (PF) 1 % IJ SOLN: 30.0000 mL | INTRAMUSCULAR | Status: DC | PRN

## 2018-05-03 MED ORDER — OXYTOCIN 40 UNITS IN NORMAL SALINE INFUSION - SIMPLE MED
1.0000 m[IU]/min | INTRAVENOUS | Status: DC
  Administered 2018-05-03: 1 m[IU]/min via INTRAVENOUS

## 2018-05-03 MED ORDER — OXYCODONE-ACETAMINOPHEN 5-325 MG PO TABS: 1.0000 | ORAL_TABLET | ORAL | Status: DC | PRN

## 2018-05-03 MED ORDER — OXYCODONE-ACETAMINOPHEN 5-325 MG PO TABS: 2.0000 | ORAL_TABLET | ORAL | Status: DC | PRN

## 2018-05-03 MED ORDER — TERBUTALINE SULFATE 1 MG/ML IJ SOLN: 0.2500 mg | Freq: Once | INTRAMUSCULAR | Status: DC | PRN

## 2018-05-03 MED ORDER — OXYTOCIN 40 UNITS IN NORMAL SALINE INFUSION - SIMPLE MED
2.5000 [IU]/h | INTRAVENOUS | Status: DC
  Filled 2018-05-03: qty 1000

## 2018-05-03 MED ORDER — SOD CITRATE-CITRIC ACID 500-334 MG/5ML PO SOLN: 30.0000 mL | ORAL | Status: DC | PRN

## 2018-05-03 MED ORDER — ACETAMINOPHEN 325 MG PO TABS: 650.0000 mg | ORAL_TABLET | ORAL | Status: DC | PRN

## 2018-05-03 MED ORDER — LACTATED RINGERS IV SOLN
INTRAVENOUS | Status: DC
  Administered 2018-05-03 – 2018-05-04 (×2): via INTRAVENOUS

## 2018-05-03 NOTE — Progress Notes (Signed)
OB/[facility]N Faculty Practice: Labor Progress Note  Subjective: Doing well, comfortable and not really feeling contractions. Husband Darren in room.   Objective: BP 134/75 (BP Location: Right Arm)   Pulse (!) 103   Temp 97.9 F (36.6 C) (Axillary)   Resp 16   Ht 5\' 6"  (1.676 m)   Wt (!) 146.7 kg   LMP 08/17/2017 (Approximate)   BMI 52.21 kg/m  Gen: well-appearing, NAD Dilation: 3.5 Effacement (%): 50 Cervical Position: Posterior Station: Ballotable Presentation: Vertex Exam by:: Mary [place] Johnson, RN  Assessment and Plan: 38 y.o. [phone] [redacted]w[redacted]d here for IOL for gestational HTN.   Labor: Induction started with FB today. Pitocin started when FB out, will adjust to titrate 2x2. TOLAC consent previously signed, patient with VBAC afterwards.  -- pain control: desires epidural -- PPH Risk: medium   Fetal Well-Being: EFW 8lbs by [place]. Cephalic by sutures.  -- Category I -  Continuous fetal monitoring  -- GBS negative   gHTN: Moderate range pressures.  UPC undetectable. CBC and CMP wnl. Asymptomatic.  -- continue to monitor pressures   Daisja Kessinger S. Earlene Plater, DO OB/GYN Fellow, Faculty Practice  9:40 PM

## 2018-05-03 NOTE — Progress Notes (Addendum)
   PRENATAL VISIT NOTE  Subjective:  [name] is a 38 y.o. [phone] at [redacted]w[redacted]d being seen today for ongoing prenatal care.  She is currently monitored for the following issues for this high-risk pregnancy and has Depression with anxiety; Morbid obesity with body mass index of 45.0-49.9 in adult [facility]); Gestational hypertension; Supervision of high risk pregnancy, antepartum; History of gestational hypertension; History of cesarean section; History of VBAC; Obesity affecting pregnancy, antepartum; and Abnormal glucose tolerance in pregnancy on their problem list.  Patient reports headaches, spots in vision.  Contractions: Irregular. Vag. Bleeding: None.  Movement: Present. Denies leaking of fluid.   The following portions of the patient's history were reviewed and updated as appropriate: allergies, current medications, past family history, past medical history, past social history, past surgical history and problem list.   Objective:   Vitals:   05/03/18 1342  BP: (!) 143/89  Pulse: (!) 101  Weight: (!) 323 lb 8 oz (146.7 kg)    Fetal Status: Fetal Heart Rate (bpm): 154   Movement: Present     General:  Alert, oriented and cooperative. Patient is in no acute distress.  Skin: Skin is warm and dry. No rash noted.   Cardiovascular: Normal heart rate noted  Respiratory: Normal respiratory effort, no problems with respiration noted  Abdomen: Soft, gravid, appropriate for gestational age.  Pain/Pressure: Present     Pelvic: Cervical exam deferred        Extremities: Normal range of motion.  Edema: Mild pitting, slight indentation  Mental Status: Normal mood and affect. Normal behavior. Normal judgment and thought content.   Assessment and Plan:  Pregnancy: [id number] at [redacted]w[redacted]d  1. Supervision of high risk pregnancy, antepartum  2. History of gestational hypertension Has been having BP checks  3. History of cesarean section Planning for TOLAC  4. History of VBAC  5. Morbid obesity  with body mass index of 45.0-49.9 in adult (HCC)  6. Abnormal glucose tolerance in pregnancy - Elevated fasting, has not had diabetic counseling  7. Gestational hypertension, third trimester - Now meets criteria for gestational hypertension - recommended present to [facility] for induction of labor, she is agreeable - staff at [facility] aware  TO [facility] FOR IOL  Term labor symptoms and general obstetric precautions including but not limited to vaginal bleeding, contractions, leaking of fluid and fetal movement were reviewed in detail with the patient. Please refer to After Visit Summary for other counseling recommendations.   Return for post partum check.  Future Appointments  Date Time Provider Department Center  05/04/2018 10:00 AM [facility] [place] 3 [facility]  05/04/2018 10:15 AM [facility] NURSE [facility]    Conan Bowens, MD

## 2018-05-03 NOTE — H&P (Addendum)
[name] is a 38 y.o. female [phone] with IUP at [redacted]w[redacted]d presenting for IOL for GHTN. Pt states she has been having none contractions, associated with none vaginal bleeding for no hours..  Membranes are intact, with active fetal movement.   [facility] at [facility] since 12  Wks.   -Patient has small headache and blurry vision, but she has had this for the past 3 months. She is very clear that these symptoms have been going on since 30 weeks. She takes tylenol nightly for her headaches. She has an RX for fioricet but has not used it.   Prenatal History/Complications: -History of gestational hypertension in past pregnancies; newly diagnosed with GHTN now.  H/o 16 week loss with multiple anomalies and loss of infant at 2 months of life -No history of gestational diabetes in this pregnancy or prior pregnancies,, although patient had borderline fasting of 92 in her 2 hour glucola in February.  -EFW was 85%  on 3-27 Past Medical History: Past Medical History:  Diagnosis Date  . Depression with anxiety 07/04/2013  . Heart murmur   . Morbid obesity (HCC) 03/21/2013  . Postpartum depression    After fetal loss in 2005 due to anomalies    Past Surgical History: Past Surgical History:  Procedure Laterality Date  . BREAST SURGERY  2005   L breast infection after loss  . CESAREAN SECTION  02/24/2011   Procedure: CESAREAN SECTION;  Surgeon: Brock Bad, MD;  Location: [facility];  Service: Gynecology;  Laterality: N/A;  Primary, cord ph 7.29    Obstetrical History: OB History    Gravida  8   Para  4   Term  4   Preterm  0   AB  3   Living  3     SAB  2   TAB  1   Ectopic  0   Multiple  0   Live Births  4            Social History: Social History   Socioeconomic History  . Marital status: Married    Spouse name: Engineer, petroleum  . Number of children: 3  . Years of education: Not on file  . Highest education level: Not on file  Occupational History  . Not on file   Social Needs  . Financial resource strain: Not hard at all  . Food insecurity:    Worry: Never true    Inability: Never true  . Transportation needs:    Medical: No    Non-medical: No  Tobacco Use  . Smoking status: Never Smoker  . Smokeless tobacco: Never Used  Substance and Sexual Activity  . Alcohol use: No    Frequency: Never    Comment: occasional, social drinking  . Drug use: No  . Sexual activity: Yes    Birth control/protection: None  Lifestyle  . Physical activity:    Days per week: 0 days    Minutes per session: 0 min  . Stress: Only a little  Relationships  . Social connections:    Talks on phone: Three times a week    Gets together: Three times a week    Attends religious service: 1 to 4 times per year    Active member of club or organization: No    Attends meetings of clubs or organizations: Never    Relationship status: Married  Other Topics Concern  . Not on file  Social History Narrative  . Not on file  Family History: Family History  Problem Relation Age of Onset  . Diabetes Maternal Aunt   . Breast cancer Maternal Aunt        2 maternal aunts with breast cancer diagnosed at 3530 and 36 yo  . Cancer Maternal Grandfather        lung  . Colon cancer Maternal Grandfather        died in his 860's.     Allergies: Allergies  Allergen Reactions  . Codeine Nausea And Vomiting    Per patient, "I cannot take percocet."    Medications Prior to Admission  Medication Sig Dispense Refill Last Dose  . acetaminophen (TYLENOL) 325 MG tablet Take 650 mg by mouth every 6 (six) hours as needed.   05/03/2018 at Unknown time  . cyclobenzaprine (FLEXERIL) 10 MG tablet Take 1 tablet (10 mg total) by mouth 2 (two) times daily as needed for muscle spasms. 30 tablet 0 Past Month at Unknown time  . prenatal vitamin w/FE, FA (PRENATAL 1 + 1) 27-1 MG TABS tablet Take 1 tablet by mouth daily at 12 noon.   05/03/2018 at Unknown time  . aspirin EC 81 MG tablet Take 1 tablet  (81 mg total) by mouth daily. Take after 12 weeks for prevention of preeclampsia later in pregnancy (Patient not taking: Reported on 12/13/2017) 300 tablet 2 More than a month at Unknown time  . ondansetron (ZOFRAN ODT) 4 MG disintegrating tablet Take 1 tablet (4 mg total) by mouth every 8 (eight) hours as needed for nausea or vomiting. (Patient not taking: Reported on 03/30/2018) 15 tablet 0 More than a month at Unknown time        Review of Systems   Constitutional: Negative for fever and chills Eyes: Negative for visual disturbances Respiratory: Negative for shortness of breath, dyspnea Cardiovascular: Negative for chest pain or palpitations  Gastrointestinal: Negative for vomiting, diarrhea and constipation.  POSITIVE for abdominal pain (contractions) Genitourinary: Negative for dysuria and urgency Musculoskeletal: Negative for back pain, joint pain, myalgias  Neurological: Negative for dizziness and headaches      Blood pressure (!) 141/90, pulse 89, height 5\' 6"  (1.676 m), weight (!) 146.7 kg, last menstrual period 08/17/2017, unknown if currently breastfeeding. General appearance: alert, cooperative and no distress Lungs: clear to auscultation bilaterally Heart: regular rate and rhythm Abdomen: soft, non-tender; bowel sounds normal Extremities: Homans sign is negative, no sign of DVT DTR's 2+ Presentation: cephalic Fetal monitoring  Baseline: 145 bpm Uterine activity  None Dilation: 1.5 Effacement (%): 50 Station: Ballotable Exam by:: Luna KitchensKathryn , CNM   Prenatal labs: ABO, Rh: --/--/B POS (04/02 1605) Antibody: NEG (04/02 1605) Rubella: <0.90 (09/17 1649) RPR: Non Reactive (02/05 1050)  HBsAg: Negative (09/17 1649)  HIV: Non Reactive (02/05 1050)  GBS: Negative (03/26 0224)  1 hr Glucola patient had 2 hour gtt of 92, 152, 89  Genetic screening  normal Anatomy US normal female  Prenatal Transfer Tool  Maternal Diabetes: No Genetic Screening:  Normal Maternal Ultrasounds/Referrals: Normal Fetal Ultrasounds or other Referrals:  None Maternal Substance Abuse:  No Significant Maternal Medications:  None Significant Maternal Lab Results: None     Results for orders placed or performed during the hospital encounter of 05/03/18 (from the past 24 hour(s))  Comprehensive metabolic panel   Collection Time: 05/03/18  4:00 PM  Result Value Ref Range   Sodium 135 135 - 145 mmol/L   Potassium 4.2 3.5 - 5.1 mmol/L   Chloride 106 98 - 111 mmol/L  CO2 21 (L) 22 - 32 mmol/L   Glucose, Bld 81 70 - 99 mg/dL   BUN 8 6 - 20 mg/dL   Creatinine, Ser 0.01 0.44 - 1.00 mg/dL   Calcium 9.8 8.9 - 74.9 mg/dL   Total Protein 6.8 6.5 - 8.1 g/dL   Albumin 3.0 (L) 3.5 - 5.0 g/dL   AST 21 15 - 41 U/L   ALT 24 0 - 44 U/L   Alkaline Phosphatase 139 (H) 38 - 126 U/L   Total Bilirubin 0.7 0.3 - 1.2 mg/dL   GFR calc non Af Amer >60 >60 mL/min   GFR calc Af Amer >60 >60 mL/min   Anion gap 8 5 - 15  CBC   Collection Time: 05/03/18  4:00 PM  Result Value Ref Range   WBC 10.2 4.0 - 10.5 K/uL   RBC 4.69 3.87 - 5.11 MIL/uL   Hemoglobin 11.3 (L) 12.0 - 15.0 g/dL   HCT 44.9 (L) 67.5 - 91.6 %   MCV 75.9 (L) 80.0 - 100.0 fL   MCH 24.1 (L) 26.0 - 34.0 pg   MCHC 31.7 30.0 - 36.0 g/dL   RDW 38.4 66.5 - 99.3 %   Platelets 276 150 - 400 K/uL   nRBC 0.0 0.0 - 0.2 %  Type and screen [facility]   Collection Time: 05/03/18  4:05 PM  Result Value Ref Range   ABO/RH(D) B POS    Antibody Screen NEG    Sample Expiration      05/06/2018 Performed at [facility], [address] [place], [place] [address]   Protein / creatinine ratio, urine   Collection Time: 05/03/18  4:30 PM  Result Value Ref Range   Creatinine, Urine 70.68 mg/dL   Total Protein, Urine <6 mg/dL   Protein Creatinine Ratio        0.00 - 0.15 mg/mg[Cre]    Assessment: [name] is a 38 y.o. [phone] with an IUP at [redacted]w[redacted]d presenting for IOL for gestational  hypertension, newly diagnosed today.  -FB placed without incident and will start low-dose pitocin.  Plan: #Labor: expectant management #Pain:  Per request #FWB Cat 1 #ID: GBS: negative #MOF:  breast #MOC: husband planning vasectomy #Circ: na (girl)   Marylene Land 05/03/2018, 7:17 PM

## 2018-05-04 ENCOUNTER — Ambulatory Visit (HOSPITAL_COMMUNITY): Payer: Medicaid Other

## 2018-05-04 ENCOUNTER — Encounter (HOSPITAL_COMMUNITY): Payer: Self-pay

## 2018-05-04 ENCOUNTER — Inpatient Hospital Stay (HOSPITAL_COMMUNITY): Payer: Medicaid Other | Admitting: Anesthesiology

## 2018-05-04 DIAGNOSIS — Z3A37 37 weeks gestation of pregnancy: Secondary | ICD-10-CM

## 2018-05-04 DIAGNOSIS — O134 Gestational [pregnancy-induced] hypertension without significant proteinuria, complicating childbirth: Secondary | ICD-10-CM

## 2018-05-04 LAB — CBC
HCT: 34.2 % — ABNORMAL LOW (ref 36.0–46.0)
HCT: 33 % — ABNORMAL LOW (ref 36.0–46.0)
Hemoglobin: 10.9 g/dL — ABNORMAL LOW (ref 12.0–15.0)
Hemoglobin: 10.3 g/dL — ABNORMAL LOW (ref 12.0–15.0)
MCH: 24.1 pg — ABNORMAL LOW (ref 26.0–34.0)
MCH: 23.6 pg — ABNORMAL LOW (ref 26.0–34.0)
MCHC: 31.9 g/dL (ref 30.0–36.0)
MCHC: 31.2 g/dL (ref 30.0–36.0)
MCV: 75.5 fL — ABNORMAL LOW (ref 80.0–100.0)
MCV: 75.7 fL — ABNORMAL LOW (ref 80.0–100.0)
Platelets: 281 10*3/uL (ref 150–400)
Platelets: 245 10*3/uL (ref 150–400)
RBC: 4.53 MIL/uL (ref 3.87–5.11)
RBC: 4.36 MIL/uL (ref 3.87–5.11)
RDW: 14.9 % (ref 11.5–15.5)
RDW: 14.9 % (ref 11.5–15.5)
WBC: 11.4 10*3/uL — ABNORMAL HIGH (ref 4.0–10.5)
WBC: 11 10*3/uL — ABNORMAL HIGH (ref 4.0–10.5)
nRBC: 0 % (ref 0.0–0.2)
nRBC: 0 % (ref 0.0–0.2)

## 2018-05-04 LAB — ABO/RH: ABO/RH(D): B POS

## 2018-05-04 LAB — RPR: RPR Ser Ql: NONREACTIVE

## 2018-05-04 MED ORDER — FENTANYL-BUPIVACAINE-NACL 0.5-0.125-0.9 MG/250ML-% EP SOLN
12.0000 mL/h | EPIDURAL | Status: DC | PRN
  Filled 2018-05-04: qty 250

## 2018-05-04 MED ORDER — EPHEDRINE 5 MG/ML INJ: 10.0000 mg | INTRAVENOUS | Status: DC | PRN

## 2018-05-04 MED ORDER — PRENATAL MULTIVITAMIN CH
1.0000 | ORAL_TABLET | Freq: Every day | ORAL | Status: DC
  Administered 2018-05-04 – 2018-05-05 (×2): 1 via ORAL
  Filled 2018-05-04 (×2): qty 1

## 2018-05-04 MED ORDER — PHENYLEPHRINE 40 MCG/ML (10ML) SYRINGE FOR IV PUSH (FOR BLOOD PRESSURE SUPPORT)
80.0000 ug | PREFILLED_SYRINGE | INTRAVENOUS | Status: DC | PRN
  Administered 2018-05-04: 80 ug via INTRAVENOUS
  Filled 2018-05-04: qty 10

## 2018-05-04 MED ORDER — DIPHENHYDRAMINE HCL 25 MG PO CAPS: 25.0000 mg | ORAL_CAPSULE | Freq: Four times a day (QID) | ORAL | Status: DC | PRN

## 2018-05-04 MED ORDER — FENTANYL CITRATE (PF) 100 MCG/2ML IJ SOLN
100.0000 ug | INTRAMUSCULAR | Status: DC | PRN
  Administered 2018-05-04 (×4): 100 ug via INTRAVENOUS
  Filled 2018-05-04 (×4): qty 2

## 2018-05-04 MED ORDER — TETANUS-DIPHTH-ACELL PERTUSSIS 5-2.5-18.5 LF-MCG/0.5 IM SUSP: 0.5000 mL | Freq: Once | INTRAMUSCULAR | Status: DC

## 2018-05-04 MED ORDER — LACTATED RINGERS IV SOLN: 500.0000 mL | Freq: Once | INTRAVENOUS | Status: DC

## 2018-05-04 MED ORDER — SENNOSIDES-DOCUSATE SODIUM 8.6-50 MG PO TABS
2.0000 | ORAL_TABLET | ORAL | Status: DC
  Administered 2018-05-04: 2 via ORAL
  Filled 2018-05-04: qty 2

## 2018-05-04 MED ORDER — WITCH HAZEL-GLYCERIN EX PADS: 1.0000 "application " | MEDICATED_PAD | CUTANEOUS | Status: DC | PRN

## 2018-05-04 MED ORDER — SIMETHICONE 80 MG PO CHEW: 80.0000 mg | CHEWABLE_TABLET | ORAL | Status: DC | PRN

## 2018-05-04 MED ORDER — DIBUCAINE 1 % RE OINT: 1.0000 "application " | TOPICAL_OINTMENT | RECTAL | Status: DC | PRN

## 2018-05-04 MED ORDER — DIPHENHYDRAMINE HCL 50 MG/ML IJ SOLN: 12.5000 mg | INTRAMUSCULAR | Status: DC | PRN

## 2018-05-04 MED ORDER — ZOLPIDEM TARTRATE 5 MG PO TABS: 5.0000 mg | ORAL_TABLET | Freq: Every evening | ORAL | Status: DC | PRN

## 2018-05-04 MED ORDER — MEASLES, MUMPS & RUBELLA VAC IJ SOLR: 0.5000 mL | Freq: Once | INTRAMUSCULAR | Status: DC

## 2018-05-04 MED ORDER — BENZOCAINE-MENTHOL 20-0.5 % EX AERO: 1.0000 "application " | INHALATION_SPRAY | CUTANEOUS | Status: DC | PRN

## 2018-05-04 MED ORDER — SODIUM CHLORIDE (PF) 0.9 % IJ SOLN
INTRAMUSCULAR | Status: DC | PRN
  Administered 2018-05-04: 12 mL/h via EPIDURAL

## 2018-05-04 MED ORDER — IBUPROFEN 600 MG PO TABS
600.0000 mg | ORAL_TABLET | Freq: Four times a day (QID) | ORAL | Status: DC
  Administered 2018-05-04 – 2018-05-05 (×6): 600 mg via ORAL
  Filled 2018-05-04 (×6): qty 1

## 2018-05-04 MED ORDER — ONDANSETRON HCL 4 MG/2ML IJ SOLN: 4.0000 mg | INTRAMUSCULAR | Status: DC | PRN

## 2018-05-04 MED ORDER — ACETAMINOPHEN 325 MG PO TABS
650.0000 mg | ORAL_TABLET | ORAL | Status: DC | PRN
  Administered 2018-05-04 – 2018-05-05 (×3): 650 mg via ORAL
  Filled 2018-05-04 (×3): qty 2

## 2018-05-04 MED ORDER — LIDOCAINE-EPINEPHRINE (PF) 2 %-1:200000 IJ SOLN
INTRAMUSCULAR | Status: DC | PRN
  Administered 2018-05-04: 2 mL via EPIDURAL
  Administered 2018-05-04: 5 mL via EPIDURAL

## 2018-05-04 MED ORDER — ONDANSETRON HCL 4 MG PO TABS: 4.0000 mg | ORAL_TABLET | ORAL | Status: DC | PRN

## 2018-05-04 MED ORDER — COCONUT OIL OIL: 1.0000 "application " | TOPICAL_OIL | Status: DC | PRN

## 2018-05-04 NOTE — Lactation Note (Signed)
This note was copied from a baby's chart. Lactation Consultation Note Add to note of consult: Noted mom coughed a couple of times into her arm as LC on the way out of rm. Reported to RN. Patient Name: Girl Tearsa Sock WCHEN'I Date: 05/04/2018 Reason for consult: Initial assessment;Early term 37-38.6wks   Maternal Data Has patient been taught Hand Expression?: Yes Does the patient have breastfeeding experience prior to this delivery?: Yes  Feeding Feeding Type: Breast Fed  LATCH Score       Type of Nipple: Everted at rest and after stimulation  Comfort (Breast/Nipple): Soft / non-tender        Interventions Interventions: Breast feeding basics reviewed;Position options;Hand express  Lactation Tools Discussed/Used WIC Program: Yes   Consult Status Consult Status: Follow-up Date: 05/04/18(mom would like to be seen before d/c home) Follow-up type: In-patient    Charyl Dancer 05/04/2018, 8:20 PM

## 2018-05-04 NOTE — Anesthesia Preprocedure Evaluation (Signed)
Anesthesia Evaluation  Patient identified by MRN, date of birth, ID band Patient awake    Reviewed: Allergy & Precautions, H&P , NPO status , Patient's Chart, lab work & pertinent test results  History of Anesthesia Complications Negative for: history of anesthetic complications  Airway Mallampati: II  TM Distance: >3 FB Neck ROM: full    Dental no notable dental hx.    Pulmonary neg pulmonary ROS,    Pulmonary exam normal        Cardiovascular negative cardio ROS Normal cardiovascular exam Rhythm:regular Rate:Normal     Neuro/Psych PSYCHIATRIC DISORDERS Anxiety Depression negative neurological ROS     GI/Hepatic negative GI ROS, Neg liver ROS,   Endo/Other  Morbid obesity  Renal/GU negative Renal ROS  negative genitourinary   Musculoskeletal   Abdominal   Peds  Hematology negative hematology ROS (+)   Anesthesia Other Findings   Reproductive/Obstetrics (+) Pregnancy                             Anesthesia Physical Anesthesia Plan  ASA: III  Anesthesia Plan: Epidural   Post-op Pain Management:    Induction:   PONV Risk Score and Plan:   Airway Management Planned:   Additional Equipment:   Intra-op Plan:   Post-operative Plan:   Informed Consent: I have reviewed the patients History and Physical, chart, labs and discussed the procedure including the risks, benefits and alternatives for the proposed anesthesia with the patient or authorized representative who has indicated his/her understanding and acceptance.       Plan Discussed with:   Anesthesia Plan Comments:         Anesthesia Quick Evaluation  

## 2018-05-04 NOTE — Progress Notes (Signed)
OB/GYN Faculty Practice: Labor Progress Note  Subjective: Doing well, trying to get some sleep. Feeling slightly more pressure.   Objective: BP 136/81   Pulse 92   Temp 97.9 F (36.6 C) (Oral)   Resp 18   Ht 5\' 6"  (1.676 m)   Wt (!) 146.7 kg   LMP 08/17/2017 (Approximate)   BMI 52.21 kg/m  Gen: well-appearing, NAD Dilation: 3.5 Effacement (%): 50 Cervical Position: Posterior Station: Ballotable Presentation: Vertex Exam by:: Mary [place] Johnson, RN  Assessment and Plan: 38 y.o. [phone] [redacted]w[redacted]d here for IOL for gestational HTN.   Labor: Continuing with pitocin titration, having regular contractions. Plan to check within next few hours and AROM if able.  -- pain control: desires epidural -- PPH Risk: medium   Fetal Well-Being: EFW 8lbs by Leopolds. Cephalic by sutures.  -- Category I -  Continuous fetal monitoring  -- GBS negative   gHTN: Moderate range pressures.  UPC undetectable. CBC and CMP wnl. Asymptomatic.  -- continue to monitor pressures   Lugene Hitt S. Earlene Plater, DO OB/GYN Fellow, Faculty Practice  3:40 AM

## 2018-05-04 NOTE — Discharge Summary (Signed)
Obstetrics Discharge Summary OB/GYN Faculty Practice   Patient Name: [name] DOB: 1980/10/23 MRN: [id number]  Date of admission: 05/03/2018 Delivering MD: Tamera Stands   Date of discharge: 05/05/2018  Admitting diagnosis: 37wks, high bp Intrauterine pregnancy: [redacted]w[redacted]d     Secondary diagnosis:   Principal Problem:   Gestational hypertension Active Problems:   Depression with anxiety   Morbid obesity with body mass index of 45.0-49.9 in adult [facility])   History of gestational hypertension   History of cesarean section   History of VBAC   Obesity affecting pregnancy, antepartum   Abnormal glucose tolerance in pregnancy   Indication for care in labor or delivery   Discharge diagnosis: VBAC and GHTN                                          Postpartum procedures: None  Complications: None  Outpatient Follow-Up: [ ]  BP check  Hospital course: [name] is a 38 y.o. [redacted]w[redacted]d who was admitted for induction of labor for gestational hypertension. Her pregnancy was complicated by above noted. Her labor course was notable for induction with FB and pitocin, SROM, epidural placement. Delivery was uncomplicated. Please see delivery/op note for additional details. Her postpartum course was uncomplicated. She denied headache, vision changes, abdominal pain, and her blood pressure was  140/81 on day of discharge. She was breastfeeding without difficulty. By day of discharge, she was passing flatus, urinating, eating and drinking without difficulty. Her pain was well-controlled, and she was discharged home with Norvasc 5 mg daily. She will follow-up in clinic in 4 weeks.   Physical exam  Vitals:   05/04/18 2159 05/04/18 2326 05/05/18 0530 05/05/18 1600  BP: 136/74 124/87 129/89 140/81  Pulse: 78 88 88 88  Resp: 20 18 18    Temp: 98.3 F (36.8 C) 98 F (36.7 C) 98.2 F (36.8 C)   TempSrc: Oral Oral Oral   SpO2: 100% 99%    Weight:      Height:       General: General appearance - alert,  well appearing, and in no distress, oriented to person, place, and time and acyanotic, in no respiratory distress Lochia: appropriate Uterine Fundus: firm Incision: N/A DVT Evaluation: No evidence of DVT seen on physical exam.  Labs: Lab Results  Component Value Date   WBC 11.0 (H) 05/04/2018   HGB 10.3 (L) 05/04/2018   HCT 33.0 (L) 05/04/2018   MCV 75.7 (L) 05/04/2018   PLT 245 05/04/2018   CMP Latest Ref Rng & Units 05/03/2018  Glucose 70 - 99 mg/dL 81  BUN 6 - 20 mg/dL 8  Creatinine 0.34 - 0.35 mg/dL 2.48  Sodium 185 - 909 mmol/L 135  Potassium 3.5 - 5.1 mmol/L 4.2  Chloride 98 - 111 mmol/L 106  CO2 22 - 32 mmol/L 21(L)  Calcium 8.9 - 10.3 mg/dL 9.8  Total Protein 6.5 - 8.1 g/dL 6.8  Total Bilirubin 0.3 - 1.2 mg/dL 0.7  Alkaline Phos 38 - 126 U/L 139(H)  AST 15 - 41 U/L 21  ALT 0 - 44 U/L 24    Discharge instructions: Per After Visit Summary and "Baby and Me Booklet"  After visit meds:  Allergies as of 05/05/2018      Reactions   Codeine Nausea And Vomiting   Per patient, "I cannot take percocet."      Medication List    STOP  taking these medications   aspirin EC 81 MG tablet   cyclobenzaprine 10 MG tablet Commonly known as:  FLEXERIL   ondansetron 4 MG disintegrating tablet Commonly known as:  Zofran ODT     TAKE these medications   acetaminophen 325 MG tablet Commonly known as:  TYLENOL Take 650 mg by mouth every 6 (six) hours as needed.   amLODipine 5 MG tablet Commonly known as:  NORVASC Take 1 tablet (5 mg total) by mouth daily.   ibuprofen 600 MG tablet Commonly known as:  ADVIL,MOTRIN Take 1 tablet (600 mg total) by mouth every 6 (six) hours.   norethindrone 0.35 MG tablet Commonly known as:  MICRONOR,CAMILA,ERRIN Take 1 tablet (0.35 mg total) by mouth daily. Start 3 or more weeks after your delivery.   prenatal vitamin w/FE, FA 27-1 MG Tabs tablet Take 1 tablet by mouth daily at 12 noon.       Postpartum contraception: Progesterone  only pills Diet: Routine Diet Activity: Advance as tolerated. Pelvic rest for 6 weeks.   Follow-up Appt: No future appointments.Please schedule this patient for Postpartum visit in: 4 weeks with the following provider: Any provider High risk pregnancy complicated by: gHTN Delivery mode:  VBAC Anticipated Birth Control:  vasectomy PP Procedures needed: BP check  Schedule Integrated BH visit: no  Newborn Data: Live born female  Birth Weight:   APGAR: 8, 9  Newborn Delivery   Birth date/time:  05/04/2018 08:24:00 Delivery type:  VBAC, Spontaneous     Baby Feeding: Breast Disposition:home with mother  Cristal Deer. Earlene Plater, DO OB/GYN Fellow, Faculty Practice   Sharen Counter, CNM

## 2018-05-04 NOTE — Anesthesia Postprocedure Evaluation (Signed)
Anesthesia Post Note  Patient: [name]  Procedure(s) Performed: AN AD HOC LABOR EPIDURAL     Patient location during evaluation: Mother Baby Anesthesia Type: Epidural Level of consciousness: awake, awake and alert and oriented Pain management: pain level controlled Vital Signs Assessment: post-procedure vital signs reviewed and stable Respiratory status: spontaneous breathing and respiratory function stable Cardiovascular status: blood pressure returned to baseline Postop Assessment: no headache, no backache, epidural receding, patient able to bend at knees, no apparent nausea or vomiting, adequate PO intake and able to ambulate Anesthetic complications: no    Last Vitals:  Vitals:   05/04/18 1015 05/04/18 1115  BP: (!) 128/59 124/86  Pulse: 90 97  Resp: 20 20  Temp: 37.6 C 36.7 C  SpO2:      Last Pain:  Vitals:   05/04/18 1115  TempSrc: Axillary  PainSc: 4    Pain Goal: Patients Stated Pain Goal: 8 (05/04/18 0004)                 Cleda Clarks

## 2018-05-04 NOTE — Anesthesia Procedure Notes (Signed)
Epidural Patient location during procedure: OB Start time: 05/04/2018 7:15 AM End time: 05/04/2018 7:28 AM  Staffing Anesthesiologist: Lucretia Kern, MD Performed: anesthesiologist   Preanesthetic Checklist Completed: patient identified, pre-op evaluation, timeout performed, IV checked, risks and benefits discussed and monitors and equipment checked  Epidural Patient position: sitting Prep: DuraPrep Patient monitoring: heart rate, continuous pulse ox and blood pressure Approach: midline Location: L3-L4 Injection technique: LOR air  Needle:  Needle type: Tuohy  Needle gauge: 17 G Needle length: 9 cm Needle insertion depth: 9 cm Catheter type: closed end flexible Catheter size: 19 Gauge Catheter at skin depth: 14 cm Test dose: negative and 2% lidocaine with Epi 1:200 K  Assessment Events: blood not aspirated, injection not painful, no injection resistance, negative IV test and no paresthesia  Additional Notes Reason for block:procedure for pain

## 2018-05-04 NOTE — Lactation Note (Addendum)
This note was copied from a baby's chart. Lactation Consultation Note Baby 11 hrs old. Mom states baby feeding well. Baby took a long nap, mom attempted to feed but was to sleepy, then started feeding well again. Mom stated baby doesn't open her mouth wide sometimes. LC made suggestions of how to stimulate baby to open wider. Mom is experienced BF. Mom demonstrated hand expression w/colostrum noted from everted nipples.  Mom states having bad cramps w/BF. Explained reasoning. Reviewed newborn feeding habits, STS,I&O, supply and demand. Encouraged mom to un-wrap baby and BF STS. Room very warm. Encouraged mom to wake baby if hasn't cued in 3 hrs. Discussed milk storage, pumping, milk coming in, engorgement, and breast care. Mom states no further questions. Would like LC to come back to see a latch before d/c. Mom hopes to have early d/c. Encouraged mom to call for Latch. Mom has DEBP at home. Mom has WIC. Left brochure at bedside.\  Patient Name: [name] SWNIO'E Date: 05/04/2018     Maternal Data    Feeding Feeding Type: Breast Fed  LATCH Score                   Interventions    Lactation Tools Discussed/Used     Consult Status      Charyl Dancer 05/04/2018, 8:14 PM

## 2018-05-05 MED ORDER — AMLODIPINE BESYLATE 5 MG PO TABS
5.0000 mg | ORAL_TABLET | Freq: Every day | ORAL | Status: DC
  Administered 2018-05-05: 5 mg via ORAL
  Filled 2018-05-05: qty 1

## 2018-05-05 MED ORDER — IBUPROFEN 600 MG PO TABS: 600.0000 mg | ORAL_TABLET | Freq: Four times a day (QID) | ORAL | 0 refills | Status: DC

## 2018-05-05 MED ORDER — AMLODIPINE BESYLATE 5 MG PO TABS: 5.0000 mg | ORAL_TABLET | Freq: Every day | ORAL | 2 refills | Status: DC

## 2018-05-05 MED ORDER — NORETHINDRONE 0.35 MG PO TABS: 1.0000 | ORAL_TABLET | Freq: Every day | ORAL | 11 refills | Status: DC

## 2018-05-05 NOTE — Progress Notes (Signed)
Post Partum Day 1 s/p VBAC after IOL for GHTN  Subjective: No complaints, up ad lib, voiding, tolerating PO and + flatus.  Objective: Blood pressure 129/89, pulse 88, temperature 98.2 F (36.8 C), temperature source Oral, resp. rate 18, height 5\' 6"  (1.676 m), weight (!) 146.7 kg, last menstrual period 08/17/2017, SpO2 99 %, unknown if currently breastfeeding.  Vitals:   05/04/18 1929 05/04/18 2159 05/04/18 2326 05/05/18 0530  BP: 126/89 136/74 124/87 129/89  Pulse: 87 78 88 88  Resp: 20 20 18 18   Temp: 98.4 F (36.9 C) 98.3 F (36.8 C) 98 F (36.7 C) 98.2 F (36.8 C)  TempSrc: Oral Oral Oral Oral  SpO2: 100% 100% 99%   Weight:      Height:       Physical Exam:  General: alert and no distress Lochia: appropriate Uterine Fundus: firm DVT Evaluation: No evidence of DVT seen on physical exam. Negative Homan's sign. No significant calf/ankle edema.  Recent Labs    05/04/18 0132 05/04/18 0928  HGB 10.9* 10.3*  HCT 34.2* 33.0*   Assessment/Plan: Plan for discharge tomorrow, Breastfeeding and Contraception Progestin only pills Monitor BP   LOS: 2 days   Jaynie Collins, MD 05/05/2018, 8:12 AM

## 2018-05-05 NOTE — Lactation Note (Signed)
This note was copied from a baby's chart. Lactation Consultation Note  Patient Name: [name] RAJHH'I Date: 05/05/2018 - at 0930  Reason for consult: Follow-up assessment;Early term 37-38.6wks;Infant weight loss  4% weight loss today / Bili at 21 hours old 6.5  Per mom having some soreness on the left nipple and she showed LC / LC noted a small  Intact blister/ nipple tissue appears healthy.  LC recommended prior to every latch until the soreness improves - breast massage , hand  Express, pre-pump with a hand pump if needed and reverse pressure exercise as shown.  Mom reports baby is swallowing a lot and is feeding well.  Mom asked what jaundice is and LC explained.  Since this is not moms 1st baby - LC explained to mom her volume potentially can be greater and  Her let down quicker. If her milk  Is in / breast are over full express off the fullness and reverse pressure.  Nutritive vs non - nutritive feeding patterns and to watch for hanging out latched.     Maternal Data Has patient been taught Hand Expression?: Yes  Feeding Feeding Type: Breast Fed  LATCH Score                   Interventions Interventions: Breast feeding basics reviewed;Hand pump  Lactation Tools Discussed/Used Tools: Pump Breast pump type: Manual Pump Review: Milk Storage   Consult Status Consult Status: Follow-up Date: 05/06/18 Follow-up type: In-patient    Matilde Sprang Jabir Dahlem 05/05/2018, 12:29 PM

## 2018-05-05 NOTE — Lactation Note (Signed)
This note was copied from a baby's chart. Lactation Consultation Note  Patient Name: [name] EHMCN'O Date: 05/05/2018 Reason for consult: Follow-up assessment  2nd LC visit today to check a latch. Mom was able to latch the  Baby independently . LC checked latch / FISH lips and swallows/  Increased with breast compressions and still feeding/ per mom comfortable.  Per mom has DEBP at home.  Mom mentioned the baby was cluster feeding so she asked the RN for bottle.  Mom aware the BFSG is on hold for now/ the [facility] O/P reopen 4/13, the [facility] phone  Line is available.    Maternal Data    Feeding Feeding Type: Breast Fed  LATCH Score  Latch: Grasps breast easily, tongue down, lips flanged, rhythmical sucking.  Audible Swallowing: Spontaneous and intermittent  Type of Nipple: Everted at rest and after stimulation  Comfort (Breast/Nipple): Soft / non-tender  Hold (Positioning): No assistance needed to correctly position infant at breast.  LATCH Score: 10  Interventions Interventions: Breast feeding basics reviewed  Lactation Tools Discussed/Used     Consult Status Consult Status: Complete Date: 05/05/18    Kathrin Greathouse 05/05/2018, 3:17 PM

## 2018-06-04 ENCOUNTER — Encounter: Payer: Self-pay | Admitting: Obstetrics and Gynecology

## 2018-06-04 ENCOUNTER — Ambulatory Visit (INDEPENDENT_AMBULATORY_CARE_PROVIDER_SITE_OTHER): Payer: Medicaid Other | Admitting: Obstetrics and Gynecology

## 2018-06-04 ENCOUNTER — Other Ambulatory Visit: Payer: Self-pay

## 2018-06-04 DIAGNOSIS — Z1389 Encounter for screening for other disorder: Secondary | ICD-10-CM | POA: Diagnosis not present

## 2018-06-04 NOTE — Progress Notes (Signed)
   TELEHEALTH [facility] GYNECOLOGY VISIT ENCOUNTER NOTE  I connected with [name] on 06/04/18 at 10:45 AM EDT by WebEx at home and verified that I am speaking with the correct person using two identifiers.   I discussed the limitations, risks, security and privacy concerns of performing an evaluation and management service by telephone and the availability of in person appointments. I also discussed with the patient that there may be a patient responsible charge related to this service. The patient expressed understanding and agreed to proceed.        Post Partum Exam  [name] is a 38 y.o. [id number] female who presents for a postpartum visit. She is 4 weeks postpartum following a spontaneous vaginal delivery. I have fully reviewed the prenatal and intrapartum course. The delivery was at 37 gestational weeks.  Anesthesia: epidural. Postpartum course has been well. Baby's course has been well. Baby is feeding by breast. Bleeding staining only. Bowel function is normal. Bladder function is normal. Patient is not sexually active. Contraception method is vasectomy (after COVID-19). Postpartum depression screening:neg    Last pap smear done 08/17/2015 and was Normal  Review of Systems Pertinent items are noted in HPI.    Physical Exam:   General:  Alert, oriented and cooperative. Patient appears to be in no acute distress.  Mental Status: Normal mood and affect. Normal behavior. Normal judgment and thought content.   Respiratory: Normal respiratory effort, no problems with respiration noted  Rest of physical exam deferred due to type of encounter        Assessment:    Normal postpartum exam. Pap smear not done at today's visit.   Plan:   1. Contraception: oral progesterone-only contraceptive until husband has vasectomy 2. Patient is medically cleared to resume all activities of daily living. Patient is no longer taking Norvasc and reports normal BP. Patient is due for pap smear in  2020 3. Follow up in: 6 months or as needed.    I discussed the assessment and treatment plan with the patient. The patient was provided an opportunity to ask questions and all were answered. The patient agreed with the plan and demonstrated an understanding of the instructions.   The patient was advised to call back or seek an in-person evaluation/go to the ED if the symptoms worsen or if the condition fails to improve as anticipated.  I provided 15 minutes of face-to-face via WebEx time during this encounter.   Catalina Antigua, MD [facility] [employer]

## 2018-08-28 ENCOUNTER — Ambulatory Visit (INDEPENDENT_AMBULATORY_CARE_PROVIDER_SITE_OTHER): Payer: Medicaid Other | Admitting: Family Medicine

## 2018-08-28 ENCOUNTER — Encounter: Payer: Self-pay | Admitting: Family Medicine

## 2018-08-28 ENCOUNTER — Other Ambulatory Visit (HOSPITAL_COMMUNITY)
Admission: RE | Admit: 2018-08-28 | Discharge: 2018-08-28 | Disposition: A | Discharge status: Home / Self Care | Payer: Medicaid Other | Source: Ambulatory Visit | Attending: Family Medicine | Admitting: Family Medicine

## 2018-08-28 ENCOUNTER — Other Ambulatory Visit: Payer: Self-pay

## 2018-08-28 VITALS — BP 110/80 | HR 95 | Temp 98.9°F | Ht 66.0 in | Wt 300.0 lb

## 2018-08-28 DIAGNOSIS — Z124 Encounter for screening for malignant neoplasm of cervix: Secondary | ICD-10-CM | POA: Diagnosis present

## 2018-08-28 DIAGNOSIS — S66922D Laceration of unspecified muscle, fascia and tendon at wrist and hand level, left hand, subsequent encounter: Secondary | ICD-10-CM

## 2018-08-28 DIAGNOSIS — S61412D Laceration without foreign body of left hand, subsequent encounter: Secondary | ICD-10-CM | POA: Diagnosis not present

## 2018-08-28 DIAGNOSIS — Z0000 Encounter for general adult medical examination without abnormal findings: Secondary | ICD-10-CM

## 2018-08-28 DIAGNOSIS — M654 Radial styloid tenosynovitis [de Quervain]: Secondary | ICD-10-CM | POA: Diagnosis not present

## 2018-08-28 MED ORDER — SPLINT WRIST BRACE/LEFT-RIGHT MISC: 1.0000 | 0 refills | Status: DC | PRN
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
## 2018-08-29 LAB — CBC WITH DIFFERENTIAL/PLATELET
Basophils Absolute: 0 10*3/uL (ref 0.0–0.2)
Basos: 0 %
EOS (ABSOLUTE): 0.1 10*3/uL (ref 0.0–0.4)
Eos: 2 %
Hematocrit: 35.3 % (ref 34.0–46.6)
Hemoglobin: 11.5 g/dL (ref 11.1–15.9)
Immature Grans (Abs): 0 10*3/uL (ref 0.0–0.1)
Immature Granulocytes: 0 %
Lymphocytes Absolute: 3.6 10*3/uL — ABNORMAL HIGH (ref 0.7–3.1)
Lymphs: 50 %
MCH: 24.7 pg — ABNORMAL LOW (ref 26.6–33.0)
MCHC: 32.6 g/dL (ref 31.5–35.7)
MCV: 76 fL — ABNORMAL LOW (ref 79–97)
Monocytes Absolute: 0.3 10*3/uL (ref 0.1–0.9)
Monocytes: 4 %
Neutrophils Absolute: 3.2 10*3/uL (ref 1.4–7.0)
Neutrophils: 44 %
Platelets: 244 10*3/uL (ref 150–450)
RBC: 4.66 x10E6/uL (ref 3.77–5.28)
RDW: 14.1 % (ref 11.7–15.4)
WBC: 7.3 10*3/uL (ref 3.4–10.8)

## 2018-08-29 LAB — COMPREHENSIVE METABOLIC PANEL
ALT: 26 IU/L (ref 0–32)
AST: 19 IU/L (ref 0–40)
Albumin/Globulin Ratio: 1.7 (ref 1.2–2.2)
Albumin: 4.7 g/dL (ref 3.8–4.8)
Alkaline Phosphatase: 118 IU/L — ABNORMAL HIGH (ref 39–117)
BUN/Creatinine Ratio: 11 (ref 9–23)
BUN: 12 mg/dL (ref 6–20)
Bilirubin Total: 0.5 mg/dL (ref 0.0–1.2)
CO2: 21 mmol/L (ref 20–29)
Calcium: 9.8 mg/dL (ref 8.7–10.2)
Chloride: 106 mmol/L (ref 96–106)
Creatinine, Ser: 1.08 mg/dL — ABNORMAL HIGH (ref 0.57–1.00)
GFR calc Af Amer: 77 mL/min/{1.73_m2} (ref >59)
GFR calc non Af Amer: 67 mL/min/{1.73_m2} (ref >59)
Globulin, Total: 2.7 g/dL (ref 1.5–4.5)
Glucose: 95 mg/dL (ref 65–99)
Potassium: 4.1 mmol/L (ref 3.5–5.2)
Sodium: 143 mmol/L (ref 134–144)
Total Protein: 7.4 g/dL (ref 6.0–8.5)

## 2018-08-30 DIAGNOSIS — M654 Radial styloid tenosynovitis [de Quervain]: Secondary | ICD-10-CM | POA: Insufficient documentation

## 2018-08-30 LAB — CYTOLOGY - PAP
Chlamydia: NEGATIVE
Diagnosis: NEGATIVE
HPV: NOT DETECTED
Neisseria Gonorrhea: NEGATIVE

## 2018-08-30 NOTE — Progress Notes (Signed)
   HPI 38 year old female who presents for annual wellness visit.  Patient also complaining of right wrist pain.  Patient requesting Pap smear.  Needs Tdap booster.  Discussed with patient who opted to defer.  Patient's wrist pain is located lateral aspect of her right wrist.  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  States that she is been taking some ibuprofen has not really helped.   CC: Annual physical, right wrist pain   ROS:   Review of Systems See HPI for ROS.   CC, SH/smoking status, and VS noted  Objective: BP 110/80   Pulse 95   Temp 98.9 F (37.2 C) (Oral)   Ht 5\' 6"  (1.676 m)   Wt 300 lb (136.1 kg)   SpO2 98%   BMI 48.42 kg/m  Gen: 38 year old African-American female, no acute distress, comfortable HEENT: EOMI, PERRLA.  No cervical lymphadenopathy CV: RRR, no murmur Resp: CTAB, no wheezes, non-labored Neuro: Alert and oriented, Speech clear, No gross deficits Right wrist: No asymmetry appreciated.  Tenderness to palpation on lateral aspect of right wrist inferior to thenar muscle.  Positive Finkelstein test.  Negative grind test.   Assessment and plan:  De Quervain's tenosynovitis Patient with exam, history consistent with de Quervain's tenosynovitis.  Will give thumb spica splint for next 4 weeks.  Also advised her to take ibuprofen 400 mg 3 times daily as needed for next 3 to 4 weeks.  Annual physical exam Exam without any abnormalities aside from right wrist.  Pap smear performed results pending.  CBC, CMP drawn.  Needs Tdap, patient defers at this time.   Orders Placed This Encounter  Procedures  . CBC with Differential  . Comprehensive metabolic panel    Order Specific Question:   Has the patient fasted?    Answer:   No  . Ambulatory referral to Hand Surgery    Referral Priority:   Routine    Referral Type:   Surgical    Referral Reason:   Specialty Services Required    Requested Specialty:   Hand Surgery    Number of Visits  Requested:   1    Meds ordered this encounter  Medications  . Elastic Bandages & Supports (SPLINT WRIST BRACE/LEFT-RIGHT) MISC    Sig: 1 each by Does not apply route as needed.    Dispense:  1 each    Refill:  0    Patient needs a thumb spica splint     Guadalupe Dawn MD PGY-3 Family Medicine Resident  08/30/2018 11:33 AM

## 2018-08-30 NOTE — Assessment & Plan Note (Signed)
Patient with exam, history consistent with de Quervain's tenosynovitis.  Will give thumb spica splint for next 4 weeks.  Also advised her to take ibuprofen 400 mg 3 times daily as needed for next 3 to 4 weeks.

## 2018-08-30 NOTE — Assessment & Plan Note (Signed)
Exam without any abnormalities aside from right wrist.  Pap smear performed results pending.  CBC, CMP drawn.  Needs Tdap, patient defers at this time.

## 2019-04-08 ENCOUNTER — Other Ambulatory Visit: Payer: Self-pay

## 2019-04-08 ENCOUNTER — Encounter: Payer: Self-pay | Admitting: Family Medicine

## 2019-04-08 ENCOUNTER — Ambulatory Visit (INDEPENDENT_AMBULATORY_CARE_PROVIDER_SITE_OTHER): Payer: Medicaid Other | Admitting: Family Medicine

## 2019-04-08 VITALS — BP 128/84 | HR 105 | Wt 304.0 lb

## 2019-04-08 DIAGNOSIS — N179 Acute kidney failure, unspecified: Secondary | ICD-10-CM

## 2019-04-08 DIAGNOSIS — B351 Tinea unguium: Secondary | ICD-10-CM

## 2019-04-08 DIAGNOSIS — M79674 Pain in right toe(s): Secondary | ICD-10-CM | POA: Diagnosis not present

## 2019-04-08 MED ORDER — TIZANIDINE HCL 4 MG PO TABS: 4.0000 mg | ORAL_TABLET | Freq: Four times a day (QID) | ORAL | 0 refills | Status: DC | PRN

## 2019-04-08 MED ORDER — TERBINAFINE HCL 1 % EX CREA: 1.0000 "application " | TOPICAL_CREAM | Freq: Two times a day (BID) | CUTANEOUS | 1 refills | Status: DC

## 2019-04-08 NOTE — Progress Notes (Signed)
   CHIEF COMPLAINT / HPI:  Right great toe pain: Patient reports that for the past few months she has had pain in her right great toe.  States that over the past few weeks it has become worse.  She states that it hurts when she puts pressure on her toe.  She denies any known trauma to the area.  She denies any history of gout.  She states that she has not noticed any swelling in her toe.  She is able to walk only with it.  Low back pain with muscle spasms: Patient reports that over the past few weeks she has had significant low back pain with some muscle spasms that is causing her to have difficulty sleeping.  She states that it feels better after her husband massages her back.  She denies any known trauma to the area.  She denies any weakness, numbness, tingling.  She denies any bowel or bladder incontinence.  Denies any fevers or chills.  States that she is able to continue taking care of her child and go to work as usual but it is quite uncomfortable.  She has not tried any medications for this.  PERTINENT  PMH / PSH: Depression  OBJECTIVE: BP 128/84   Pulse (!) 105   Wt (!) 304 lb (137.9 kg)   SpO2 99%   Breastfeeding Yes   BMI 49.07 kg/m   General: NAD, pleasant Neck: Supple, no LAD Respiratory: normal work of breathing Neuro: CN II-XII grossly intact Psych: AOx3, appropriate affect Foot, R: TTP over right great toenailNo visible erythema, swelling, ecchymosis, or bony deformity. No notable pes planus/cavus deformity. Transverse arch grossly intact; Range of motion is full in all directions. Strength is 5/5 in all directions. No gross gait abnormalities.   ASSESSMENT / PLAN:  Great toe pain, right No concern for gout or ingrown toenail based on physical exam.  Patient with thickened toenail that is tender when pressure is applied. Pain likely due to overgrown nail.  May be onychomycosis but patient currently breast-feeding and not candidate for terbinafine.  Given terbinafine cream  for her to use and advised that patient do soaks and Epson salt.  Patient also requesting to be seen by podiatry.  Referral placed today.   Low back pain Patient with no red flag signs on exam today.  She is requesting medication for her muscle spasms that she is experiencing.  Given that she is breast-feeding advised patient that she should continue with treatment not involving medications as there is risk for it to be int he breastmilk.  Advised that she continue to have massages as well as use heating pad and topical cream such as Voltaren gel for the pain.  Patient requesting something for the muscle spasms and voiced understanding that  [place] Vickey Ewbank, DO PGY-3, [facility]

## 2019-04-08 NOTE — Patient Instructions (Signed)
Thank you for coming to see me today. It was a pleasure! Today we talked about:   For your toe pain, I have placed a referral for podiatry.  I believe that this is due to you having a thick toenail on that side.  You could try soaking your foot with Epson salt 3-4 times per day and then applying the terbinafine cream applied on top of the toenail.  For your muscle spasms and back pain we can try the tizanidine as discussed.  While you are continuing to breast-feed, it is not known if this medicine is in breastmilk so as we discussed I suggest "pumping and dumping".  It may be best to pump at least to 3 hours after taking this medication.  Please follow-up with Dr. Primitivo Gauze regarding your back pain.  Please continue with massages and heating pads in order to help as this does not require taking any medications.  Please follow-up with Dr. Primitivo Gauze as needed.  If you have any questions or concerns, please do not hesitate to call the office at [phone].  Take Care,   [place] Segler, DO

## 2019-04-09 LAB — HEMOGLOBIN A1C
Est. average glucose Bld gHb Est-mCnc: 114 mg/dL
Hgb A1c MFr Bld: 5.6 % (ref 4.8–5.6)

## 2019-04-09 LAB — BASIC METABOLIC PANEL
BUN/Creatinine Ratio: 16 (ref 9–23)
BUN: 14 mg/dL (ref 6–20)
CO2: 21 mmol/L (ref 20–29)
Calcium: 9.5 mg/dL (ref 8.7–10.2)
Chloride: 105 mmol/L (ref 96–106)
Creatinine, Ser: 0.89 mg/dL (ref 0.57–1.00)
GFR calc Af Amer: 96 mL/min/{1.73_m2} (ref >59)
GFR calc non Af Amer: 84 mL/min/{1.73_m2} (ref >59)
Glucose: 121 mg/dL — ABNORMAL HIGH (ref 65–99)
Potassium: 4.4 mmol/L (ref 3.5–5.2)
Sodium: 141 mmol/L (ref 134–144)

## 2019-04-10 DIAGNOSIS — M79674 Pain in right toe(s): Secondary | ICD-10-CM | POA: Insufficient documentation

## 2019-04-10 NOTE — Assessment & Plan Note (Addendum)
No concern for gout or ingrown toenail based on physical exam.  Patient with thickened toenail that is tender when pressure is applied. Pain likely due to overgrown nail.  May be onychomycosis but patient currently breast-feeding and not candidate for terbinafine.  Given terbinafine cream for her to use and advised that patient do soaks and Epson salt.  Patient also requesting to be seen by podiatry.  Referral placed today.

## 2019-04-19 ENCOUNTER — Ambulatory Visit: Payer: Medicaid Other | Admitting: Podiatry

## 2020-12-30 ENCOUNTER — Ambulatory Visit: Payer: Medicaid Other | Admitting: Podiatry

## 2021-01-13 ENCOUNTER — Other Ambulatory Visit: Payer: Self-pay

## 2021-01-13 ENCOUNTER — Ambulatory Visit (INDEPENDENT_AMBULATORY_CARE_PROVIDER_SITE_OTHER): Payer: Medicaid Other | Admitting: Podiatry

## 2021-01-13 DIAGNOSIS — L600 Ingrowing nail: Secondary | ICD-10-CM | POA: Diagnosis not present

## 2021-01-13 MED ORDER — GENTAMICIN SULFATE 0.1 % EX CREA: 1.0000 "application " | TOPICAL_CREAM | Freq: Two times a day (BID) | CUTANEOUS | 1 refills | Status: DC

## 2021-01-13 NOTE — Progress Notes (Signed)
° °  Subjective: Patient presents today for evaluation of pain to the right great toe. Patient is concerned for possible ingrown nail.  It is very sensitive to touch.  Patient presents today for further treatment and evaluation.  Past Medical History:  Diagnosis Date   Depression with anxiety 07/04/2013   Heart murmur    Morbid obesity (HCC) 03/21/2013   Postpartum depression    After fetal loss in 2005 due to anomalies    Objective:  General: Well developed, nourished, in no acute distress, alert and oriented x3   Dermatology: Skin is warm, dry and supple bilateral.  Medial border right great toe appears to be erythematous with evidence of an ingrowing nail. Pain on palpation noted to the border of the nail fold. The remaining nails appear unremarkable at this time. There are no open sores, lesions.  Vascular: Dorsalis Pedis artery and Posterior Tibial artery pedal pulses palpable. No lower extremity edema noted.   Neruologic: Grossly intact via light touch bilateral.  Musculoskeletal: Muscular strength within normal limits in all groups bilateral. Normal range of motion noted to all pedal and ankle joints.   Assesement: #1 Paronychia with ingrowing nail medial border right great toe #2 Pain in toe  Plan of Care:  1. Patient evaluated.  2. Discussed treatment alternatives and plan of care. Explained nail avulsion procedure and post procedure course to patient. 3. Patient opted for permanent partial nail avulsion of the ingrown portion of the nail.  4. Prior to procedure, local anesthesia infiltration utilized using 3 ml of a 50:50 mixture of 2% plain lidocaine and 0.5% plain marcaine in a normal hallux block fashion and a betadine prep performed.  5. Partial permanent nail avulsion with chemical matrixectomy performed using 3x30sec applications of phenol followed by alcohol flush.  6. Light dressing applied.  Post care instructions provided 7.  Prescription for gentamicin 2% cream  8.   Return to clinic 2 weeks.  Felecia Shelling, DPM [facility]  Dr. Felecia Shelling, DPM    [address] [place], [place] [address]                Office [phone]  Fax [phone]

## 2021-05-27 ENCOUNTER — Encounter: Payer: Self-pay | Admitting: Neurology

## 2021-06-09 ENCOUNTER — Encounter: Payer: Self-pay | Admitting: Neurology

## 2021-06-09 ENCOUNTER — Ambulatory Visit: Payer: Medicaid Other | Admitting: Neurology

## 2021-06-09 VITALS — BP 127/87 | HR 92 | Ht 66.0 in

## 2021-06-09 DIAGNOSIS — G40009 Localization-related (focal) (partial) idiopathic epilepsy and epileptic syndromes with seizures of localized onset, not intractable, without status epilepticus: Secondary | ICD-10-CM

## 2021-06-09 MED ORDER — LEVETIRACETAM 750 MG PO TABS: 750.0000 mg | ORAL_TABLET | Freq: Two times a day (BID) | ORAL | 11 refills | Status: DC
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
## 2021-06-10 ENCOUNTER — Ambulatory Visit: Payer: Medicaid Other | Admitting: Neurology

## 2021-06-10 DIAGNOSIS — G40009 Localization-related (focal) (partial) idiopathic epilepsy and epileptic syndromes with seizures of localized onset, not intractable, without status epilepticus: Secondary | ICD-10-CM | POA: Diagnosis not present

## 2021-06-11 NOTE — Procedures (Signed)
ELECTROENCEPHALOGRAM REPORT ? ?Date of Study: 06/10/2021 ? ?Patient's Name: [name] ?MRN: [id number] ?Date of Birth: 1980-11-05 ? ?Referring Provider: Dr. Patrcia Dolly ? ?Clinical History: This is a 41 year old woman with new onset seizures. EEG for classification. ? ?Medications: ?KEPPRA 750 MG tablet ?PRENATAL 1 + 1 27-1 MG TABS tablet ? ?Technical Summary: ?A multichannel digital 1-hour EEG recording measured by the international 10-20 system with electrodes applied with paste and impedances below 5000 ohms performed in our laboratory with EKG monitoring in an awake and asleep patient.  Hyperventilation and photic stimulation were performed.  The digital EEG was referentially recorded, reformatted, and digitally filtered in a variety of bipolar and referential montages for optimal display.   ? ?Description: ?The patient is awake and asleep during the recording.  During maximal wakefulness, there is a symmetric, medium voltage 9-10 Hz posterior dominant rhythm that attenuates with eye opening.  The record is symmetric.  During drowsiness and sleep, there is an increase in theta slowing of the background.  Vertex waves and symmetric sleep spindles were seen. Hyperventilation and photic stimulation did not elicit any abnormalities.  There were no epileptiform discharges or electrographic seizures seen.   ? ?EKG lead was unremarkable. ? ?Impression: ?This 1-hour awake and asleep EEG is normal.   ? ?Clinical Correlation: ?A normal EEG does not exclude a clinical diagnosis of epilepsy.  If further clinical questions remain, prolonged EEG may be helpful.  Clinical correlation is advised. ? ? ?Patrcia Dolly, M.D. ? ?

## 2021-06-21 ENCOUNTER — Telehealth: Payer: Self-pay | Admitting: Neurology

## 2021-06-21 MED ORDER — DIAZEPAM 5 MG PO TABS: ORAL_TABLET | ORAL | 0 refills | Status: DC

## 2021-06-21 NOTE — Telephone Encounter (Signed)
Pls let her know I sent in Valium 5mg  tablet to take 30 mins prior to MRI. May take second dose if needed. She has to have a driver since she is taking medication. Rx sent to [facility] on [facility]

## 2021-06-21 NOTE — Telephone Encounter (Signed)
Pt called in to let us know she is scheduled to have an MRI on 07/01/21 at [facility]. She will need some medication to help calm her down. Her pharmacy is [facility] in [place]

## 2021-06-22 MED ORDER — DIAZEPAM 5 MG PO TABS: ORAL_TABLET | ORAL | 0 refills | Status: DC

## 2021-06-22 NOTE — Telephone Encounter (Signed)
Sent, thanks

## 2021-06-22 NOTE — Telephone Encounter (Signed)
Pt called informed that Dr Karel Jarvis sent in Valium 5mg  tablet to take 30 mins prior to MRI. May take second dose if needed. She has to have a driver since she is taking medication

## 2021-06-22 NOTE — Addendum Note (Signed)
Addended by: Cameron Sprang on: 06/22/2021 08:54 AM   Modules accepted: Orders

## 2021-07-06 ENCOUNTER — Encounter: Payer: Self-pay | Admitting: *Deleted

## 2021-09-06 ENCOUNTER — Ambulatory Visit: Payer: Medicaid Other | Admitting: Neurology

## 2021-09-06 ENCOUNTER — Encounter: Payer: Self-pay | Admitting: Neurology

## 2021-09-06 VITALS — BP 110/76 | HR 82 | Ht 66.0 in | Wt 268.0 lb

## 2021-09-06 DIAGNOSIS — R9089 Other abnormal findings on diagnostic imaging of central nervous system: Secondary | ICD-10-CM

## 2021-09-06 DIAGNOSIS — G40009 Localization-related (focal) (partial) idiopathic epilepsy and epileptic syndromes with seizures of localized onset, not intractable, without status epilepticus: Secondary | ICD-10-CM | POA: Diagnosis not present

## 2021-09-06 MED ORDER — LEVETIRACETAM 750 MG PO TABS: 750.0000 mg | ORAL_TABLET | Freq: Two times a day (BID) | ORAL | 11 refills | Status: DC

## 2021-09-06 NOTE — Progress Notes (Signed)
NEUROLOGY FOLLOW UP OFFICE NOTE  [name] [id number] [birth year]  HISTORY OF PRESENT ILLNESS: I had the pleasure of seeing [name] in follow-up in the neurology clinic on 09/06/2021.  The patient was last seen 3 months ago for new onset seizures. She is again accompanied by her husband Darren who helps supplement the history today.  Records and images were personally reviewed where available. Her 1-hour wake and sleep EEG in 05/2021 was normal. I personally reviewed MRI brain with and without contrast done 07/2021 which did not show any acute changes. There was a small non-enhancing foci of T2 weighted/diffusion weighted signal abnormality in the left parietal lobe involving the deep cortical ribbon/superficial white matter. There was a small focus of increased signal in the left temporal lobe. 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 She does not have any symptoms of IIH.   Since her last visit, they deny any convulsions/staring/unresponsive episodes since 05/25/2021. She has occasional episodes where she notes a metallic taste with some confusion, but she is able to talk/comprehend. Thre would be tingling in both arms. They last 5-10 minutes. No headaches, dizziness, vision changes, no falls. She usually gets 7 hours of sleep. Mood is okay. She reports stopping the Levetiracetam last month when she returned to work because it made her feel different, drowsy, more irritable. She drinks 1-2 glasses of wine a week.    History on Initial Assessment 06/09/2021: This is a pleasant 41 year old right-handed woman with a history of anxiety presenting for evaluation of new onset seizures. The first seizure occurred in 05/2020, she works remotely and after grabbing lunch started feeling really sick and nauseated. She went to get a trash can then woke up in the hospital. Her husband reports he heard the fall and  found her face down stiff and shaking, she started turning pale and blue so he started doing CPR. She was confused after. She had urinary incontinence and had bitten her tongue. She was brought to [facility] ER where bloodwork and head CT were unremarkable. She had another witnessed seizure last 05/25/2021. She recalls talking to a client when she started feeling really nauseated and felt something like deja vu. She ended the call and alerted her husband that she was not feeling well. She went to the bathroom and then woke up in the ambulance. Darren reports she went to the bathroom for the trash can then he saw her pause, staring off into space then she slowly started turning to the left with low amplitude shaking, vocalizing. He held her as she started falling. She bit her tongue and had urinary incontinence, then fell asleep with sonorous respirations after. She was admitted overnight at [facility], records were reviewed. CBC, CMP unremarkable. EtOH negative, UDS positive for cannabinoids. I personally reviewed brain MRI without contrast, she was claustrophobic and unable to complete the study, with motion degraded images showing increased FLAIR signal in the left parietal lobe. She was discharged home on Levetiracetam 750mg  BID  She and her husband deny any other episodes of staring/unresponsiveness, no olfactory/gustatory hallucinations, myoclonic jerks. Once in a blue moon she would have the feelings of nausea that would not progress. She has a history of traumatic injury to the left hand in 2009 with finger extension weakness, since then she has had numbness that has been radiating up her left arm with pins and needles. She feels it in  her leg as well. No neck pain. She has back pain, no bowel/bladder dysfunction. She has been having headaches since the first seizure, it feels like her eyes are affected when staring at the screen. She saw an eye doctor and was given blue light blocking lenses that  seem to work. She lives with her husband and 3 children ages 3,5,10. She usually gets 6-8 hours of sleep. Mood can be really angry when she is woken up in the morning, this has been ongoing for years, but now she feels tired when waking up and has to give herself time otherwise she would be more moody. She used to drink a glass of red wine daily but stopped after the seizure.   Epilepsy Risk Factors:  She had a normal birth and early development.  There is no history of febrile convulsions, CNS infections such as meningitis/encephalitis, significant traumatic brain injury, neurosurgical procedures, or family history of seizures.  Diagnostic Data: 1-hour wake and sleep EEG in 05/2021 was normal.  MRI brain with and without contrast done 07/2021 did not show any acute changes. There was a small non-enhancing foci of T2 weighted/diffusion weighted signal abnormality in the left parietal lobe involving the deep cortical ribbon/superficial white matter. There was a small focus of increased signal in the left temporal lobe. 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 She does not have any symptoms of IIH.    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     diazepam (VALIUM) 5 MG tablet Take 1 tablet 30 minutes prior to MRI. May take second dose if needed. 2 tablet 0   levETIRAcetam (KEPPRA) 750 MG tablet Take 1 tablet (750 mg total) by mouth 2 (two) times daily. 60 tablet 11   prenatal vitamin w/FE, FA (PRENATAL 1 + 1) 27-1 MG TABS tablet Take 1 tablet by mouth daily at 12 noon.      No current facility-administered medications on file prior to visit.    ALLERGIES: Allergies  Allergen Reactions   Codeine Nausea And Vomiting    Per patient, "I cannot take percocet."   Keppra [Levetiracetam]     750 twice daily    FAMILY HISTORY: Family History  Problem Relation Age of Onset   Cancer Maternal Grandfather        lung   Colon cancer Maternal Grandfather        died in his 51's.    Diabetes Maternal Aunt    Breast cancer Maternal Aunt        2 maternal aunts with breast cancer diagnosed at 39 and 39 yo    SOCIAL HISTORY: Social History   Socioeconomic History   Marital status: Married    Spouse name: Public librarian of children: 3   Years of education: Not on file   Highest education level: Not on file  Occupational History   Not on file  Tobacco Use   Smoking status: Some Days    Types: Cigarettes, Cigars   Smokeless tobacco: Current  Vaping Use  Vaping Use: Never used  Substance and Sexual Activity   Alcohol use: No    Comment: occasional, social drinking   Drug use: Yes    Types: Marijuana    Comment: some times if in pain   Sexual activity: Yes    Birth control/protection: None  Other Topics Concern   Not on file  Social History Narrative   Right   Lives with husband    One story home   Social Determinants of Health   Financial Resource Strain: Low Risk  (05/03/2018)   Overall Financial Resource Strain (CARDIA)    Difficulty of Paying Living Expenses: Not hard at all  Food Insecurity: No Food Insecurity (05/03/2018)   Hunger Vital Sign    Worried About Running Out of Food in the Last Year: Never true    Ran Out of Food in the Last Year: Never true  Transportation Needs: No Transportation Needs (05/03/2018)   PRAPARE - Administrator, Civil Service (Medical): No    Lack of Transportation (Non-Medical): No  Physical Activity: Inactive (05/03/2018)   Exercise Vital Sign    Days of Exercise per Week: 0 days     Minutes of Exercise per Session: 0 min  Stress: No Stress Concern Present (05/03/2018)   [employer] of Occupational Health - Occupational Stress Questionnaire    Feeling of Stress : Only a little  Social Connections: Moderately Integrated (05/03/2018)   Social Connection and Isolation Panel [NHANES]    Frequency of Communication with Friends and Family: Three times a week    Frequency of Social Gatherings with Friends and Family: Three times a week    Attends Religious Services: 1 to 4 times per year    Active Member of Clubs or Organizations: No    Attends Banker Meetings: Never    Marital Status: Married  Catering manager Violence: Unknown (05/03/2018)   Humiliation, Afraid, Rape, and Kick questionnaire    Fear of Current or Ex-Partner: Not asked    Emotionally Abused: Not asked    Physically Abused: Not asked    Sexually Abused: Not on file     PHYSICAL EXAM: Vitals:   09/06/21 1355  BP: 110/76  Pulse: 82  SpO2: 100%   General: No acute distress Head:  Normocephalic/atraumatic Skin/Extremities: No rash, no edema Neurological Exam: alert and awake. No aphasia or dysarthria. Fund of knowledge is appropriate.  Attention and concentration are normal.   Cranial nerves: Pupils equal, round. Extraocular movements intact with no nystagmus. Visual fields full.  No facial asymmetry.  Motor: Bulk and tone normal, muscle strength 5/5 throughout with no pronator drift. Sensation intact to cold.  Finger to nose testing intact.  Gait narrow-based and steady, able to tandem walk adequately.  Romberg negative.   IMPRESSION: This is a pleasant 41 yo RH woman with a history of anxiety and new onset seizures in May 2022 and most recently April 2023. EEG normal. 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 Repeat MRI brain with and without contrast recommended in December 2024. We reviewed MRI images today and discussed plan. She stopped Levetiracetam, we discussed recommendation to restart medication due to having 2 seizures in the setting of abnormal brain MRI. We discussed option to switch medication, she would like to restart Levetiracetam 750mg  BID for now and let know  if any issues back on medication. [place] driving laws discussed, she knows to stop driving until 6 months seizure-free. We discussed avoidance of seizure triggers. Follow-up in 3 months, call for any changes.   Thank you for allowing me to participate in her care.  Please do not hesitate to call for any questions or concerns.    Patrcia Dolly, M.D.   CC: Dr. Idalia Needle

## 2021-09-06 NOTE — Patient Instructions (Signed)
Good to see you.  Restart Keppra (Levetiracetam) 750mg : take 1 tablet twice a day  2. Schedule repeat open MRI brain with and without contrast for December 2023  3. Follow-up in 3 months, call for any changes   Seizure Precautions: 1. If medication has been prescribed for you to prevent seizures, take it exactly as directed.  Do not stop taking the medicine without talking to your doctor first, even if you have not had a seizure in a long time.   2. Avoid activities in which a seizure would cause danger to yourself or to others.  Don't operate dangerous machinery, swim alone, or climb in high or dangerous places, such as on ladders, roofs, or girders.  Do not drive unless your doctor says you may.  3. If you have any warning that you may have a seizure, lay down in a safe place where you can't hurt yourself.    4.  No driving for 6 months from last seizure, as per [facility].   Please refer to the following link on the Epilepsy Foundation of America's website for more information: http://www.epilepsyfoundation.org/answerplace/Social/driving/drivingu.cfm   5.  Maintain good sleep hygiene. Avoid alcohol.  6.  Notify your neurology if you are planning pregnancy or if you become pregnant.  7.  Contact your doctor if you have any problems that may be related to the medicine you are taking.  8.  Call 911 and bring the patient back to the ED if:        A.  The seizure lasts longer than 5 minutes.       B.  The patient doesn't awaken shortly after the seizure  C.  The patient has new problems such as difficulty seeing, speaking or moving  D.  The patient was injured during the seizure  E.  The patient has a temperature over 102 F (39C)  F.  The patient vomited and now is having trouble breathing

## 2021-10-27 ENCOUNTER — Telehealth: Payer: Self-pay | Admitting: Neurology

## 2021-10-27 DIAGNOSIS — G40009 Localization-related (focal) (partial) idiopathic epilepsy and epileptic syndromes with seizures of localized onset, not intractable, without status epilepticus: Secondary | ICD-10-CM

## 2021-10-27 LAB — OB RESULTS CONSOLE TSH: TSH: 2.04

## 2021-10-27 LAB — OB RESULTS CONSOLE HEPATITIS B SURFACE ANTIGEN: Hepatitis B Surface Ag: NEGATIVE

## 2021-10-27 LAB — OB RESULTS CONSOLE HGB/HCT, BLOOD
HCT: 33 (ref 29–41)
Hemoglobin: 10.8

## 2021-10-27 LAB — OB RESULTS CONSOLE HIV ANTIBODY (ROUTINE TESTING): HIV: NONREACTIVE

## 2021-10-27 LAB — OB RESULTS CONSOLE PLATELET COUNT: Platelets: 262

## 2021-10-27 LAB — OB RESULTS CONSOLE RUBELLA ANTIBODY, IGM: Rubella: NON-IMMUNE/NOT IMMUNE

## 2021-10-27 LAB — OB RESULTS CONSOLE GC/CHLAMYDIA
Chlamydia: NEGATIVE
Neisseria Gonorrhea: NEGATIVE

## 2021-10-27 LAB — OB RESULTS CONSOLE RPR: RPR: NONREACTIVE

## 2021-10-27 NOTE — Telephone Encounter (Signed)
Pls send her my congratulations. Let her know Keppra is one of the safer medications to use during pregnancy. The level of Keppra in her system goes down with all the blood flow changes during pregnancy, so we usually need to increase the dose while she is pregnant. She will need to have a Keppra level done this week, then will need level done every month during pregnancy. Thanks

## 2021-10-27 NOTE — Telephone Encounter (Signed)
Pt stated that she hasn't taken her Keppra in 2 months she asked what if she doesn't want to restart her Keppra?

## 2021-10-27 NOTE — Telephone Encounter (Signed)
Pt called in stating she is [redacted] weeks pregnant. She just found out today. Her OBGYN wanted her to let Dr. Delice Lesch know in case any medications need to be changed.

## 2021-10-27 NOTE — Telephone Encounter (Signed)
Pt called no answer left a voice mail to call the office back  °

## 2021-11-01 ENCOUNTER — Other Ambulatory Visit: Payer: Self-pay | Admitting: Neurology

## 2021-11-01 MED ORDER — LEVETIRACETAM 750 MG PO TABS: 750.0000 mg | ORAL_TABLET | Freq: Two times a day (BID) | ORAL | 11 refills | Status: DC

## 2021-11-01 NOTE — Telephone Encounter (Signed)
Spoke to patient. She has been so tired, irritable, not giving her energy. She is sleeping but still gets drowsy, taking a nap on her lunch break.She stopped it when she found out she was pregnant. She was not having side effects but she was irritable but not sure if it was her pregnancy. Job wants her to work 10hrs but she plans for short term disability. Discussed low risks of malformation with Keppra, versus risk of trauma from falls, increased risk of premature labor, lowering of the fetal heart rate in the event of a seizure. Discussed it is important to control seizures during pregnancy. She expressed understanding.   Restart Keppra 750mg  BID, check Keppra level in 2 weeks. We discussed that she will need to have Keppra level done every month after that, usually levels go down during pregnancy and will need to increase dose but go back to original dose after delivery. She was also advised to start a daily folic acid 1mg  with prenatal vitamins.   Heather, pls let her know where to go for Keppra level in 2 weeks. I told her to have levels done first thing in the morning before she takes her morning dose. Thanks

## 2021-11-01 NOTE — Telephone Encounter (Signed)
Pt called informed that lab orders will be placed in Epic she will come to our office to be checked in an them go to [address] to have labs done, and to have blood work done before she takes her morning medication

## 2021-11-01 NOTE — Addendum Note (Signed)
Addended by: Jake Seats on: 11/01/2021 02:21 PM   Modules accepted: Orders

## 2021-11-03 ENCOUNTER — Telehealth: Payer: Self-pay | Admitting: Anesthesiology

## 2021-11-03 NOTE — Telephone Encounter (Signed)
Patient called in regarding her disability forms.

## 2021-11-04 NOTE — Telephone Encounter (Signed)
Pls let her know I got the paperwork and reviewed it, but from a seizure standpoint, she will not be granted short-term disability. Her last seizure was in April, so from a driving standpoint, she can drive. It is probably best if she asks her OB to fill out the form for her, I think it will be denied if I fill it out for seizures because as long as she is not having seizures, she can still work.

## 2021-11-04 NOTE — Telephone Encounter (Signed)
Pt called an informed Dr Delice Lesch got the paperwork and reviewed it, but from a seizure standpoint, she will not be granted short-term disability. Her last seizure was in April, so from a driving standpoint, she can drive. It is probably best if she asks her OB to fill out the form for her, Dr Delice Lesch thinks it will be denied if she (dr Delice Lesch) fill it out for seizures because as long as she is not having seizures, she can still work.pt stated she dose not need the paperwork put up front.

## 2021-11-18 ENCOUNTER — Other Ambulatory Visit (INDEPENDENT_AMBULATORY_CARE_PROVIDER_SITE_OTHER): Payer: Medicaid Other

## 2021-11-18 DIAGNOSIS — G40009 Localization-related (focal) (partial) idiopathic epilepsy and epileptic syndromes with seizures of localized onset, not intractable, without status epilepticus: Secondary | ICD-10-CM | POA: Diagnosis not present

## 2021-11-25 LAB — LEVETIRACETAM LEVEL: Keppra (Levetiracetam): 7.4 ug/mL — ABNORMAL LOW

## 2021-11-26 ENCOUNTER — Other Ambulatory Visit: Payer: Self-pay | Admitting: Neurology

## 2021-11-26 ENCOUNTER — Telehealth: Payer: Self-pay

## 2021-11-26 DIAGNOSIS — Z79899 Other long term (current) drug therapy: Secondary | ICD-10-CM

## 2021-11-26 MED ORDER — LEVETIRACETAM 1000 MG PO TABS
1000.0000 mg | ORAL_TABLET | Freq: Two times a day (BID) | ORAL | 6 refills | Status: DC
Start: 2021-11-26 — End: 2021-12-07

## 2021-11-26 NOTE — Telephone Encounter (Signed)
-----   Message from Cameron Sprang, MD sent at 11/26/2021 12:11 PM EDT ----- Pls check with Latifa how she has been taking her Keppra, is she on 750mg  BID? Her level is low, I would like to increase dose but confirm how she is taking it first, thanks

## 2021-11-26 NOTE — Telephone Encounter (Signed)
Pt called an informed increase to 1000mg : Take 1 tablet BID. Dr Delice Lesch sent in Rx. Recheck level next month

## 2021-11-26 NOTE — Telephone Encounter (Signed)
Thanks, pls let her know to increase to 1000mg : Take 1 tablet BID. I sent in Rx. Recheck level next month, thanks!

## 2021-11-26 NOTE — Telephone Encounter (Signed)
Pt called informed her Keppra level was low. She stated she is taken it 750 mg BID and that she is taken it every day and not missing any doses, pt advised that I will call her back because because Dr Delice Lesch would like to increase the dose,

## 2021-12-07 ENCOUNTER — Encounter: Payer: Self-pay | Admitting: Neurology

## 2021-12-07 ENCOUNTER — Ambulatory Visit: Payer: Medicaid Other | Admitting: Neurology

## 2021-12-07 VITALS — BP 122/82 | HR 89 | Ht 66.0 in | Wt 293.0 lb

## 2021-12-07 DIAGNOSIS — R9089 Other abnormal findings on diagnostic imaging of central nervous system: Secondary | ICD-10-CM | POA: Diagnosis not present

## 2021-12-07 DIAGNOSIS — G40009 Localization-related (focal) (partial) idiopathic epilepsy and epileptic syndromes with seizures of localized onset, not intractable, without status epilepticus: Secondary | ICD-10-CM

## 2021-12-07 MED ORDER — LEVETIRACETAM 1000 MG PO TABS: 1000.0000 mg | ORAL_TABLET | Freq: Two times a day (BID) | ORAL | 6 refills | Status: DC

## 2021-12-07 NOTE — Patient Instructions (Addendum)
Good to see you doing better.  Check Keppra level around November 19  2. Proceed with increasing Keppra to 1000mg  twice a day  3. Proceed with repeat MRI brain without contrast  4. Follow-up in 2 months, call for any changes   Seizure Precautions: 1. If medication has been prescribed for you to prevent seizures, take it exactly as directed.  Do not stop taking the medicine without talking to your doctor first, even if you have not had a seizure in a long time.   2. Avoid activities in which a seizure would cause danger to yourself or to others.  Don't operate dangerous machinery, swim alone, or climb in high or dangerous places, such as on ladders, roofs, or girders.  Do not drive unless your doctor says you may.  3. If you have any warning that you may have a seizure, lay down in a safe place where you can't hurt yourself.    4.  No driving for 6 months from last seizure, as per [facility].   Please refer to the following link on the [employer] website for more information: http://www.epilepsyfoundation.org/answerplace/Social/driving/drivingu.cfm   5.  Maintain good sleep hygiene. Avoid alcohol.  6.  Notify your neurology if you are planning pregnancy or if you become pregnant.  7.  Contact your doctor if you have any problems that may be related to the medicine you are taking.  8.  Call 911 and bring the patient back to the ED if:        A.  The seizure lasts longer than 5 minutes.       B.  The patient doesn't awaken shortly after the seizure  C.  The patient has new problems such as difficulty seeing, speaking or moving  D.  The patient was injured during the seizure  E.  The patient has a temperature over 102 F (39C)  F.  The patient vomited and now is having trouble breathing

## 2021-12-07 NOTE — Progress Notes (Signed)
NEUROLOGY FOLLOW UP OFFICE NOTE  [name] [id number] [birth year]  HISTORY OF PRESENT ILLNESS: I had the pleasure of seeing [name] in follow-up in the neurology clinic on 12/07/2021.  The patient was last seen 3 months ago for new onset seizures. She is alone in the office today. Records and images were personally reviewed where available.  Since her last visit, she called our office to report that she was pregnant. Keppra level on 750mg  BID was 7.4, she was instructed to increase dose to 1000mg  BID over a week ago, she picked up prescription today and will start tonight. She is currently at 15 weeks of pregnancy, due in April 2024. She reports that now that she is in her second trimester, she is getting some energy back. She has been taken out of work for her pregnancy, she is getting to rest more not being constantly in front of a screen for 8 hours. She had been more irritable, unclear if due to [place] or pregnancy. She denies any seizures since April 2023. She is due for follow-up interval brain MRI next month. She has been having on and off headaches. She had a severe headache when she had strep last 10/31, the right side of her face was numb and everything was hurting. She was given Tramadol which helped significantly. Since then, she has had "just regular" pregnancy headaches with good response to Tylenol. She is still on Amoxicillin. Vision is fine. She is sleeping much better since she stopped working, she naps during the day. No falls.    History on Initial Assessment 06/09/2021: This is a pleasant 41 year old right-handed woman with a history of anxiety presenting for evaluation of new onset seizures. The first seizure occurred in 05/2020, she works remotely and after grabbing lunch started feeling really sick and nauseated. She went to get a trash can then woke up in the hospital. Her husband reports he heard the fall and found her face down stiff and shaking, she started turning pale  and blue so he started doing CPR. She was confused after. She had urinary incontinence and had bitten her tongue. She was brought to [facility] ER where bloodwork and head CT were unremarkable. She had another witnessed seizure last 05/25/2021. She recalls talking to a client when she started feeling really nauseated and felt something like deja vu. She ended the call and alerted her husband that she was not feeling well. She went to the bathroom and then woke up in the ambulance. Darren reports she went to the bathroom for the trash can then he saw her pause, staring off into space then she slowly started turning to the left with low amplitude shaking, vocalizing. He held her as she started falling. She bit her tongue and had urinary incontinence, then fell asleep with sonorous respirations after. She was admitted overnight at [facility], records were reviewed. CBC, CMP unremarkable. EtOH negative, UDS positive for cannabinoids. I personally reviewed brain MRI without contrast, she was claustrophobic and unable to complete the study, with motion degraded images showing increased FLAIR signal in the left parietal lobe. She was discharged home on Levetiracetam 750mg  BID  She and her husband deny any other episodes of staring/unresponsiveness, no olfactory/gustatory hallucinations, myoclonic jerks. Once in a blue moon she would have the feelings of nausea that would not progress. She has a history of traumatic injury to the left hand in 2009 with finger extension weakness, since then she has had numbness that has been  radiating up her left arm with pins and needles. She feels it in her leg as well. No neck pain. She has back pain, no bowel/bladder dysfunction. She has been having headaches since the first seizure, it feels like her eyes are affected when staring at the screen. She saw an eye doctor and was given blue light blocking lenses that seem to work. She lives with her husband and 3 children ages  3,5,10. She usually gets 6-8 hours of sleep. Mood can be really angry when she is woken up in the morning, this has been ongoing for years, but now she feels tired when waking up and has to give herself time otherwise she would be more moody. She used to drink a glass of red wine daily but stopped after the seizure.   Epilepsy Risk Factors:  She had a normal birth and early development.  There is no history of febrile convulsions, CNS infections such as meningitis/encephalitis, significant traumatic brain injury, neurosurgical procedures, or family history of seizures.  Diagnostic Data: 1-hour wake and sleep EEG in 05/2021 was normal.  MRI brain with and without contrast done 07/2021 did not show any acute changes. There was a small non-enhancing foci of T2 weighted/diffusion weighted signal abnormality in the left parietal lobe involving the deep cortical ribbon/superficial white matter. There was a small focus of increased signal in the left temporal lobe. 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 She does not have any symptoms of IIH.   PAST MEDICAL HISTORY: Past Medical History:  Diagnosis Date   Depression with anxiety 07/04/2013   Heart murmur    Morbid obesity ([place]) 03/21/2013   Postpartum depression    After fetal loss in 2005 due to anomalies    MEDICATIONS: Current Outpatient Medications on File Prior to Visit  Medication Sig Dispense Refill   citalopram (CELEXA) 10 MG tablet Take 10 mg by mouth daily. (Patient not taking: Reported on 09/06/2021)     levETIRAcetam (KEPPRA) 1000 MG tablet Take 1 tablet (1,000 mg total) by mouth 2 (two) times daily. 60 tablet 6   prenatal vitamin w/FE, FA (PRENATAL 1 + 1) 27-1 MG TABS tablet Take 1 tablet by mouth daily at 12 noon.     No current facility-administered medications on file prior to visit.    ALLERGIES: Allergies  Allergen Reactions   Codeine  Nausea And Vomiting    Per patient, "I cannot take percocet."   Keppra [Levetiracetam]     750 twice daily    FAMILY HISTORY: Family History  Problem Relation Age of Onset   Cancer Maternal Grandfather        lung   Colon cancer Maternal Grandfather        died in his 16's.    Diabetes Maternal Aunt    Breast cancer Maternal Aunt        2 maternal aunts with breast cancer diagnosed at 45 and 39 yo    SOCIAL HISTORY: Social History   Socioeconomic History   Marital status: Married    Spouse name: Editor, commissioning of children: 3   Years of education: Not on file   Highest education level: Not on file  Occupational History   Not on file  Tobacco Use   Smoking status: Some Days    Types: Cigarettes, Cigars   Smokeless tobacco: Current   Tobacco comments:    1/2 cigar a  day  Vaping Use   Vaping Use: Never used  Substance and Sexual Activity   Alcohol use: No    Comment: occasional, social drinking   Drug use: Yes    Comment: some times if in pain   Sexual activity: Yes    Birth control/protection: None  Other Topics Concern   Not on file  Social History Narrative   Right   Lives with husband    One story home   Social Determinants of Health   Financial Resource Strain: Low Risk  (05/03/2018)   Overall Financial Resource Strain (CARDIA)    Difficulty of Paying Living Expenses: Not hard at all  Food Insecurity: No Food Insecurity (05/03/2018)   Hunger Vital Sign    Worried About Running Out of Food in the Last Year: Never true    Ran Out of Food in the Last Year: Never true  Transportation Needs: No Transportation Needs (05/03/2018)   PRAPARE - Hydrologist (Medical): No    Lack of Transportation (Non-Medical): No  Physical Activity: Inactive (05/03/2018)   Exercise Vital Sign    Days of Exercise per Week: 0 days    Minutes of Exercise per Session: 0 min  Stress: No Stress Concern Present (05/03/2018)   [place]    Feeling of Stress : Only a little  Social Connections: Moderately Integrated (05/03/2018)   Social Connection and Isolation Panel [NHANES]    Frequency of Communication with Friends and Family: Three times a week    Frequency of Social Gatherings with Friends and Family: Three times a week    Attends Religious Services: 1 to 4 times per year    Active Member of Clubs or Organizations: No    Attends Archivist Meetings: Never    Marital Status: Married  Human resources officer Violence: Unknown (05/03/2018)   Humiliation, Afraid, Rape, and Kick questionnaire    Fear of Current or Ex-Partner: Not asked    Emotionally Abused: Not asked    Physically Abused: Not asked    Sexually Abused: Not on file     PHYSICAL EXAM: Vitals:   12/07/21 1610  BP: 122/82  Pulse: 89  SpO2: 99%   General: No acute distress Head:  Normocephalic/atraumatic Skin/Extremities: No rash, no edema Neurological Exam: alert and awake. No aphasia or dysarthria. Fund of knowledge is appropriate. Attention and concentration are normal.   Cranial nerves: Pupils equal, round. Extraocular movements intact with no nystagmus. Visual fields full.  No facial asymmetry.  Motor: Bulk and tone normal, muscle strength 5/5 throughout with no pronator drift.   Finger to nose testing intact.  Gait narrow-based and steady, no ataxia   IMPRESSION: This is a pleasant 41 yo RH woman with a history of anxiety and new onset seizures in May 2022 and most recently April 2023. EEG normal. Her brain MRI showed a non-enhancing lesion in the left parietal lobe involving the deep cortical ribbon/superficial white matter, differentials include multinodular and vacuolating tumor, dysembryoplastic neuroepithelial tumor, focal cortical dysplasia, perivascular spaces. She will repeat brain MRI without contrast next month. No seizures since 05/2021. She is now [redacted] weeks pregnant, we discussed need  to do Keppra levels every month during pregnancy, increase Levetiracetam to 1000mg  BID and re-check level for November as discussed. She is aware of  driving laws to stop driving until 6 months seizure-free. Follow-up in 2 months, call for any changes.    Thank you  for allowing me to participate in her care.  Please do not hesitate to call for any questions or concerns.    Ellouise Newer, M.D.   CC: Dr. Arby Barrette

## 2021-12-09 DIAGNOSIS — Z0279 Encounter for issue of other medical certificate: Secondary | ICD-10-CM

## 2021-12-29 ENCOUNTER — Other Ambulatory Visit (INDEPENDENT_AMBULATORY_CARE_PROVIDER_SITE_OTHER): Payer: Medicaid Other

## 2021-12-29 DIAGNOSIS — Z79899 Other long term (current) drug therapy: Secondary | ICD-10-CM

## 2022-01-06 ENCOUNTER — Telehealth: Payer: Self-pay

## 2022-01-06 LAB — LEVETIRACETAM LEVEL: Keppra (Levetiracetam): 5.6 ug/mL — ABNORMAL LOW

## 2022-01-06 MED ORDER — LEVETIRACETAM 1000 MG PO TABS: ORAL_TABLET | ORAL | 4 refills | Status: DC

## 2022-01-06 NOTE — Telephone Encounter (Signed)
-----   Message from Van Clines, MD sent at 01/06/2022 12:12 PM EST ----- Pls let her know Keppra level is still low (lower than last time, has she missed any doses?). Need to increase Keppra to 1000mg : take 1 and 1/2 tablets twice a day. Pls send in updated Rx, thanks

## 2022-01-06 NOTE — Telephone Encounter (Signed)
Pt called an informed that the Keppra level is still low (lower than last time, has she missed any doses? Hasn't missed any meds ). Need to increase Keppra to 1000mg : take 1 and 1/2 tablets twice a day. New RX sent in

## 2022-01-31 NOTE — L&D Delivery Note (Signed)
Operative Delivery Note I was still atrending to a delivery in another room and was called at approx 0645(delivery in other room was 0641) for FHR decels with very poor recovery with cx being complete/0 station and poor maternal effort Pt is TOLAC successful VBAC x 2, pre eclampsia with severe features on magnesium with epidural I arrived in the room at approx 0648 I assessed the baby movement with 1 maternal pus which was poor but she did move the baby enough to make me comfortable with a vacuum assisted vaginal delivery inn this setting of a FHR pattern which would require delivery as quickly as possible  He station was not appropriate for Dr Salvadore Dom so I performed it myself I informed the patient of this and she agreed  I placed the mighty vac vacuum at [phone] and with 2 maternal contractions delivered the baby from +1 station OA over intact perineum I had NICU delivery team be called in anticipation of neonatal resuscitation, they were still in the room I just left and they arrived at about 30 seconds of life   At 6:54 AM a viable female was delivered via Vaginal, Vacuum Investment banker, operational).   Presentation: vertex; Position: Occiput,, Anterior; Station: +1.  Verbal consent: obtained from patient.  Risks and benefits discussed in detail.  Risks include, but are not limited to the risks of anesthesia, bleeding, infection, damage to maternal tissues, fetal cephalhematoma.  There is also the risk of inability to effect vaginal delivery of the head, or shoulder dystocia that cannot be resolved by established maneuvers, leading to the need for emergency cesarean section.  APGAR: 4/8   Placenta status: delivered time noted in delivery summary, .   Cord: 3 vessel with the following complications: .  Cord pH: arterial was drawn and pending at the time of this note  Anesthesia:   Instruments: epidural Episiotomy: None Lacerations: None Suture Repair:  Est. Blood Loss (mL): 82  Mom to postpartum. OBSC  Baby to Couplet care / Skin to Skin.  Lazaro Arms 05/14/2022, 7:18 AM

## 2022-02-17 ENCOUNTER — Encounter: Payer: Self-pay | Admitting: Neurology

## 2022-02-17 ENCOUNTER — Ambulatory Visit (INDEPENDENT_AMBULATORY_CARE_PROVIDER_SITE_OTHER): Payer: Medicaid Other | Admitting: Neurology

## 2022-02-17 VITALS — BP 124/85 | HR 98 | Ht 66.0 in | Wt 311.6 lb

## 2022-02-17 DIAGNOSIS — G40009 Localization-related (focal) (partial) idiopathic epilepsy and epileptic syndromes with seizures of localized onset, not intractable, without status epilepticus: Secondary | ICD-10-CM | POA: Diagnosis not present

## 2022-02-17 DIAGNOSIS — R9089 Other abnormal findings on diagnostic imaging of central nervous system: Secondary | ICD-10-CM

## 2022-02-17 DIAGNOSIS — R519 Headache, unspecified: Secondary | ICD-10-CM

## 2022-02-17 NOTE — Progress Notes (Signed)
NEUROLOGY FOLLOW UP OFFICE NOTE  [name] [id number] Dec 01, 1980  HISTORY OF PRESENT ILLNESS: I had the pleasure of seeing [name] in follow-up in the neurology clinic on 02/17/2022.  The patient was last seen 2 months ago for new onset seizures. She is alone in the office today. Records and images were personally reviewed where available.  She is currently [redacted] weeks pregnant. No seizures since April 2023. Last Keppra level in 12/2021 was 5.6, advised to increase dose to 1500mg  BID. She reports that since increasing the dose, she has had daily headaches in the occipital region with throbbing pain temporarily relieved by Tylenol. She takes aspirin daily. BP today normal, she is on Labetalol. She denies any vision changes, no dizziness or tinnitus. She has been having right arm tingling and numbness and uses a wrist brace. Her fingers are swollen and the tips feel like they are frostbitten. She has neck pain. No falls.    History on Initial Assessment 06/09/2021: This is a pleasant 42 year old right-handed woman with a history of anxiety presenting for evaluation of new onset seizures. The first seizure occurred in 05/2020, she works remotely and after grabbing lunch started feeling really sick and nauseated. She went to get a trash can then woke up in the hospital. Her husband reports he heard the fall and found her face down stiff and shaking, she started turning pale and blue so he started doing CPR. She was confused after. She had urinary incontinence and had bitten her tongue. She was brought to [facility] ER where bloodwork and head CT were unremarkable. She had another witnessed seizure last 05/25/2021. She recalls talking to a client when she started feeling really nauseated and felt something like deja vu. She ended the call and alerted her husband that she was not feeling well. She went to the bathroom and then woke up in the ambulance. Darren reports she went to the bathroom for the  trash can then he saw her pause, staring off into space then she slowly started turning to the left with low amplitude shaking, vocalizing. He held her as she started falling. She bit her tongue and had urinary incontinence, then fell asleep with sonorous respirations after. She was admitted overnight at [facility], records were reviewed. CBC, CMP unremarkable. EtOH negative, UDS positive for cannabinoids. I personally reviewed brain MRI without contrast, she was claustrophobic and unable to complete the study, with motion degraded images showing increased FLAIR signal in the left parietal lobe. She was discharged home on Levetiracetam 750mg  BID  She and her husband deny any other episodes of staring/unresponsiveness, no olfactory/gustatory hallucinations, myoclonic jerks. Once in a blue moon she would have the feelings of nausea that would not progress. She has a history of traumatic injury to the left hand in 2009 with finger extension weakness, since then she has had numbness that has been radiating up her left arm with pins and needles. She feels it in her leg as well. No neck pain. She has back pain, no bowel/bladder dysfunction. She has been having headaches since the first seizure, it feels like her eyes are affected when staring at the screen. She saw an eye doctor and was given blue light blocking lenses that seem to work. She lives with her husband and 3 children ages 3,5,10. She usually gets 6-8 hours of sleep. Mood can be really angry when she is woken up in the morning, this has been ongoing for years, but now she feels  tired when waking up and has to give herself time otherwise she would be more moody. She used to drink a glass of red wine daily but stopped after the seizure.   Epilepsy Risk Factors:  She had a normal birth and early development.  There is no history of febrile convulsions, CNS infections such as meningitis/encephalitis, significant traumatic brain injury, neurosurgical  procedures, or family history of seizures.  Diagnostic Data: 1-hour wake and sleep EEG in 05/2021 was normal.  MRI brain with and without contrast done 07/2021 did not show any acute changes. There was a small non-enhancing foci of T2 weighted/diffusion weighted signal abnormality in the left parietal lobe involving the deep cortical ribbon/superficial white matter. There was a small focus of increased signal in the left temporal lobe. 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 She does not have any symptoms of IIH.   PAST MEDICAL HISTORY: Past Medical History:  Diagnosis Date   Depression with anxiety 07/04/2013   Heart murmur    Morbid obesity ([facility]) 03/21/2013   Postpartum depression    After fetal loss in 2005 due to anomalies    MEDICATIONS: Current Outpatient Medications on File Prior to Visit  Medication Sig Dispense Refill   folic acid (FOLVITE) 1 MG tablet Take 1 tablet by mouth daily.     levETIRAcetam (KEPPRA) 1000 MG tablet take 1 and 1/2 tablets twice a day 90 tablet 4   prenatal vitamin w/FE, FA (PRENATAL 1 + 1) 27-1 MG TABS tablet Take 1 tablet by mouth daily at 12 noon.     No current facility-administered medications on file prior to visit.    ALLERGIES: Allergies  Allergen Reactions   Codeine Nausea And Vomiting    Per patient, "I cannot take percocet."    FAMILY HISTORY: Family History  Problem Relation Age of Onset   Cancer Maternal Grandfather        lung   Colon cancer Maternal Grandfather        died in his 83's.    Diabetes Maternal Aunt    Breast cancer Maternal Aunt        2 maternal aunts with breast cancer diagnosed at 41 and 40 yo    SOCIAL HISTORY: Social History   Socioeconomic History   Marital status: Married    Spouse name: Public librarian of children: 3   Years of education: Not on file   Highest education level: Not on file   Occupational History   Not on file  Tobacco Use   Smoking status: Some Days    Types: Cigarettes, Cigars   Smokeless tobacco: Current   Tobacco comments:    1/2 cigar a day  Vaping Use   Vaping Use: Never used  Substance and Sexual Activity   Alcohol use: No    Comment: occasional, social drinking   Drug use: Yes    Comment: some times if in pain   Sexual activity: Yes    Birth control/protection: None  Other Topics Concern   Not on file  Social History Narrative   Right   Lives with husband    One story home   Social Determinants of Health   Financial Resource Strain: Low Risk  (05/03/2018)   Overall Financial Resource Strain (CARDIA)    Difficulty of Paying Living Expenses: Not hard at all  Food Insecurity: No Food Insecurity (05/03/2018)   Hunger Vital  Sign    Worried About Charity fundraiser in the Last Year: Never true    [place] in the Last Year: Never true  Transportation Needs: No Transportation Needs (05/03/2018)   PRAPARE - Hydrologist (Medical): No    Lack of Transportation (Non-Medical): No  Physical Activity: Inactive (05/03/2018)   Exercise Vital Sign    Days of Exercise per Week: 0 days    Minutes of Exercise per Session: 0 min  Stress: No Stress Concern Present (05/03/2018)   [place]    Feeling of Stress : Only a little  Social Connections: Moderately Integrated (05/03/2018)   Social Connection and Isolation Panel [NHANES]    Frequency of Communication with Friends and Family: Three times a week    Frequency of Social Gatherings with Friends and Family: Three times a week    Attends Religious Services: 1 to 4 times per year    Active Member of Clubs or Organizations: No    Attends Archivist Meetings: Never    Marital Status: Married  Human resources officer Violence: Unknown (05/03/2018)   Humiliation, Afraid, Rape, and Kick questionnaire    Fear of  Current or Ex-Partner: Not asked    Emotionally Abused: Not asked    Physically Abused: Not asked    Sexually Abused: Not on file     PHYSICAL EXAM: Vitals:   02/17/22 1344  BP: 124/85  Pulse: 98  SpO2: 99%   General: No acute distress Head:  Normocephalic/atraumatic Skin/Extremities: No rash, no edema Neurological Exam: alert and awake. No aphasia or dysarthria. Fund of knowledge is appropriate. Attention and concentration are normal.   Cranial nerves: Pupils equal, round. Extraocular movements intact with no nystagmus. Visual fields full.  No facial asymmetry.  Motor: Bulk and tone normal, muscle strength 5/5 throughout with no pronator drift.   Finger to nose testing intact.  Gait wide-based, no ataxia.    IMPRESSION: This is a pleasant 42 yo RH woman with a history of anxiety and new onset seizures in May 2022 and most recently April 2023. EEG normal. Her brain MRI showed a non-enhancing lesion in the left parietal lobe involving the deep cortical ribbon/superficial white matter, differentials include multinodular and vacuolating tumor, dysembryoplastic neuroepithelial tumor, focal cortical dysplasia, perivascular spaces. There were also note of partial empty sella with narrowing of the bilateral transverse sinuses near the sigmoid junction, at that time she did not have any symptoms of IIH. Today she is reporting daily headaches that started after increase in Levetiracetam dose. Due to abnormalities on MRI, would repeat MRI brain without contrast (cannot do contrast due to pregnancy) to assess for underlying structural abnormalities. She was advised to follow-up with her eye doctor for formal evaluation of any papilledema. We discussed risks of breakthrough seizure during pregnancy if Keppra levels are low, check Keppra level. If within range, we may reduce dose slightly, but this is not ideal. We discussed that if level is low, would opt to add on a different seizure medication  (Lamotrigine) so we can reduce Keppra dose. Follow-up in 4 months, call for any changes.    Thank you for allowing me to participate in her care.  Please do not hesitate to call for any questions or concerns.    Ellouise Newer, M.D.   CC: Dr. Arby Barrette

## 2022-02-17 NOTE — Patient Instructions (Signed)
Good to see you.  Have Keppra level done. We will decide on next step depending on level. We may have to add on another seizure medication if we reduce Keppra dose  2. Schedule MRI brain without contrast. Let us know if you don't hear back about scheduling  3. Please see if you can make an appointment with your eye doctor and check if there is increased pressure noted on eye exam  4. Follow-up in 4 months, call for any changes   Seizure Precautions: 1. If medication has been prescribed for you to prevent seizures, take it exactly as directed.  Do not stop taking the medicine without talking to your doctor first, even if you have not had a seizure in a long time.   2. Avoid activities in which a seizure would cause danger to yourself or to others.  Don't operate dangerous machinery, swim alone, or climb in high or dangerous places, such as on ladders, roofs, or girders.  Do not drive unless your doctor says you may.  3. If you have any warning that you may have a seizure, lay down in a safe place where you can't hurt yourself.    4.  No driving for 6 months from last seizure, as per [facility].   Please refer to the following link on the [place] website for more information: http://[email or web address].org/[email or web address]/driving/drivingu.cfm   5.  Maintain good sleep hygiene. Avoid alcohol.  6.  Notify your neurology if you are planning pregnancy or if you become pregnant.  7.  Contact your doctor if you have any problems that may be related to the medicine you are taking.  8.  Call 911 and bring the patient back to the ED if:        A.  The seizure lasts longer than 5 minutes.       B.  The patient doesn't awaken shortly after the seizure  C.  The patient has new problems such as difficulty seeing, speaking or moving  D.  The patient was injured during the seizure  E.  The patient has a temperature over 102 F (39C)  F.  The patient vomited  and now is having trouble breathing

## 2022-02-18 ENCOUNTER — Ambulatory Visit: Payer: Medicaid Other | Admitting: Neurology

## 2022-02-22 ENCOUNTER — Encounter: Payer: Self-pay | Admitting: Obstetrics & Gynecology

## 2022-02-22 ENCOUNTER — Ambulatory Visit (INDEPENDENT_AMBULATORY_CARE_PROVIDER_SITE_OTHER): Payer: Medicaid Other | Admitting: Obstetrics & Gynecology

## 2022-02-22 VITALS — BP 128/84 | HR 90 | Wt 314.0 lb

## 2022-02-22 DIAGNOSIS — Z98891 History of uterine scar from previous surgery: Secondary | ICD-10-CM

## 2022-02-22 DIAGNOSIS — O0992 Supervision of high risk pregnancy, unspecified, second trimester: Secondary | ICD-10-CM | POA: Diagnosis not present

## 2022-02-22 DIAGNOSIS — M654 Radial styloid tenosynovitis [de Quervain]: Secondary | ICD-10-CM

## 2022-02-22 DIAGNOSIS — Z3A26 26 weeks gestation of pregnancy: Secondary | ICD-10-CM

## 2022-02-22 DIAGNOSIS — O99352 Diseases of the nervous system complicating pregnancy, second trimester: Secondary | ICD-10-CM | POA: Diagnosis not present

## 2022-02-22 DIAGNOSIS — O99350 Diseases of the nervous system complicating pregnancy, unspecified trimester: Secondary | ICD-10-CM

## 2022-02-22 DIAGNOSIS — O0990 Supervision of high risk pregnancy, unspecified, unspecified trimester: Secondary | ICD-10-CM

## 2022-02-22 NOTE — Progress Notes (Signed)
Subjective:transfer [facility]    [name] is a [id number] [redacted]w[redacted]d being seen today for her first obstetrical visit.  Her obstetrical history is significant for advanced maternal age and [place] and Sz D/O . Patient does intend to breast feed. Pregnancy history fully reviewed.  Patient reports carpal tunnel symptoms.  Vitals:   02/22/22 1119  BP: 128/84  Pulse: 90  Weight: (!) 314 lb (142.4 kg)    HISTORY: OB History  Gravida Para Term Preterm AB Living  9 5 5  0 3 4  SAB IAB Ectopic Multiple Live Births  2 1 0 0 5    # Outcome Date GA Lbr Len/2nd Weight Sex Delivery Anes PTL Lv  9 Current           8 Term 05/04/18 [redacted]w[redacted]d 01:08 / 00:08 6 lb 8.8 oz (2.971 kg) F VBAC EPI  LIV  7 Term 02/19/16 [redacted]w[redacted]d 06:21 / 00:20 6 lb 15.1 oz (3.15 kg) M VBAC EPI  LIV  6 Term 02/24/11 [redacted]w[redacted]d  7 lb 9 oz (3.43 kg) M CS-LTranv EPI  LIV     Birth Comments: laceration above left eyebrow     Complications: Umbilical cord prolapse  5 SAB 2012 [redacted]w[redacted]d            Birth Comments: IUFD  4 SAB 2007 [redacted]w[redacted]d         3 IAB 2005 [redacted]w[redacted]d            Birth Comments: multiple anomalies  2 Term 2005    F Vag-Spont   DEC     Birth Comments: SIDS 2 mo.  1 Term 2002 [redacted]w[redacted]d   M Vag-Spont   LIV   Past Medical History:  Diagnosis Date   Depression with anxiety 07/04/2013   Heart murmur    Hypertension    Morbid obesity ([place]) 03/21/2013   Postpartum depression    After fetal loss in 2005 due to anomalies   Past Surgical History:  Procedure Laterality Date   BREAST SURGERY  2005   L breast infection after loss   CESAREAN SECTION  02/24/2011   Procedure: CESAREAN SECTION;  Surgeon: Shelly Bombard, MD;  Location: [facility];  Service: Gynecology;  Laterality: N/A;  Primary, cord ph 7.29   Family History  Problem Relation Age of Onset   Diabetes Maternal Aunt    Breast cancer Maternal Aunt        2 maternal aunts with breast cancer diagnosed at 21 and 40 yo   Cancer Maternal Grandfather        lung   Colon cancer Maternal  Grandfather        died in his 36's.      Exam    Uterus:   27 cm  Pelvic Exam: deferred                              System: Breast:  normal appearance, no masses or tenderness, deferred   Skin: normal coloration and turgor, no rashes    Neurologic: oriented, normal mood, grossly non-focal   Extremities: normal strength, tone, and muscle mass   HEENT PERRLA and extra ocular movement intact   Mouth/Teeth dental hygiene good   Neck supple   Cardiovascular: regular rate and rhythm   Respiratory:  appears well, vitals normal, no respiratory distress, acyanotic, normal RR   Abdomen: gravid   Urinary:       Assessment:    Pregnancy:  [id number] Patient Active Problem List   Diagnosis Date Noted   Supervision of high risk pregnancy, antepartum 02/22/2022   Great toe pain, right 04/10/2019   Harriet Pho tenosynovitis 08/30/2018   History of gestational hypertension 11/15/2017   History of cesarean section 11/15/2017   History of VBAC 11/15/2017   Morbid obesity with body mass index of 45.0-49.9 in adult [facility]) 01/29/2016   Depression with anxiety 07/04/2013        Plan:     Initial labs drawn. Prenatal vitamins. Problem list reviewed and updated. Genetic Screening discussed : results reviewed.  Ultrasound discussed; fetal survey: ordered.  Follow up in 2 weeks. 50% of 30 min visit spent on counseling and coordination of care.  Needs glucose screen, growth scan   [name] 02/22/2022

## 2022-02-22 NOTE — Progress Notes (Signed)
OB Transfer of Care from [facility] Anatomy scan=possible cardiac defect/ Fetal Echo done 02/11/22 WNL  11/24/21 HgA1C 5.5  cHTN, pt denies  EDD 05/25/22 by [place] per pt

## 2022-03-10 ENCOUNTER — Ambulatory Visit (INDEPENDENT_AMBULATORY_CARE_PROVIDER_SITE_OTHER): Payer: Medicaid Other | Admitting: Obstetrics

## 2022-03-10 ENCOUNTER — Encounter: Payer: Self-pay | Admitting: Obstetrics

## 2022-03-10 ENCOUNTER — Other Ambulatory Visit: Payer: Medicaid Other

## 2022-03-10 VITALS — BP 136/84 | HR 94 | Wt 313.0 lb

## 2022-03-10 DIAGNOSIS — O99213 Obesity complicating pregnancy, third trimester: Secondary | ICD-10-CM | POA: Diagnosis not present

## 2022-03-10 DIAGNOSIS — O34219 Maternal care for unspecified type scar from previous cesarean delivery: Secondary | ICD-10-CM | POA: Diagnosis not present

## 2022-03-10 DIAGNOSIS — O139 Gestational [pregnancy-induced] hypertension without significant proteinuria, unspecified trimester: Secondary | ICD-10-CM

## 2022-03-10 DIAGNOSIS — O99210 Obesity complicating pregnancy, unspecified trimester: Secondary | ICD-10-CM

## 2022-03-10 DIAGNOSIS — O133 Gestational [pregnancy-induced] hypertension without significant proteinuria, third trimester: Secondary | ICD-10-CM

## 2022-03-10 DIAGNOSIS — Z3A29 29 weeks gestation of pregnancy: Secondary | ICD-10-CM

## 2022-03-10 DIAGNOSIS — G40909 Epilepsy, unspecified, not intractable, without status epilepticus: Secondary | ICD-10-CM

## 2022-03-10 DIAGNOSIS — O99353 Diseases of the nervous system complicating pregnancy, third trimester: Secondary | ICD-10-CM

## 2022-03-10 DIAGNOSIS — O0990 Supervision of high risk pregnancy, unspecified, unspecified trimester: Secondary | ICD-10-CM

## 2022-03-10 DIAGNOSIS — Z98891 History of uterine scar from previous surgery: Secondary | ICD-10-CM

## 2022-03-10 DIAGNOSIS — G5603 Carpal tunnel syndrome, bilateral upper limbs: Secondary | ICD-10-CM

## 2022-03-10 NOTE — Progress Notes (Signed)
Subjective:  Lawana Hartzell is a 42 y.o. [id number] at [redacted]w[redacted]d being seen today for ongoing prenatal care.  She is currently monitored for the following issues for this high-risk pregnancy and has Depression with anxiety; Morbid obesity with body mass index of 45.0-49.9 in adult [facility]); History of gestational hypertension; History of cesarean section; History of VBAC; De Quervain's tenosynovitis; Great toe pain, right; Supervision of high risk pregnancy, antepartum; and Seizure disorder during pregnancy, antepartum ([place]) on their problem list.  Patient reports  carpal tunnel symptoms .  Contractions: Irritability. Vag. Bleeding: None.  Movement: Present. Denies leaking of fluid.   The following portions of the patient's history were reviewed and updated as appropriate: allergies, current medications, past family history, past medical history, past social history, past surgical history and problem list. Problem list updated.  Objective:   Vitals:   03/10/22 0904 03/10/22 0918  BP:  136/84  Pulse:  94  Weight: (!) 313 lb (142 kg) (!) 313 lb (142 kg)    Fetal Status: Fetal Heart Rate (bpm): 144   Movement: Present     General:  Alert, oriented and cooperative. Patient is in no acute distress.  Skin: Skin is warm and dry. No rash noted.   Cardiovascular: Normal heart rate noted  Respiratory: Normal respiratory effort, no problems with respiration noted  Abdomen: Soft, gravid, appropriate for gestational age. Pain/Pressure: Present     Pelvic:  Cervical exam deferred        Extremities: Normal range of motion.  Edema: Mild pitting, slight indentation  Mental Status: Normal mood and affect. Normal behavior. Normal judgment and thought content.   Urinalysis:      Assessment and Plan:  Pregnancy: [id number] at [redacted]w[redacted]d  1. Supervision of high risk pregnancy, antepartum Rx: - Glucose Tolerance, 2 Hours w/1 Hour - HIV antibody (with reflex) - RPR - CBC  2. History of cesarean section  3. History of  VBAC X 2  4. Patient desires vaginal birth after cesarean section (VBAC)  5. PIH (pregnancy induced hypertension), antepartum - BP clinically stable on Labetalol  6. Seizure disorder during pregnancy, antepartum ([place]) - clinically stable on Keppra  7. Carpal tunnel syndrome, bilateral - wrist splints recommended  8. Obesity affecting pregnancy, antepartum     Preterm labor symptoms and general obstetric precautions including but not limited to vaginal bleeding, contractions, leaking of fluid and fetal movement were reviewed in detail with the patient. Please refer to After Visit Summary for other counseling recommendations.   Return in about 2 weeks (around 03/24/2022) for [facility]   Shelly Bombard, MD 03/10/22

## 2022-03-10 NOTE — Progress Notes (Signed)
Pt presents for ROB visit. Declined Tdap today. Pt c/o of carpal tunnel, numbness in the legs and feet and increased swelling.

## 2022-03-11 LAB — CBC
Hematocrit: 32.8 % — ABNORMAL LOW (ref 34.0–46.6)
Hemoglobin: 10.4 g/dL — ABNORMAL LOW (ref 11.1–15.9)
MCH: 24.6 pg — ABNORMAL LOW (ref 26.6–33.0)
MCHC: 31.7 g/dL (ref 31.5–35.7)
MCV: 78 fL — ABNORMAL LOW (ref 79–97)
Platelets: 279 10*3/uL (ref 150–450)
RBC: 4.23 x10E6/uL (ref 3.77–5.28)
RDW: 13.1 % (ref 11.7–15.4)
WBC: 9 10*3/uL (ref 3.4–10.8)

## 2022-03-11 LAB — HIV ANTIBODY (ROUTINE TESTING W REFLEX): HIV Screen 4th Generation wRfx: NONREACTIVE

## 2022-03-11 LAB — GLUCOSE TOLERANCE, 2 HOURS W/ 1HR
Glucose, 1 hour: 157 mg/dL (ref 70–179)
Glucose, 2 hour: 115 mg/dL (ref 70–152)
Glucose, Fasting: 90 mg/dL (ref 70–91)

## 2022-03-11 LAB — RPR: RPR Ser Ql: NONREACTIVE

## 2022-03-16 ENCOUNTER — Other Ambulatory Visit: Payer: Self-pay | Admitting: *Deleted

## 2022-03-16 ENCOUNTER — Other Ambulatory Visit: Payer: Self-pay | Admitting: Obstetrics

## 2022-03-16 ENCOUNTER — Ambulatory Visit: Payer: Medicaid Other | Admitting: *Deleted

## 2022-03-16 ENCOUNTER — Ambulatory Visit: Payer: Medicaid Other | Attending: Obstetrics & Gynecology

## 2022-03-16 ENCOUNTER — Encounter: Payer: Self-pay | Admitting: *Deleted

## 2022-03-16 VITALS — BP 127/70 | HR 93

## 2022-03-16 DIAGNOSIS — O99013 Anemia complicating pregnancy, third trimester: Secondary | ICD-10-CM

## 2022-03-16 DIAGNOSIS — Z3689 Encounter for other specified antenatal screening: Secondary | ICD-10-CM | POA: Diagnosis present

## 2022-03-16 DIAGNOSIS — Z3A29 29 weeks gestation of pregnancy: Secondary | ICD-10-CM

## 2022-03-16 DIAGNOSIS — O10919 Unspecified pre-existing hypertension complicating pregnancy, unspecified trimester: Secondary | ICD-10-CM

## 2022-03-16 DIAGNOSIS — O99353 Diseases of the nervous system complicating pregnancy, third trimester: Secondary | ICD-10-CM | POA: Diagnosis not present

## 2022-03-16 DIAGNOSIS — G40909 Epilepsy, unspecified, not intractable, without status epilepticus: Secondary | ICD-10-CM | POA: Diagnosis not present

## 2022-03-16 DIAGNOSIS — O10013 Pre-existing essential hypertension complicating pregnancy, third trimester: Secondary | ICD-10-CM | POA: Diagnosis not present

## 2022-03-16 DIAGNOSIS — O09523 Supervision of elderly multigravida, third trimester: Secondary | ICD-10-CM | POA: Diagnosis not present

## 2022-03-16 DIAGNOSIS — O0992 Supervision of high risk pregnancy, unspecified, second trimester: Secondary | ICD-10-CM | POA: Insufficient documentation

## 2022-03-16 DIAGNOSIS — O99213 Obesity complicating pregnancy, third trimester: Secondary | ICD-10-CM

## 2022-03-16 DIAGNOSIS — E669 Obesity, unspecified: Secondary | ICD-10-CM

## 2022-03-16 DIAGNOSIS — O34219 Maternal care for unspecified type scar from previous cesarean delivery: Secondary | ICD-10-CM

## 2022-03-16 MED ORDER — ACCRUFER 30 MG PO CAPS: 1.0000 | ORAL_CAPSULE | Freq: Two times a day (BID) | ORAL | 3 refills | Status: DC

## 2022-03-17 NOTE — Progress Notes (Signed)
RX was sent to [facility]. Information on [facility] sent via MyChart.

## 2022-03-24 ENCOUNTER — Encounter: Payer: Self-pay | Admitting: Obstetrics and Gynecology

## 2022-03-24 ENCOUNTER — Ambulatory Visit (INDEPENDENT_AMBULATORY_CARE_PROVIDER_SITE_OTHER): Payer: Medicaid Other | Admitting: Obstetrics and Gynecology

## 2022-03-24 VITALS — BP 137/85 | HR 103 | Wt 318.0 lb

## 2022-03-24 DIAGNOSIS — O10913 Unspecified pre-existing hypertension complicating pregnancy, third trimester: Secondary | ICD-10-CM

## 2022-03-24 DIAGNOSIS — Z3009 Encounter for other general counseling and advice on contraception: Secondary | ICD-10-CM

## 2022-03-24 DIAGNOSIS — O99353 Diseases of the nervous system complicating pregnancy, third trimester: Secondary | ICD-10-CM

## 2022-03-24 DIAGNOSIS — O0993 Supervision of high risk pregnancy, unspecified, third trimester: Secondary | ICD-10-CM

## 2022-03-24 DIAGNOSIS — O10919 Unspecified pre-existing hypertension complicating pregnancy, unspecified trimester: Secondary | ICD-10-CM

## 2022-03-24 DIAGNOSIS — Z3A30 30 weeks gestation of pregnancy: Secondary | ICD-10-CM

## 2022-03-24 DIAGNOSIS — Z6841 Body Mass Index (BMI) 40.0 and over, adult: Secondary | ICD-10-CM

## 2022-03-24 DIAGNOSIS — O0990 Supervision of high risk pregnancy, unspecified, unspecified trimester: Secondary | ICD-10-CM

## 2022-03-24 DIAGNOSIS — Z98891 History of uterine scar from previous surgery: Secondary | ICD-10-CM

## 2022-03-24 DIAGNOSIS — G40909 Epilepsy, unspecified, not intractable, without status epilepticus: Secondary | ICD-10-CM

## 2022-03-24 NOTE — Progress Notes (Signed)
Subjective:  [name] is a 42 y.o. [id number] at 37w1dbeing seen today for ongoing prenatal care.  She is currently monitored for the following issues for this high-risk pregnancy and has Depression with anxiety; Morbid obesity with body mass index of 45.0-49.9 in adult ([facility]; History of gestational hypertension; History of cesarean section; History of VBAC; De Quervain's tenosynovitis; Supervision of high risk pregnancy, antepartum; Seizure disorder during pregnancy, antepartum ([place]; Chronic hypertension affecting pregnancy; and Unwanted fertility on their problem list.  Patient reports no complaints.  Contractions: Not present. Vag. Bleeding: None.  Movement: Present. Denies leaking of fluid.   The following portions of the patient's history were reviewed and updated as appropriate: allergies, current medications, past family history, past medical history, past social history, past surgical history and problem list. Problem list updated.  Objective:   Vitals:   03/24/22 1029 03/24/22 1034  BP: (!) 149/90 137/85  Pulse: (!) 101 (!) 103  Weight: (!) 318 lb (144.2 kg)     Fetal Status: Fetal Heart Rate (bpm): 160   Movement: Present     General:  Alert, oriented and cooperative. Patient is in no acute distress.  Skin: Skin is warm and dry. No rash noted.   Cardiovascular: Normal heart rate noted  Respiratory: Normal respiratory effort, no problems with respiration noted  Abdomen: Soft, gravid, appropriate for gestational age. Pain/Pressure: Present     Pelvic:  Cervical exam deferred        Extremities: Normal range of motion.     Mental Status: Normal mood and affect. Normal behavior. Normal judgment and thought content.   Urinalysis:      Assessment and Plan:  Pregnancy: [id number] 324w1d1. Supervision of high risk pregnancy, antepartum Stable  2. Seizure disorder during pregnancy, antepartum ([place] Importance of taking medication reviewed with  pt  3. History of cesarean section For VBAC, consented today  4. Chronic hypertension affecting pregnancy Takes BP meds some times. Importance of taking mediation reviewed Serial growth scans and antenatal testing as per MFM protocol scheduled   5. Morbid obesity with body mass index of 45.0-49.9 in adult ([facility]  6. Unwanted fertility BTL papers signed today  Preterm labor symptoms and general obstetric precautions including but not limited to vaginal bleeding, contractions, leaking of fluid and fetal movement were reviewed in detail with the patient. Please refer to After Visit Summary for other counseling recommendations.  Return in about 2 weeks (around 04/07/2022) for OB visit, face to face, MD only.   ErChancy MilroyMD

## 2022-04-05 ENCOUNTER — Ambulatory Visit: Payer: Medicaid Other | Admitting: *Deleted

## 2022-04-05 ENCOUNTER — Encounter: Payer: Self-pay | Admitting: *Deleted

## 2022-04-05 ENCOUNTER — Ambulatory Visit: Payer: Medicaid Other | Attending: Obstetrics and Gynecology

## 2022-04-05 DIAGNOSIS — G40909 Epilepsy, unspecified, not intractable, without status epilepticus: Secondary | ICD-10-CM

## 2022-04-05 DIAGNOSIS — O09523 Supervision of elderly multigravida, third trimester: Secondary | ICD-10-CM

## 2022-04-05 DIAGNOSIS — O10919 Unspecified pre-existing hypertension complicating pregnancy, unspecified trimester: Secondary | ICD-10-CM | POA: Diagnosis present

## 2022-04-05 DIAGNOSIS — O99353 Diseases of the nervous system complicating pregnancy, third trimester: Secondary | ICD-10-CM | POA: Diagnosis not present

## 2022-04-05 DIAGNOSIS — E669 Obesity, unspecified: Secondary | ICD-10-CM

## 2022-04-05 DIAGNOSIS — O34219 Maternal care for unspecified type scar from previous cesarean delivery: Secondary | ICD-10-CM

## 2022-04-05 DIAGNOSIS — O0943 Supervision of pregnancy with grand multiparity, third trimester: Secondary | ICD-10-CM

## 2022-04-05 DIAGNOSIS — O99213 Obesity complicating pregnancy, third trimester: Secondary | ICD-10-CM

## 2022-04-05 DIAGNOSIS — Z3A32 32 weeks gestation of pregnancy: Secondary | ICD-10-CM

## 2022-04-05 DIAGNOSIS — O10013 Pre-existing essential hypertension complicating pregnancy, third trimester: Secondary | ICD-10-CM

## 2022-04-08 ENCOUNTER — Ambulatory Visit (INDEPENDENT_AMBULATORY_CARE_PROVIDER_SITE_OTHER): Payer: Medicaid Other | Admitting: Obstetrics & Gynecology

## 2022-04-08 ENCOUNTER — Encounter: Payer: Self-pay | Admitting: Obstetrics & Gynecology

## 2022-04-08 VITALS — BP 141/84 | HR 109 | Wt 320.0 lb

## 2022-04-08 DIAGNOSIS — Z3A32 32 weeks gestation of pregnancy: Secondary | ICD-10-CM

## 2022-04-08 DIAGNOSIS — O10913 Unspecified pre-existing hypertension complicating pregnancy, third trimester: Secondary | ICD-10-CM

## 2022-04-08 DIAGNOSIS — O99353 Diseases of the nervous system complicating pregnancy, third trimester: Secondary | ICD-10-CM

## 2022-04-08 DIAGNOSIS — Z98891 History of uterine scar from previous surgery: Secondary | ICD-10-CM

## 2022-04-08 DIAGNOSIS — O0990 Supervision of high risk pregnancy, unspecified, unspecified trimester: Secondary | ICD-10-CM

## 2022-04-08 DIAGNOSIS — G40909 Epilepsy, unspecified, not intractable, without status epilepticus: Secondary | ICD-10-CM

## 2022-04-08 DIAGNOSIS — O10919 Unspecified pre-existing hypertension complicating pregnancy, unspecified trimester: Secondary | ICD-10-CM

## 2022-04-08 DIAGNOSIS — O34211 Maternal care for low transverse scar from previous cesarean delivery: Secondary | ICD-10-CM

## 2022-04-08 MED ORDER — NIFEDIPINE ER OSMOTIC RELEASE 30 MG PO TB24: 30.0000 mg | ORAL_TABLET | Freq: Every day | ORAL | 2 refills | Status: DC

## 2022-04-08 NOTE — Progress Notes (Signed)
   PRENATAL VISIT NOTE  Subjective:  [name] is a 42 y.o. [phone] at [redacted]w[redacted]d being seen today for ongoing prenatal care.  She is currently monitored for the following issues for this high-risk pregnancy and has Depression with anxiety; Morbid obesity with body mass index of 45.0-49.9 in adult [facility]); History of gestational hypertension; History of cesarean section; History of VBAC; De Quervain's tenosynovitis; Supervision of high risk pregnancy, antepartum; Seizure disorder during pregnancy, antepartum ([place]); Chronic hypertension affecting pregnancy; and Unwanted fertility on their problem list.  Patient reports  that she stopped her labetalol due to side effect-fatigue .  Contractions: Irregular. Vag. Bleeding: None.  Movement: Present. Denies leaking of fluid.   The following portions of the patient's history were reviewed and updated as appropriate: allergies, current medications, past family history, past medical history, past social history, past surgical history and problem list.   Objective:   Vitals:   04/08/22 1047 04/08/22 1052  BP: (!) 152/84 (!) 141/84  Pulse: (!) 111 (!) 109  Weight: (!) 145.2 kg     Fetal Status: Fetal Heart Rate (bpm): 140   Movement: Present     General:  Alert, oriented and cooperative. Patient is in no acute distress.  Skin: Skin is warm and dry. No rash noted.   Cardiovascular: Normal heart rate noted  Respiratory: Normal respiratory effort, no problems with respiration noted  Abdomen: Soft, gravid, appropriate for gestational age.  Pain/Pressure: Present     Pelvic: Cervical exam deferred        Extremities: Normal range of motion.  Edema: Deep pitting, indentation remains for a short time  Mental Status: Normal mood and affect. Normal behavior. Normal judgment and thought content.   Assessment and Plan:  Pregnancy: [id number] at [redacted]w[redacted]d 1. Supervision of high risk pregnancy, antepartum Need to change BP medication - POCT Urinalysis Dipstick -  NIFEdipine (PROCARDIA XL) 30 MG 24 hr tablet; Take 1 tablet (30 mg total) by mouth daily.  Dispense: 30 tablet; Refill: 2  2. History of cesarean section TOLAC  3. Seizure disorder during pregnancy, antepartum ([place]) Keppra well controlled  4. Chronic hypertension affecting pregnancy Procardia - POCT Urinalysis Dipstick  Preterm labor symptoms and general obstetric precautions including but not limited to vaginal bleeding, contractions, leaking of fluid and fetal movement were reviewed in detail with the patient. Please refer to After Visit Summary for other counseling recommendations.  I apologized for her wait in the exam room today and she spoke to [employer] Return in about 2 weeks (around 04/22/2022).  Future Appointments  Date Time Provider [place]  04/13/2022  1:30 PM [facility] NURSE [facility]  04/13/2022  1:45 PM [facility] US4 [facility]  04/20/2022 12:30 PM [facility] NURSE [facility]  04/20/2022 12:45 PM [facility] US5 [facility]  04/22/2022 10:55 AM Woodroe Mode, MD [place] None  06/15/2022 11:30 AM Cameron Sprang, MD [facility] None    [name], MD

## 2022-04-08 NOTE — Progress Notes (Signed)
Pt presents for reports HA's.  She is not taking BP meds.

## 2022-04-13 ENCOUNTER — Ambulatory Visit: Payer: Medicaid Other | Attending: Obstetrics and Gynecology

## 2022-04-13 ENCOUNTER — Ambulatory Visit: Payer: Medicaid Other | Admitting: *Deleted

## 2022-04-13 VITALS — BP 125/74 | HR 106

## 2022-04-13 DIAGNOSIS — E669 Obesity, unspecified: Secondary | ICD-10-CM

## 2022-04-13 DIAGNOSIS — O99353 Diseases of the nervous system complicating pregnancy, third trimester: Secondary | ICD-10-CM

## 2022-04-13 DIAGNOSIS — O10013 Pre-existing essential hypertension complicating pregnancy, third trimester: Secondary | ICD-10-CM | POA: Diagnosis not present

## 2022-04-13 DIAGNOSIS — O10919 Unspecified pre-existing hypertension complicating pregnancy, unspecified trimester: Secondary | ICD-10-CM | POA: Diagnosis present

## 2022-04-13 DIAGNOSIS — O99213 Obesity complicating pregnancy, third trimester: Secondary | ICD-10-CM

## 2022-04-13 DIAGNOSIS — Z3A33 33 weeks gestation of pregnancy: Secondary | ICD-10-CM

## 2022-04-13 DIAGNOSIS — O0943 Supervision of pregnancy with grand multiparity, third trimester: Secondary | ICD-10-CM

## 2022-04-13 DIAGNOSIS — G40909 Epilepsy, unspecified, not intractable, without status epilepticus: Secondary | ICD-10-CM | POA: Diagnosis not present

## 2022-04-13 DIAGNOSIS — O09523 Supervision of elderly multigravida, third trimester: Secondary | ICD-10-CM

## 2022-04-13 DIAGNOSIS — O34219 Maternal care for unspecified type scar from previous cesarean delivery: Secondary | ICD-10-CM

## 2022-04-20 ENCOUNTER — Ambulatory Visit: Payer: Medicaid Other

## 2022-04-22 ENCOUNTER — Ambulatory Visit (INDEPENDENT_AMBULATORY_CARE_PROVIDER_SITE_OTHER): Payer: Medicaid Other | Admitting: Obstetrics & Gynecology

## 2022-04-22 VITALS — BP 142/83 | HR 98 | Wt 323.0 lb

## 2022-04-22 DIAGNOSIS — O99353 Diseases of the nervous system complicating pregnancy, third trimester: Secondary | ICD-10-CM

## 2022-04-22 DIAGNOSIS — O10919 Unspecified pre-existing hypertension complicating pregnancy, unspecified trimester: Secondary | ICD-10-CM

## 2022-04-22 DIAGNOSIS — O0990 Supervision of high risk pregnancy, unspecified, unspecified trimester: Secondary | ICD-10-CM

## 2022-04-22 DIAGNOSIS — O10913 Unspecified pre-existing hypertension complicating pregnancy, third trimester: Secondary | ICD-10-CM

## 2022-04-22 DIAGNOSIS — Z3A34 34 weeks gestation of pregnancy: Secondary | ICD-10-CM

## 2022-04-22 DIAGNOSIS — Z98891 History of uterine scar from previous surgery: Secondary | ICD-10-CM

## 2022-04-22 DIAGNOSIS — Z8759 Personal history of other complications of pregnancy, childbirth and the puerperium: Secondary | ICD-10-CM

## 2022-04-22 DIAGNOSIS — G40909 Epilepsy, unspecified, not intractable, without status epilepticus: Secondary | ICD-10-CM

## 2022-04-22 DIAGNOSIS — O0993 Supervision of high risk pregnancy, unspecified, third trimester: Secondary | ICD-10-CM

## 2022-04-22 NOTE — Progress Notes (Signed)
Pt declined nst today.

## 2022-04-22 NOTE — Progress Notes (Signed)
   PRENATAL VISIT NOTE  Subjective:  [name] is a 42 y.o. [phone] at [redacted]w[redacted]d being seen today for ongoing prenatal care.  She is currently monitored for the following issues for this high-risk pregnancy and has Depression with anxiety; Morbid obesity with body mass index of 45.0-49.9 in adult [facility]); History of gestational hypertension; History of cesarean section; History of VBAC; De Quervain's tenosynovitis; Supervision of high risk pregnancy, antepartum; Seizure disorder during pregnancy, antepartum ([place]); Chronic hypertension affecting pregnancy; and Unwanted fertility on their problem list.  Patient reports no complaints.  Contractions: Irregular. Vag. Bleeding: None.  Movement: Present. Denies leaking of fluid.   The following portions of the patient's history were reviewed and updated as appropriate: allergies, current medications, past family history, past medical history, past social history, past surgical history and problem list.   Objective:   Vitals:   04/22/22 1103  BP: (!) 142/83  Pulse: 98  Weight: (!) 323 lb (146.5 kg)    Fetal Status: Fetal Heart Rate (bpm): 160   Movement: Present     General:  Alert, oriented and cooperative. Patient is in no acute distress.  Skin: Skin is warm and dry. No rash noted.   Cardiovascular: Normal heart rate noted  Respiratory: Normal respiratory effort, no problems with respiration noted  Abdomen: Soft, gravid, appropriate for gestational age.  Pain/Pressure: Present     Pelvic: Cervical exam deferred        Extremities: Normal range of motion.     Mental Status: Normal mood and affect. Normal behavior. Normal judgment and thought content.   Assessment and Plan:  Pregnancy: [id number] at [redacted]w[redacted]d 1. Chronic hypertension affecting pregnancy On Procardia  2. Seizure disorder during pregnancy, antepartum ([place]) No medication  3. History of gestational hypertension   4. History of cesarean section TOLAC  5. History of  VBAC TOLAC  6. Supervision of high risk pregnancy, antepartum States she declines to go to MFM for fetal testing against medical advice  Preterm labor symptoms and general obstetric precautions including but not limited to vaginal bleeding, contractions, leaking of fluid and fetal movement were reviewed in detail with the patient. Please refer to After Visit Summary for other counseling recommendations.   Return in about 1 week (around 04/29/2022).  Future Appointments  Date Time Provider [place]  06/15/2022 11:30 AM Cameron Sprang, MD [facility] None    [name], MD

## 2022-04-27 ENCOUNTER — Telehealth: Payer: Self-pay

## 2022-04-27 NOTE — Telephone Encounter (Signed)
[employer] paperwork completed for maternity leave and faxed.  Patient informed

## 2022-05-02 ENCOUNTER — Ambulatory Visit (INDEPENDENT_AMBULATORY_CARE_PROVIDER_SITE_OTHER): Payer: Medicaid Other | Admitting: Obstetrics and Gynecology

## 2022-05-02 ENCOUNTER — Other Ambulatory Visit (HOSPITAL_COMMUNITY)
Admission: RE | Admit: 2022-05-02 | Discharge: 2022-05-02 | Disposition: A | Discharge status: Home / Self Care | Payer: Medicaid Other | Source: Ambulatory Visit | Attending: Obstetrics and Gynecology | Admitting: Obstetrics and Gynecology

## 2022-05-02 ENCOUNTER — Encounter: Payer: Self-pay | Admitting: Obstetrics and Gynecology

## 2022-05-02 VITALS — BP 145/88 | HR 111 | Wt 327.2 lb

## 2022-05-02 DIAGNOSIS — F418 Other specified anxiety disorders: Secondary | ICD-10-CM

## 2022-05-02 DIAGNOSIS — Z3A35 35 weeks gestation of pregnancy: Secondary | ICD-10-CM | POA: Diagnosis not present

## 2022-05-02 DIAGNOSIS — O10913 Unspecified pre-existing hypertension complicating pregnancy, third trimester: Secondary | ICD-10-CM

## 2022-05-02 DIAGNOSIS — O0990 Supervision of high risk pregnancy, unspecified, unspecified trimester: Secondary | ICD-10-CM | POA: Insufficient documentation

## 2022-05-02 DIAGNOSIS — G40909 Epilepsy, unspecified, not intractable, without status epilepticus: Secondary | ICD-10-CM

## 2022-05-02 DIAGNOSIS — O99353 Diseases of the nervous system complicating pregnancy, third trimester: Secondary | ICD-10-CM

## 2022-05-02 DIAGNOSIS — Z6841 Body Mass Index (BMI) 40.0 and over, adult: Secondary | ICD-10-CM

## 2022-05-02 DIAGNOSIS — O10919 Unspecified pre-existing hypertension complicating pregnancy, unspecified trimester: Secondary | ICD-10-CM

## 2022-05-02 DIAGNOSIS — Z98891 History of uterine scar from previous surgery: Secondary | ICD-10-CM

## 2022-05-02 DIAGNOSIS — Z3009 Encounter for other general counseling and advice on contraception: Secondary | ICD-10-CM

## 2022-05-02 MED ORDER — LABETALOL HCL 200 MG PO TABS: 200.0000 mg | ORAL_TABLET | Freq: Three times a day (TID) | ORAL | 0 refills | Status: DC

## 2022-05-02 NOTE — Progress Notes (Signed)
   PRENATAL VISIT NOTE  Subjective:  [name] is a 42 y.o. [phone] at [redacted]w[redacted]d being seen today for ongoing prenatal care.  She is currently monitored for the following issues for this high-risk pregnancy and has Depression with anxiety; Morbid obesity with body mass index of 45.0-49.9 in adult; History of gestational hypertension; History of cesarean section; History of VBAC; De Quervain's tenosynovitis; Supervision of high risk pregnancy, antepartum; Seizure disorder during pregnancy, antepartum; Chronic hypertension affecting pregnancy; and Unwanted fertility on their problem list.  Patient reports  vaginal discharge, swelling in feet, and headaches that she attributes to her procardia .  Contractions: Irritability. Vag. Bleeding: Other (Pink tinged discharge).  Movement: Present. Denies leaking of fluid.   The following portions of the patient's history were reviewed and updated as appropriate: allergies, current medications, past family history, past medical history, past social history, past surgical history and problem list.   Objective:   Vitals:   05/02/22 1041  BP: (!) 145/88  Pulse: (!) 111  Weight: (!) 327 lb 3.2 oz (148.4 kg)   Fetal Status: Fetal Heart Rate (bpm): 146   Movement: Present     General:  Alert, oriented and cooperative. Patient is in no acute distress.  Skin: Skin is warm and dry. No rash noted.   Cardiovascular: Normal heart rate noted  Respiratory: Normal respiratory effort, no problems with respiration noted  Abdomen: Soft, gravid, appropriate for gestational age.  Pain/Pressure: Present      Assessment and Plan:  Pregnancy: [id number] at [redacted]w[redacted]d 1. Supervision of high risk pregnancy, antepartum 2. [redacted] weeks gestation of pregnancy NST reactive and reassuring today - Culture, beta strep (group b only) - Cervicovaginal ancillary only( [place])  3. Chronic hypertension affecting pregnancy - Mild range blood pressures today on procardia XL 30mg  daily.  Will obtain preE labs and P/C.  - Declined weekly BPPs with MFM, will do weekly NST w/ AFI here - Reports headaches from procardia and desires to switch medication rather than increase dose for BP control. Start labetalol 200mg  TID. Will f/u with patient in 2-3 days check in on blood pressures. May need to titrate further - Pt interested in [place] since this is what she had in two prior pregnancies. Discussed that we will need to follow up her BP control & antenatal testing before making final decision on delivery timing - CBC - Comp Met (CMET) - Protein / creatinine ratio, urine - [place] MFM FETAL BPP WO NON STRESS; Standing  4. Seizure disorder during pregnancy, antepartum ?Keppra  5. Unwanted fertility Signed MA 31  6. History of cesarean section 7. History of VBAC For TOLAC  8. Morbid obesity with body mass index of 45.0-49.9 in adult  9. Depression with anxiety  Return in about 1 week (around 05/09/2022) for return OB with NST & AFI  OR   BPP.  Future Appointments  Date Time Provider [place]  05/09/2022 11:15 AM Constant, Vickii Chafe, MD [place] None  06/15/2022 11:30 AM Cameron Sprang, MD [facility] None   Inez Catalina, MD

## 2022-05-02 NOTE — Progress Notes (Signed)
Pt presents for ROB visit. Pt c/o pink/red mucousy discharge, headaches and increased swelling in the feet.

## 2022-05-03 LAB — CERVICOVAGINAL ANCILLARY ONLY
Chlamydia: NEGATIVE
Comment: NEGATIVE
Comment: NORMAL
Neisseria Gonorrhea: NEGATIVE

## 2022-05-04 ENCOUNTER — Encounter: Payer: Self-pay | Admitting: Obstetrics and Gynecology

## 2022-05-04 ENCOUNTER — Other Ambulatory Visit: Payer: Self-pay

## 2022-05-04 DIAGNOSIS — O0990 Supervision of high risk pregnancy, unspecified, unspecified trimester: Secondary | ICD-10-CM

## 2022-05-04 LAB — CBC
Hematocrit: 32.8 % — ABNORMAL LOW (ref 34.0–46.6)
Hemoglobin: 10.6 g/dL — ABNORMAL LOW (ref 11.1–15.9)
MCH: 23.8 pg — ABNORMAL LOW (ref 26.6–33.0)
MCHC: 32.3 g/dL (ref 31.5–35.7)
MCV: 74 fL — ABNORMAL LOW (ref 79–97)
Platelets: 326 10*3/uL (ref 150–450)
RBC: 4.45 x10E6/uL (ref 3.77–5.28)
RDW: 13.3 % (ref 11.7–15.4)
WBC: 9.5 10*3/uL (ref 3.4–10.8)

## 2022-05-04 LAB — COMPREHENSIVE METABOLIC PANEL
ALT: 20 IU/L (ref 0–32)
AST: 17 IU/L (ref 0–40)
Albumin/Globulin Ratio: 1.3 (ref 1.2–2.2)
Albumin: 3.7 g/dL — ABNORMAL LOW (ref 3.9–4.9)
Alkaline Phosphatase: 143 IU/L — ABNORMAL HIGH (ref 44–121)
BUN/Creatinine Ratio: 10 (ref 9–23)
BUN: 7 mg/dL (ref 6–20)
Bilirubin Total: 0.5 mg/dL (ref 0.0–1.2)
CO2: 18 mmol/L — ABNORMAL LOW (ref 20–29)
Calcium: 9.7 mg/dL (ref 8.7–10.2)
Chloride: 100 mmol/L (ref 96–106)
Creatinine, Ser: 0.72 mg/dL (ref 0.57–1.00)
Globulin, Total: 2.9 g/dL (ref 1.5–4.5)
Glucose: 144 mg/dL — ABNORMAL HIGH (ref 70–99)
Potassium: 4.3 mmol/L (ref 3.5–5.2)
Sodium: 133 mmol/L — ABNORMAL LOW (ref 134–144)
Total Protein: 6.6 g/dL (ref 6.0–8.5)
eGFR: 109 mL/min/{1.73_m2} (ref >59)

## 2022-05-04 LAB — PROTEIN / CREATININE RATIO, URINE
Creatinine, Urine: 122.1 mg/dL
Protein, Ur: 20.2 mg/dL
Protein/Creat Ratio: 165 mg/g creat (ref 0–200)

## 2022-05-04 MED ORDER — BLOOD PRESSURE KIT DEVI
1.0000 | 0 refills | Status: AC
Start: 2022-05-04 — End: ?

## 2022-05-05 ENCOUNTER — Inpatient Hospital Stay (HOSPITAL_COMMUNITY)
Admission: AD | Admit: 2022-05-05 | Discharge: 2022-05-05 | Disposition: A | Discharge status: Home / Self Care | Payer: Medicaid Other | Attending: Family Medicine | Admitting: Family Medicine

## 2022-05-05 ENCOUNTER — Encounter (HOSPITAL_COMMUNITY): Payer: Self-pay | Admitting: Family Medicine

## 2022-05-05 DIAGNOSIS — H538 Other visual disturbances: Secondary | ICD-10-CM | POA: Diagnosis not present

## 2022-05-05 DIAGNOSIS — R609 Edema, unspecified: Secondary | ICD-10-CM | POA: Diagnosis not present

## 2022-05-05 DIAGNOSIS — O09523 Supervision of elderly multigravida, third trimester: Secondary | ICD-10-CM | POA: Diagnosis not present

## 2022-05-05 DIAGNOSIS — O10913 Unspecified pre-existing hypertension complicating pregnancy, third trimester: Secondary | ICD-10-CM | POA: Diagnosis not present

## 2022-05-05 DIAGNOSIS — O10919 Unspecified pre-existing hypertension complicating pregnancy, unspecified trimester: Secondary | ICD-10-CM

## 2022-05-05 DIAGNOSIS — Z79899 Other long term (current) drug therapy: Secondary | ICD-10-CM | POA: Diagnosis not present

## 2022-05-05 DIAGNOSIS — Z3A36 36 weeks gestation of pregnancy: Secondary | ICD-10-CM | POA: Diagnosis not present

## 2022-05-05 DIAGNOSIS — O26893 Other specified pregnancy related conditions, third trimester: Secondary | ICD-10-CM | POA: Diagnosis not present

## 2022-05-05 DIAGNOSIS — R519 Headache, unspecified: Secondary | ICD-10-CM

## 2022-05-05 LAB — PROTEIN / CREATININE RATIO, URINE
Creatinine, Urine: 55 mg/dL
Protein Creatinine Ratio: 0.22 mg/mg{Cre} — ABNORMAL HIGH (ref 0.00–0.15)
Total Protein, Urine: 12 mg/dL

## 2022-05-05 LAB — URINALYSIS, ROUTINE W REFLEX MICROSCOPIC
Bilirubin Urine: NEGATIVE
Glucose, UA: NEGATIVE mg/dL
Hgb urine dipstick: NEGATIVE
Ketones, ur: NEGATIVE mg/dL
Leukocytes,Ua: NEGATIVE
Nitrite: NEGATIVE
Protein, ur: NEGATIVE mg/dL
Specific Gravity, Urine: 1.009 (ref 1.005–1.030)
pH: 6 (ref 5.0–8.0)

## 2022-05-05 MED ORDER — ACETAMINOPHEN-CAFFEINE 500-65 MG PO TABS
2.0000 | ORAL_TABLET | Freq: Once | ORAL | Status: AC
  Administered 2022-05-05: 2 via ORAL
  Filled 2022-05-05: qty 2

## 2022-05-05 MED ORDER — LACTATED RINGERS IV BOLUS: 1000.0000 mL | Freq: Once | INTRAVENOUS | Status: DC

## 2022-05-05 MED ORDER — METOCLOPRAMIDE HCL 5 MG/ML IJ SOLN
10.0000 mg | Freq: Once | INTRAMUSCULAR | Status: DC
  Filled 2022-05-05: qty 2

## 2022-05-05 MED ORDER — DIPHENHYDRAMINE HCL 50 MG/ML IJ SOLN
25.0000 mg | Freq: Once | INTRAMUSCULAR | Status: DC
  Filled 2022-05-05: qty 1

## 2022-05-05 MED ORDER — CYCLOBENZAPRINE HCL 10 MG PO TABS: 10.0000 mg | ORAL_TABLET | Freq: Two times a day (BID) | ORAL | 0 refills | Status: DC | PRN

## 2022-05-05 NOTE — MAU Note (Signed)
[name] is a 42 y.o. at [redacted]w[redacted]d here in MAU reporting: HBP. Pt states she took her BP at home and it was 156/102. She waited alittle bit and took it again and it was 171/100. Pt went to Urgent Care to confirm the high BP and at Urgent Care pt states her BP was 156/89. Pt denies VB or LOF. +FM .   Onset of complaint: 05/05/2022 Pain score: 5/10 headache Vitals:   05/05/22 1654 05/05/22 1657  BP: 128/83 128/83  Pulse: (!) 112 (!) 115  Resp: 20   Temp: 98.3 F (36.8 C)   SpO2: 96% 99%     FHT:151 Lab orders placed from triage:  Urine

## 2022-05-05 NOTE — Discharge Instructions (Signed)

## 2022-05-05 NOTE — MAU Provider Note (Addendum)
History     CSN: [id number]  Arrival date and time: 05/05/22 1624   Event Date/Time   First Provider Initiated Contact with Patient 05/05/22 1720      Chief Complaint  Patient presents with   Hypertension   [name] is a 42 y.o. [id number] at [redacted]w[redacted]d who receives care at [facility].  She presents today for HTN mgmt.  She states she had some elevated blood pressure at home around 0845 it was 156/102.  She states she took her labetalol and ate, then retook her blood pressure and it was 180/102.  She states she has a normal sized cuff at home that she received yesterday.  She states she went to urgent care and it was 156/89. She reports her blood pressures range 145-150ish during her prenatal visits.  She states she has been having daily HA and took Tylenol XR 2 tablets with some relief.  She states initially the HA was a 10/10, but is now a 6/10.  She describes them as a "real bad pain... like a throbbing."  She states she also has blurry vision and spots as well.  She states this has been occurring for "awhile."  She goes on to clarify that it has been about 2 months.   OB History     Gravida  9   Para  5   Term  5   Preterm  0   AB  3   Living  4      SAB  2   IAB  1   Ectopic  0   Multiple  0   Live Births  5           Past Medical History:  Diagnosis Date   Depression with anxiety 07/04/2013   Heart murmur    Hypertension    Morbid obesity 03/21/2013   Postpartum depression    After fetal loss in 2005 due to anomalies    Past Surgical History:  Procedure Laterality Date   BREAST SURGERY  2005   L breast infection after loss   CESAREAN SECTION  02/24/2011   Procedure: CESAREAN SECTION;  Surgeon: Shelly Bombard, MD;  Location: [facility];  Service: Gynecology;  Laterality: N/A;  Primary, cord ph 7.29    Family History  Problem Relation Age of Onset   Hypertension Mother    Hypertension Maternal Aunt    Diabetes Maternal Aunt    Breast cancer Maternal  Aunt        2 maternal aunts with breast cancer diagnosed at 38 and 40 yo   Hypertension Maternal Grandmother    Hypertension Maternal Grandfather    Cancer Maternal Grandfather        lung   Colon cancer Maternal Grandfather        died in his 26's.    Asthma Neg Hx     Social History   Tobacco Use   Smoking status: Former    Types: Cigarettes, Cigars    Quit date: 07/2021    Years since quitting: 0.8    Passive exposure: Never   Smokeless tobacco: Former   Tobacco comments:    1/2 cigar a day  Vaping Use   Vaping Use: Never used  Substance Use Topics   Alcohol use: No    Comment: occasional, social drinking   Drug use: Not Currently    Comment: some times if in pain    Allergies:  Allergies  Allergen Reactions   Codeine Nausea And Vomiting  Per patient, "I cannot take percocet."    Medications Prior to Admission  Medication Sig Dispense Refill Last Dose   acetaminophen (TYLENOL) 325 MG tablet Take 650 mg by mouth every 6 (six) hours as needed for mild pain or headache.   05/05/2022 at 1100   aspirin EC 81 MG tablet Take 81 mg by mouth daily.   [id number]   folic acid (FOLVITE) 1 MG tablet Take 1 tablet by mouth daily.   05/05/2022 at 0900   labetalol (NORMODYNE) 200 MG tablet Take 1 tablet (200 mg total) by mouth 3 (three) times daily. 120 tablet 0 05/05/2022 at 1200   prenatal vitamin w/FE, FA (PRENATAL 1 + 1) 27-1 MG TABS tablet Take 1 tablet by mouth daily at 12 noon.   05/05/2022 at 0900   Blood Pressure Monitoring (BLOOD PRESSURE KIT) DEVI 1 kit by Does not apply route once a week. 1 each 0    NIFEdipine (PROCARDIA XL) 30 MG 24 hr tablet Take 1 tablet (30 mg total) by mouth daily. 30 tablet 2     Review of Systems  Gastrointestinal:  Negative for abdominal pain, nausea and vomiting.  Genitourinary:  Negative for vaginal bleeding and vaginal discharge.  Neurological:  Positive for headaches (Frontal, 6/10,).   Physical Exam   Blood pressure 132/80, pulse (!) 107,  temperature 98.3 F (36.8 C), temperature source Oral, resp. rate 20, height 5\' 6"  (1.676 m), weight (!) 149.1 kg, last menstrual period 08/19/2021, SpO2 99 %, currently breastfeeding.  Vitals:   05/05/22 1654 05/05/22 1657 05/05/22 1701 05/05/22 1716  BP: 128/83 128/83 132/80 (!) 123/56   05/05/22 1731  BP: 112/65     Physical Exam Vitals reviewed.  Constitutional:      Appearance: Normal appearance.  HENT:     Head: Normocephalic and atraumatic.  Eyes:     Conjunctiva/sclera: Conjunctivae normal.  Cardiovascular:     Rate and Rhythm: Normal rate.  Abdominal:     General: Bowel sounds are normal.  Musculoskeletal:     Cervical back: Normal range of motion.     Right lower leg: Edema present.     Left lower leg: Edema present.  Skin:    General: Skin is warm and dry.  Neurological:     Mental Status: She is alert and oriented to person, place, and time.  Psychiatric:        Mood and Affect: Mood normal.        Behavior: Behavior normal.     Fetal Assessment 145 bpm, Mod Var, -Decels, +Accels Toco: No ctx graphed  MAU Course  No results found for this or any previous visit (from the past 24 hour(s)). No results found.  MDM PE Labs: None EFM Pain Medication Assessment and Plan  42 year old [id number]  SIUP at 36.1 weeks Cat I FT Headache CHTN  -POC Reviewed. -Exam performed and findings discussed. -Informed that not uncommon for HA to occur on certain medications. -Discussed treatment with IV and patient agreeable. -Cautioned that can cause drowsiness and patient states she can get SO to pick her up if necessary. -Further reassured that normal to experience some edema in late 3rd trimester. Encouraged elevating feet. -Informed that elevated BP may be due to improper cuff size.  Instructed to contact office about findings larger bp cuff.  -Discussed obtaining PreE labs for rule out, but reassured that bps normal. -Patient agreeable. -NST reactive.     Maryann Conners MSN, CNM 05/05/2022, 5:20 PM  1745:  -Nurse states patient declines IV therapy and labs. -Will give Excedrin migraine and reassess.  -Report given to C.Maryruth Hancock, CNM  Maryann Conners MSN, CNM Advanced Practice Provider, Center for [facility]  Headache now a 1/10. Patient reports significant improvement.   1. Pregnancy headache in third trimester   2. Chronic hypertension affecting pregnancy   3. [redacted] weeks gestation of pregnancy    -Discharge home in stable condition -Rx for flexeril sent to pharmacy -Third trimester precautions discussed -Patient advised to follow-up with OB as scheduled for prenatal care -Patient may return to MAU as needed or if her condition were to change or worsen  Wende Mott, CNM 05/05/22 7:23 PM

## 2022-05-06 ENCOUNTER — Other Ambulatory Visit: Payer: Self-pay

## 2022-05-06 LAB — CULTURE, BETA STREP (GROUP B ONLY): Strep Gp B Culture: NEGATIVE

## 2022-05-06 NOTE — Progress Notes (Signed)
Breast pump faxed to [employer]

## 2022-05-09 ENCOUNTER — Ambulatory Visit (INDEPENDENT_AMBULATORY_CARE_PROVIDER_SITE_OTHER): Payer: Medicaid Other | Admitting: Obstetrics and Gynecology

## 2022-05-09 ENCOUNTER — Encounter: Payer: Self-pay | Admitting: Obstetrics and Gynecology

## 2022-05-09 VITALS — BP 137/83 | HR 112 | Wt 330.8 lb

## 2022-05-09 DIAGNOSIS — Z98891 History of uterine scar from previous surgery: Secondary | ICD-10-CM

## 2022-05-09 DIAGNOSIS — O99353 Diseases of the nervous system complicating pregnancy, third trimester: Secondary | ICD-10-CM

## 2022-05-09 DIAGNOSIS — O10919 Unspecified pre-existing hypertension complicating pregnancy, unspecified trimester: Secondary | ICD-10-CM

## 2022-05-09 DIAGNOSIS — O0990 Supervision of high risk pregnancy, unspecified, unspecified trimester: Secondary | ICD-10-CM

## 2022-05-09 DIAGNOSIS — Z6841 Body Mass Index (BMI) 40.0 and over, adult: Secondary | ICD-10-CM

## 2022-05-09 DIAGNOSIS — G40909 Epilepsy, unspecified, not intractable, without status epilepticus: Secondary | ICD-10-CM

## 2022-05-09 DIAGNOSIS — Z3A36 36 weeks gestation of pregnancy: Secondary | ICD-10-CM

## 2022-05-09 DIAGNOSIS — Z3009 Encounter for other general counseling and advice on contraception: Secondary | ICD-10-CM

## 2022-05-09 DIAGNOSIS — O10913 Unspecified pre-existing hypertension complicating pregnancy, third trimester: Secondary | ICD-10-CM

## 2022-05-09 NOTE — Progress Notes (Signed)
Pt presents for ROB visit. Pt reports decrease fetal movement. Declines NST. Was seen MAU on 05-05-22.

## 2022-05-09 NOTE — Progress Notes (Addendum)
   PRENATAL VISIT NOTE  Subjective:  [name] is a 42 y.o. [phone] at [redacted]w[redacted]d being seen today for ongoing prenatal care.  She is currently monitored for the following issues for this high-risk pregnancy and has Depression with anxiety; Morbid obesity with body mass index of 45.0-49.9 in adult; History of gestational hypertension; History of cesarean section; History of VBAC; De Quervain's tenosynovitis; Supervision of high risk pregnancy, antepartum; Seizure disorder during pregnancy, antepartum; Chronic hypertension affecting pregnancy; and Unwanted fertility on their problem list.  Patient reports fatigue.  Contractions: Irritability. Vag. Bleeding: None.  Movement: (!) Decreased. Denies leaking of fluid.   The following portions of the patient's history were reviewed and updated as appropriate: allergies, current medications, past family history, past medical history, past social history, past surgical history and problem list.   Objective:   Vitals:   05/09/22 1115  BP: 137/83  Pulse: (!) 112  Weight: (!) 330 lb 12.8 oz (150 kg)    Fetal Status: Fetal Heart Rate (bpm): 142 Fundal Height: 40 cm Movement: (!) Decreased     General:  Alert, oriented and cooperative. Patient is in no acute distress.  Skin: Skin is warm and dry. No rash noted.   Cardiovascular: Normal heart rate noted  Respiratory: Normal respiratory effort, no problems with respiration noted  Abdomen: Soft, gravid, appropriate for gestational age.  Pain/Pressure: Present     Pelvic: Cervical exam deferred        Extremities: Normal range of motion.  Edema: Moderate pitting, indentation subsides rapidly  Mental Status: Normal mood and affect. Normal behavior. Normal judgment and thought content.   Assessment and Plan:  Pregnancy: [id number] at [redacted]w[redacted]d 1. Supervision of high risk pregnancy, antepartum Patient is doing well reporting decrease fetal movement this morning. Patient declined NST and understands that it is to  assess fetal wellbeing in the setting of decrease fetal movement  2. Chronic hypertension affecting pregnancy BP stable on labetalol Plan for IOL at 38 weeks- orders placed NST next visit if patient is willing  3. Seizure disorder during pregnancy, antepartum On keppra   4. History of cesarean section Desires TOLAC- consent previously signed  5. Unwanted fertility Consent previously signed  6. Morbid obesity with body mass index of 45.0-49.9 in adult Continue ASA  Preterm labor symptoms and general obstetric precautions including but not limited to vaginal bleeding, contractions, leaking of fluid and fetal movement were reviewed in detail with the patient. Please refer to After Visit Summary for other counseling recommendations.   Return in about 1 week (around 05/16/2022) for in person, ROB, High risk, NST.  Future Appointments  Date Time Provider Department Center  05/16/2022 10:35 AM Hermina Staggers, MD [facility] None  06/15/2022 11:30 AM Van Clines, MD [facility] None    Catalina Antigua, MD

## 2022-05-11 ENCOUNTER — Other Ambulatory Visit: Payer: Self-pay | Admitting: Advanced Practice Midwife

## 2022-05-11 ENCOUNTER — Encounter (HOSPITAL_COMMUNITY): Payer: Self-pay

## 2022-05-11 ENCOUNTER — Telehealth (HOSPITAL_COMMUNITY): Payer: Self-pay | Admitting: *Deleted

## 2022-05-11 NOTE — Telephone Encounter (Signed)
Preadmission screen  

## 2022-05-12 ENCOUNTER — Telehealth (HOSPITAL_COMMUNITY): Payer: Self-pay | Admitting: *Deleted

## 2022-05-12 ENCOUNTER — Encounter (HOSPITAL_COMMUNITY): Payer: Self-pay | Admitting: *Deleted

## 2022-05-12 NOTE — Telephone Encounter (Signed)
Preadmission screen  

## 2022-05-13 ENCOUNTER — Inpatient Hospital Stay (HOSPITAL_COMMUNITY): Payer: Medicaid Other | Admitting: Anesthesiology

## 2022-05-13 ENCOUNTER — Encounter (HOSPITAL_COMMUNITY): Payer: Self-pay | Admitting: Obstetrics and Gynecology

## 2022-05-13 ENCOUNTER — Other Ambulatory Visit: Payer: Self-pay

## 2022-05-13 ENCOUNTER — Inpatient Hospital Stay (HOSPITAL_COMMUNITY)
Admission: AD | Admit: 2022-05-13 | Discharge: 2022-05-16 | DRG: 806 | Disposition: A | Discharge status: Home / Self Care | Payer: Medicaid Other | Attending: Obstetrics & Gynecology | Admitting: Obstetrics & Gynecology

## 2022-05-13 DIAGNOSIS — Z3A38 38 weeks gestation of pregnancy: Secondary | ICD-10-CM

## 2022-05-13 DIAGNOSIS — O34219 Maternal care for unspecified type scar from previous cesarean delivery: Secondary | ICD-10-CM | POA: Diagnosis present

## 2022-05-13 DIAGNOSIS — F32A Depression, unspecified: Secondary | ICD-10-CM | POA: Diagnosis present

## 2022-05-13 DIAGNOSIS — Z3490 Encounter for supervision of normal pregnancy, unspecified, unspecified trimester: Secondary | ICD-10-CM | POA: Diagnosis present

## 2022-05-13 DIAGNOSIS — O99214 Obesity complicating childbirth: Secondary | ICD-10-CM | POA: Diagnosis present

## 2022-05-13 DIAGNOSIS — O34211 Maternal care for low transverse scar from previous cesarean delivery: Secondary | ICD-10-CM | POA: Diagnosis not present

## 2022-05-13 DIAGNOSIS — O1414 Severe pre-eclampsia complicating childbirth: Secondary | ICD-10-CM | POA: Diagnosis present

## 2022-05-13 DIAGNOSIS — Z98891 History of uterine scar from previous surgery: Secondary | ICD-10-CM

## 2022-05-13 DIAGNOSIS — F418 Other specified anxiety disorders: Secondary | ICD-10-CM | POA: Diagnosis present

## 2022-05-13 DIAGNOSIS — O99354 Diseases of the nervous system complicating childbirth: Secondary | ICD-10-CM | POA: Diagnosis present

## 2022-05-13 DIAGNOSIS — Z7982 Long term (current) use of aspirin: Secondary | ICD-10-CM | POA: Diagnosis not present

## 2022-05-13 DIAGNOSIS — G40909 Epilepsy, unspecified, not intractable, without status epilepticus: Secondary | ICD-10-CM | POA: Diagnosis present

## 2022-05-13 DIAGNOSIS — Z87891 Personal history of nicotine dependence: Secondary | ICD-10-CM | POA: Diagnosis not present

## 2022-05-13 DIAGNOSIS — O0990 Supervision of high risk pregnancy, unspecified, unspecified trimester: Secondary | ICD-10-CM

## 2022-05-13 DIAGNOSIS — O1490 Unspecified pre-eclampsia, unspecified trimester: Principal | ICD-10-CM | POA: Diagnosis present

## 2022-05-13 LAB — TYPE AND SCREEN
ABO/RH(D): B POS
Antibody Screen: NEGATIVE

## 2022-05-13 LAB — CBC
HCT: 34.7 % — ABNORMAL LOW (ref 36.0–46.0)
Hemoglobin: 11.2 g/dL — ABNORMAL LOW (ref 12.0–15.0)
MCH: 23.7 pg — ABNORMAL LOW (ref 26.0–34.0)
MCHC: 32.3 g/dL (ref 30.0–36.0)
MCV: 73.4 fL — ABNORMAL LOW (ref 80.0–100.0)
Platelets: 346 10*3/uL (ref 150–400)
RBC: 4.73 MIL/uL (ref 3.87–5.11)
RDW: 14.6 % (ref 11.5–15.5)
WBC: 9.5 10*3/uL (ref 4.0–10.5)
nRBC: 0 % (ref 0.0–0.2)

## 2022-05-13 LAB — AMNISURE RUPTURE OF MEMBRANE (ROM) NOT AT ARMC: Amnisure ROM: NEGATIVE

## 2022-05-13 LAB — COMPREHENSIVE METABOLIC PANEL
ALT: 26 U/L (ref 0–44)
AST: 28 U/L (ref 15–41)
Albumin: 2.8 g/dL — ABNORMAL LOW (ref 3.5–5.0)
Alkaline Phosphatase: 131 U/L — ABNORMAL HIGH (ref 38–126)
Anion gap: 13 (ref 5–15)
BUN: 6 mg/dL (ref 6–20)
CO2: 18 mmol/L — ABNORMAL LOW (ref 22–32)
Calcium: 9.5 mg/dL (ref 8.9–10.3)
Chloride: 102 mmol/L (ref 98–111)
Creatinine, Ser: 0.71 mg/dL (ref 0.44–1.00)
GFR, Estimated: >60 mL/min (ref >60)
Glucose, Bld: 104 mg/dL — ABNORMAL HIGH (ref 70–99)
Potassium: 4 mmol/L (ref 3.5–5.1)
Sodium: 133 mmol/L — ABNORMAL LOW (ref 135–145)
Total Bilirubin: 0.7 mg/dL (ref 0.3–1.2)
Total Protein: 7.2 g/dL (ref 6.5–8.1)

## 2022-05-13 LAB — PROTEIN / CREATININE RATIO, URINE
Creatinine, Urine: 42 mg/dL
Protein Creatinine Ratio: 0.24 mg/mg{Cre} — ABNORMAL HIGH (ref 0.00–0.15)
Total Protein, Urine: 10 mg/dL

## 2022-05-13 MED ORDER — LACTATED RINGERS IV SOLN: INTRAVENOUS | Status: DC

## 2022-05-13 MED ORDER — OXYTOCIN-SODIUM CHLORIDE 30-0.9 UT/500ML-% IV SOLN
2.5000 [IU]/h | INTRAVENOUS | Status: DC
  Filled 2022-05-13: qty 500

## 2022-05-13 MED ORDER — NIFEDIPINE 10 MG PO CAPS: 20.0000 mg | ORAL_CAPSULE | ORAL | Status: DC | PRN

## 2022-05-13 MED ORDER — LABETALOL HCL 5 MG/ML IV SOLN: 40.0000 mg | INTRAVENOUS | Status: DC | PRN

## 2022-05-13 MED ORDER — ACETAMINOPHEN 325 MG PO TABS
650.0000 mg | ORAL_TABLET | ORAL | Status: DC | PRN
  Administered 2022-05-13: 650 mg via ORAL
  Filled 2022-05-13: qty 2

## 2022-05-13 MED ORDER — PHENYLEPHRINE 80 MCG/ML (10ML) SYRINGE FOR IV PUSH (FOR BLOOD PRESSURE SUPPORT)
80.0000 ug | PREFILLED_SYRINGE | INTRAVENOUS | Status: AC | PRN
  Administered 2022-05-13: 80 ug via INTRAVENOUS
  Administered 2022-05-13 (×3): 240 ug via INTRAVENOUS
  Administered 2022-05-13: 80 ug via INTRAVENOUS
  Administered 2022-05-13: 180 ug via INTRAVENOUS
  Filled 2022-05-13: qty 10

## 2022-05-13 MED ORDER — LIDOCAINE HCL (PF) 1 % IJ SOLN: 30.0000 mL | INTRAMUSCULAR | Status: DC | PRN

## 2022-05-13 MED ORDER — TERBUTALINE SULFATE 1 MG/ML IJ SOLN
INTRAMUSCULAR | Status: AC
  Administered 2022-05-14: 1 mg
  Filled 2022-05-13: qty 1

## 2022-05-13 MED ORDER — FENTANYL CITRATE (PF) 100 MCG/2ML IJ SOLN
100.0000 ug | INTRAMUSCULAR | Status: DC | PRN
  Administered 2022-05-13: 100 ug via INTRAVENOUS
  Filled 2022-05-13: qty 2

## 2022-05-13 MED ORDER — LEVETIRACETAM ER 500 MG PO TB24
1000.0000 mg | ORAL_TABLET | Freq: Every day | ORAL | Status: DC
  Administered 2022-05-14 – 2022-05-16 (×3): 1000 mg via ORAL
  Filled 2022-05-13 (×3): qty 2

## 2022-05-13 MED ORDER — ONDANSETRON HCL 4 MG/2ML IJ SOLN: 4.0000 mg | Freq: Four times a day (QID) | INTRAMUSCULAR | Status: DC | PRN

## 2022-05-13 MED ORDER — EPHEDRINE 5 MG/ML INJ
10.0000 mg | INTRAVENOUS | Status: AC | PRN
  Administered 2022-05-13 (×3): 10 mg via INTRAVENOUS
  Filled 2022-05-13: qty 5

## 2022-05-13 MED ORDER — FENTANYL-BUPIVACAINE-NACL 0.5-0.125-0.9 MG/250ML-% EP SOLN
EPIDURAL | Status: DC | PRN
  Administered 2022-05-13: 12 mL/h via EPIDURAL

## 2022-05-13 MED ORDER — MAGNESIUM SULFATE BOLUS VIA INFUSION
4.0000 g | Freq: Once | INTRAVENOUS | Status: AC
  Administered 2022-05-13: 4 g via INTRAVENOUS
  Filled 2022-05-13: qty 1000

## 2022-05-13 MED ORDER — OXYCODONE-ACETAMINOPHEN 5-325 MG PO TABS: 1.0000 | ORAL_TABLET | ORAL | Status: DC | PRN

## 2022-05-13 MED ORDER — NIFEDIPINE 10 MG PO CAPS
10.0000 mg | ORAL_CAPSULE | ORAL | Status: DC | PRN
  Administered 2022-05-13: 10 mg via ORAL
  Filled 2022-05-13: qty 1

## 2022-05-13 MED ORDER — SOD CITRATE-CITRIC ACID 500-334 MG/5ML PO SOLN: 30.0000 mL | ORAL | Status: DC | PRN

## 2022-05-13 MED ORDER — EPHEDRINE 5 MG/ML INJ
10.0000 mg | INTRAVENOUS | Status: DC | PRN
  Filled 2022-05-13: qty 5

## 2022-05-13 MED ORDER — LIDOCAINE HCL (PF) 1 % IJ SOLN
INTRAMUSCULAR | Status: DC | PRN
  Administered 2022-05-13: 10 mL via EPIDURAL
  Administered 2022-05-13: 2 mL via EPIDURAL

## 2022-05-13 MED ORDER — OXYTOCIN BOLUS FROM INFUSION
333.0000 mL | Freq: Once | INTRAVENOUS | Status: AC
  Administered 2022-05-14: 333 mL via INTRAVENOUS

## 2022-05-13 MED ORDER — FENTANYL-BUPIVACAINE-NACL 0.5-0.125-0.9 MG/250ML-% EP SOLN
12.0000 mL/h | EPIDURAL | Status: DC | PRN
  Filled 2022-05-13: qty 250

## 2022-05-13 MED ORDER — DIPHENHYDRAMINE HCL 50 MG/ML IJ SOLN: 12.5000 mg | INTRAMUSCULAR | Status: DC | PRN

## 2022-05-13 MED ORDER — LACTATED RINGERS IV SOLN: 500.0000 mL | INTRAVENOUS | Status: DC | PRN

## 2022-05-13 MED ORDER — LACTATED RINGERS IV SOLN
500.0000 mL | Freq: Once | INTRAVENOUS | Status: AC
  Administered 2022-05-13: 500 mL via INTRAVENOUS

## 2022-05-13 MED ORDER — OXYCODONE-ACETAMINOPHEN 5-325 MG PO TABS: 2.0000 | ORAL_TABLET | ORAL | Status: DC | PRN

## 2022-05-13 MED ORDER — PHENYLEPHRINE 80 MCG/ML (10ML) SYRINGE FOR IV PUSH (FOR BLOOD PRESSURE SUPPORT)
80.0000 ug | PREFILLED_SYRINGE | INTRAVENOUS | Status: DC | PRN
  Filled 2022-05-13: qty 10

## 2022-05-13 MED ORDER — MAGNESIUM SULFATE 40 GM/1000ML IV SOLN
2.0000 g/h | INTRAVENOUS | Status: DC
  Filled 2022-05-13: qty 1000

## 2022-05-13 NOTE — Anesthesia Procedure Notes (Signed)
Epidural Patient location during procedure: OB Start time: 05/13/2022 10:55 PM End time: 05/13/2022 11:05 PM  Staffing Anesthesiologist: Lannie Fields, DO Performed: anesthesiologist   Preanesthetic Checklist Completed: patient identified, IV checked, risks and benefits discussed, monitors and equipment checked, pre-op evaluation and timeout performed  Epidural Patient position: sitting Prep: DuraPrep and site prepped and draped Patient monitoring: continuous pulse ox, blood pressure, heart rate and cardiac monitor Approach: midline Location: L3-L4 Injection technique: LOR air  Needle:  Needle type: Tuohy  Needle gauge: 17 G Needle length: 9 cm Needle insertion depth: 9 cm Catheter type: closed end flexible Catheter size: 19 Gauge Catheter at skin depth: 15 cm Test dose: negative  Assessment Sensory level: T8 Events: blood not aspirated, no cerebrospinal fluid, injection not painful, no injection resistance, no paresthesia and negative IV test  Additional Notes Patient identified. Risks/Benefits/Options discussed with patient including but not limited to bleeding, infection, nerve damage, paralysis, failed block, incomplete pain control, headache, blood pressure changes, nausea, vomiting, reactions to medication both or allergic, itching and postpartum back pain. Confirmed with bedside nurse the patient's most recent platelet count. Confirmed with patient that they are not currently taking any anticoagulation, have any bleeding history or any family history of bleeding disorders. Patient expressed understanding and wished to proceed. All questions were answered. Sterile technique was used throughout the entire procedure. Please see nursing notes for vital signs. Test dose was given through epidural catheter and negative prior to continuing to dose epidural or start infusion. Warning signs of high block given to the patient including shortness of breath, tingling/numbness in  hands, complete motor block, or any concerning symptoms with instructions to call for help. Patient was given instructions on fall risk and not to get out of bed. All questions and concerns addressed with instructions to call with any issues or inadequate analgesia.    LOR at 9+++ (maximum skin tenting with 9cm tuohy), may need longer epidural needle in the future. Easy placement. Reason for block:procedure for pain

## 2022-05-13 NOTE — MAU Provider Note (Signed)
Event Date/Time   First Provider Initiated Contact with Patient 05/13/22 1640       S: [name] is a 42 y.o. [id number] at [redacted]w[redacted]d  who presents to MAU today complaining of leaking of fluid since 1400. She denies vaginal bleeding. She endorses contractions. She reports normal fetal movement.    O: BP (!) 142/94   Pulse (!) 102   Temp 98.4 F (36.9 C) (Oral)   Resp 18   LMP 08/19/2021  Patient Vitals for the past 24 hrs:  BP Temp Temp src Pulse Resp  05/13/22 1801 (!) 142/94 -- -- (!) 102 --  05/13/22 1747 (!) 151/76 -- -- (!) 109 --  05/13/22 1727 (!) 151/91 -- -- (!) 114 --  05/13/22 1703 (!) 153/100 -- -- (!) 111 --  05/13/22 1700 (!) 168/96 -- -- (!) 104 --  05/13/22 1644 (!) 157/96 98.4 F (36.9 C) Oral (!) 105 18    GENERAL: Well-developed, well-nourished female in no acute distress.  HEAD: Normocephalic, atraumatic.  CHEST: Normal effort of breathing, regular heart rate ABDOMEN: Soft, nontender, gravid PELVIC: deferred, amnisure collected  Pt informed that the ultrasound is considered a limited OB ultrasound and is not intended to be a complete ultrasound exam.  Patient also informed that the ultrasound is not being completed with the intent of assessing for fetal or placental anomalies or any pelvic abnormalities.  Explained that the purpose of today's ultrasound is to assess for  presentation.  Patient acknowledges the purpose of the exam and the limitations of the study.    -Vertex presentation confirmed  Cervical exam:  Dilation: Closed Station: Ballotable Presentation: Vertex (confirmed by bedside US, via Jerald Kief CNM) Exam by:: Lake Bells CNM   Fetal Monitoring: Baseline: 150 Variability: moderate Accelerations: none Decelerations: none Contractions: 1-5  Results for orders placed or performed during the hospital encounter of 05/13/22 (from the past 24 hour(s))  Amnisure rupture of membrane (rom)not at [facility]     Status: None   Collection Time: 05/13/22   5:12 PM  Result Value Ref Range   Amnisure ROM NEGATIVE      A: SIUP at [redacted]w[redacted]d  Membranes intact NRNST CHTN with severe range BPs  CNM consulted with Dr. Berton Lan regarding presentation, severe range BP and NRNST- recommends admission to labor and delivery for IOL  P: -Admit to labor and delivery -Report called to Dr. Everardo All, Elisha Headland, CNM 05/13/2022 4:40 PM

## 2022-05-13 NOTE — H&P (Signed)
OBSTETRIC ADMISSION HISTORY AND PHYSICAL  [name] is a 42 y.o. female [phone] with IUP at [redacted]w[redacted]d by LMP presenting for PreEclampsia with Severe features. She reports +FMs, No LOF, no VB, no blurry vision, headaches or peripheral edema, and RUQ pain.  She plans on breast and bottle feeding. She request BTL for birth control. She received her prenatal care at  [facility]    Dating: By LMP --->  Estimated Date of Delivery: 05/26/22  Sono:   @[redacted]w[redacted]d , CWD, normal anatomy, 2112g, 22% EFW   Prenatal History/Complications:  Patient Active Problem List   Diagnosis Date Noted   Encounter for induction of labor 05/13/2022   Chronic hypertension affecting pregnancy 03/24/2022   Unwanted fertility 03/24/2022   Supervision of high risk pregnancy, antepartum 02/22/2022   Seizure disorder during pregnancy, antepartum 02/22/2022   De Quervain's tenosynovitis 08/30/2018   History of gestational hypertension 11/15/2017   History of cesarean section 11/15/2017   History of VBAC 11/15/2017   Preeclampsia 02/18/2016   Morbid obesity with body mass index of 45.0-49.9 in adult 01/29/2016   Depression with anxiety 07/04/2013   Nursing Staff Provider  Office Location [facility] Dating  05/25/2022, by Patient Reported  [facility] Model [X]  Traditional [ ]  Centering [ ]  Mom-Baby Dyad    Language  English Anatomy US   Normal, serial growth and antenatal testing as per protocol  Flu Vaccine  Declined 03-10-22 Genetic/Carrier Screen  NIPS:    AFP:   normal Horizon:  TDaP Vaccine   Declined 03-10-22 Hgb A1C or  GTT Early 5.5 Third trimester normal  COVID Vaccine no   LAB RESULTS   Rhogam    NA Blood Type   B positive  Baby Feeding Plan Breast and Bottle Antibody  Negative  Contraception Tubal  Rubella Nonimmune (09/27 0000)  Circumcision No, Girl RPR Non Reactive (02/08 0926)   Pediatrician  [facility] HBsAg Negative (09/27 0000)   Support Person Husband HCVAb     Prenatal Classes No  HIV Non Reactive (02/08  0926)     BTL Consent  03/24/22 GBS Negative/-- (04/01 1129) (For PCN allergy, check sensitivities)   VBAC Consent  03/24/22 Pap Diagnosis  Date Value Ref Range Status  08/28/2018   Final   NEGATIVE FOR INTRAEPITHELIAL LESIONS OR MALIGNANCY.         DME Rx [ ]  BP cuff [ ]  Weight Scale Waterbirth  [ ]  Class [ ]  Consent [ ]  CNM visit  PHQ9 & GAD7 [  ] new OB [ x ] 28 weeks  [  ] 36 weeks Induction  [ ]  Orders Entered [ ] Foley Y/N     Past Medical History: Past Medical History:  Diagnosis Date   Depression with anxiety 07/04/2013   Heart murmur    Hypertension    Morbid obesity 03/21/2013   Postpartum depression    After fetal loss in 2005 due to anomalies   Seizures    started in 2022 last one was 4/23    Past Surgical History: Past Surgical History:  Procedure Laterality Date   BREAST SURGERY  2005   L breast infection after loss   CESAREAN SECTION  02/24/2011   Procedure: CESAREAN SECTION;  Surgeon: Brock Bad, MD;  Location: [facility];  Service: Gynecology;  Laterality: N/A;  Primary, cord ph 7.29    Obstetrical History: OB History     Gravida  9   Para  5   Term  5   Preterm  0  AB  3   Living  4      SAB  2   IAB  1   Ectopic  0   Multiple  0   Live Births  5           Social History Social History   Socioeconomic History   Marital status: Married    Spouse name: Engineer, petroleum   Number of children: 3   Years of education: Not on file   Highest education level: Not on file  Occupational History   Not on file  Tobacco Use   Smoking status: Former    Types: Cigarettes, Cigars    Quit date: 07/2021    Years since quitting: 0.8    Passive exposure: Never   Smokeless tobacco: Former   Tobacco comments:    1/2 cigar a day  Vaping Use   Vaping Use: Never used  Substance and Sexual Activity   Alcohol use: No    Comment: occasional, social drinking   Drug use: Not Currently    Comment: some times if in pain   Sexual  activity: Yes    Birth control/protection: None  Other Topics Concern   Not on file  Social History Narrative   Right   Lives with husband    One story home   Social Determinants of Health   Financial Resource Strain: Low Risk  (05/03/2018)   Overall Financial Resource Strain (CARDIA)    Difficulty of Paying Living Expenses: Not hard at all  Food Insecurity: No Food Insecurity (05/13/2022)   Hunger Vital Sign    Worried About Running Out of Food in the Last Year: Never true    Ran Out of Food in the Last Year: Never true  Transportation Needs: No Transportation Needs (05/13/2022)   PRAPARE - Administrator, Civil Service (Medical): No    Lack of Transportation (Non-Medical): No  Physical Activity: Inactive (05/03/2018)   Exercise Vital Sign    Days of Exercise per Week: 0 days    Minutes of Exercise per Session: 0 min  Stress: No Stress Concern Present (05/03/2018)   [employer] of Occupational Health - Occupational Stress Questionnaire    Feeling of Stress : Only a little  Social Connections: Moderately Integrated (05/03/2018)   Social Connection and Isolation Panel [NHANES]    Frequency of Communication with Friends and Family: Three times a week    Frequency of Social Gatherings with Friends and Family: Three times a week    Attends Religious Services: 1 to 4 times per year    Active Member of Clubs or Organizations: No    Attends Banker Meetings: Never    Marital Status: Married    Family History: Family History  Problem Relation Age of Onset   Hypertension Mother    Hypertension Maternal Aunt    Diabetes Maternal Aunt    Breast cancer Maternal Aunt        2 maternal aunts with breast cancer diagnosed at 67 and 40 yo   Hypertension Maternal Grandmother    Hypertension Maternal Grandfather    Cancer Maternal Grandfather        lung   Colon cancer Maternal Grandfather        died in his 3's.    Asthma Neg Hx     Allergies: Allergies   Allergen Reactions   Codeine Nausea And Vomiting    Per patient, "I cannot take percocet."    Medications Prior to  Admission  Medication Sig Dispense Refill Last Dose   acetaminophen (TYLENOL) 325 MG tablet Take 650 mg by mouth every 6 (six) hours as needed for mild pain or headache.   Past Week   aspirin EC 81 MG tablet Take 81 mg by mouth daily.   05/12/2022 at 2100   Blood Pressure Monitoring (BLOOD PRESSURE KIT) DEVI 1 kit by Does not apply route once a week. 1 each 0 05/13/2022   folic acid (FOLVITE) 1 MG tablet Take 1 tablet by mouth daily.   05/13/2022 at 0900   labetalol (NORMODYNE) 200 MG tablet Take 1 tablet (200 mg total) by mouth 3 (three) times daily. 120 tablet 0 05/13/2022 at 1400   prenatal vitamin w/FE, FA (PRENATAL 1 + 1) 27-1 MG TABS tablet Take 1 tablet by mouth daily at 12 noon.   05/13/2022 at 0900   cyclobenzaprine (FLEXERIL) 10 MG tablet Take 1 tablet (10 mg total) by mouth 2 (two) times daily as needed for muscle spasms. (Patient not taking: Reported on 05/13/2022) 20 tablet 0 Not Taking   NIFEdipine (PROCARDIA XL) 30 MG 24 hr tablet Take 1 tablet (30 mg total) by mouth daily. (Patient not taking: Reported on 05/09/2022) 30 tablet 2      Review of Systems   All systems reviewed and negative except as stated in HPI  Blood pressure 133/78, pulse (!) 109, temperature 98.4 F (36.9 C), temperature source Oral, resp. rate 18, last menstrual period 08/19/2021, SpO2 99 %, currently breastfeeding. General appearance: alert, cooperative, and appears stated age Lungs: normal effort Heart: mildly tachycardic Abdomen: soft, non-tender; bowel sounds normal Pelvic: see below Extremities: Homans sign is negative, no sign of DVT Presentation: cephalic per MAU US Fetal monitoringBaseline: 125 bpm, Variability: Good {> 6 bpm), Accelerations: Reactive, and Decelerations: Absent Uterine activity every 2-3 mins Dilation: 1 Effacement (%): Thick Station: Ballotable Exam by::  Autry-Lott MD   Prenatal labs: ABO, Rh: --/--/B POS (04/12 1755) Antibody: NEG (04/12 1755) Rubella: Nonimmune (09/27 0000) RPR: Non Reactive (02/08 0926)  HBsAg: Negative (09/27 0000)  HIV: Non Reactive (02/08 0926)  GBS: Negative/-- (04/01 1129)  1 hr Glucola nml Genetic screening  nml Anatomy US nml  Prenatal Transfer Tool  Maternal Diabetes: No Genetic Screening: Normal Maternal Ultrasounds/Referrals: Normal Fetal Ultrasounds or other Referrals:  None Maternal Substance Abuse:  No Significant Maternal Medications:  None Significant Maternal Lab Results:  Group B Strep negative Number of Prenatal Visits:greater than 3 verified prenatal visits Other Comments:  None  Results for orders placed or performed during the hospital encounter of 05/13/22 (from the past 24 hour(s))  Amnisure rupture of membrane (rom)not at [facility]   Collection Time: 05/13/22  5:12 PM  Result Value Ref Range   Amnisure ROM NEGATIVE   Protein / creatinine ratio, urine   Collection Time: 05/13/22  5:12 PM  Result Value Ref Range   Creatinine, Urine 42 mg/dL   Total Protein, Urine 10 mg/dL   Protein Creatinine Ratio 0.24 (H) 0.00 - 0.15 mg/mg[Cre]  Comprehensive metabolic panel   Collection Time: 05/13/22  5:21 PM  Result Value Ref Range   Sodium 133 (L) 135 - 145 mmol/L   Potassium 4.0 3.5 - 5.1 mmol/L   Chloride 102 98 - 111 mmol/L   CO2 18 (L) 22 - 32 mmol/L   Glucose, Bld 104 (H) 70 - 99 mg/dL   BUN 6 6 - 20 mg/dL   Creatinine, Ser 5.70 0.44 - 1.00 mg/dL   Calcium 9.5  8.9 - 10.3 mg/dL   Total Protein 7.2 6.5 - 8.1 g/dL   Albumin 2.8 (L) 3.5 - 5.0 g/dL   AST 28 15 - 41 U/L   ALT 26 0 - 44 U/L   Alkaline Phosphatase 131 (H) 38 - 126 U/L   Total Bilirubin 0.7 0.3 - 1.2 mg/dL   GFR, Estimated >40 >98 mL/min   Anion gap 13 5 - 15  CBC   Collection Time: 05/13/22  5:21 PM  Result Value Ref Range   WBC 9.5 4.0 - 10.5 K/uL   RBC 4.73 3.87 - 5.11 MIL/uL   Hemoglobin 11.2 (L) 12.0 - 15.0 g/dL    HCT 11.9 (L) 14.7 - 46.0 %   MCV 73.4 (L) 80.0 - 100.0 fL   MCH 23.7 (L) 26.0 - 34.0 pg   MCHC 32.3 30.0 - 36.0 g/dL   RDW 82.9 56.2 - 13.0 %   Platelets 346 150 - 400 K/uL   nRBC 0.0 0.0 - 0.2 %  Type and screen   Collection Time: 05/13/22  5:55 PM  Result Value Ref Range   ABO/RH(D) B POS    Antibody Screen NEG    Sample Expiration      05/16/2022,2359 Performed at [facility], [address] [place], [place] [address]     Patient Active Problem List   Diagnosis Date Noted   Encounter for induction of labor 05/13/2022   Chronic hypertension affecting pregnancy 03/24/2022   Unwanted fertility 03/24/2022   Supervision of high risk pregnancy, antepartum 02/22/2022   Seizure disorder during pregnancy, antepartum 02/22/2022   De Quervain's tenosynovitis 08/30/2018   History of gestational hypertension 11/15/2017   History of cesarean section 11/15/2017   History of VBAC 11/15/2017   Preeclampsia 02/18/2016   Morbid obesity with body mass index of 45.0-49.9 in adult 01/29/2016   Depression with anxiety 07/04/2013    Assessment/Plan:  [name] is a 42 y.o. [id number] at [redacted]w[redacted]d here for IOL Pre-Eclampsia w/ Severe features  #Labor: Start with dual cytotec 50/25. Assess for FB at next cervical exam.  #Pain: Per pt request #FWB: CAT 1 #ID:  GBS NEG #MOF: Breast/bottle #MOC: BTL consent signed 03/24/22 #Pre-Eclampsia with SF: Pre E labs wnl, but BP in severe range. Labetalol protocol in place. Mag gtt started.   Lyndle Pang Autry-Lott, DO  05/13/2022, 10:26 PM

## 2022-05-13 NOTE — Anesthesia Preprocedure Evaluation (Addendum)
Anesthesia Evaluation  Patient identified by MRN, date of birth, ID band Patient awake    Reviewed: Allergy & Precautions, H&P , NPO status , Patient's Chart, lab work & pertinent test results, reviewed documented beta blocker date and time   Airway Mallampati: IV  TM Distance: >3 FB Neck ROM: Full    Dental no notable dental hx. (+) Teeth Intact, Dental Advisory Given   Pulmonary former smoker Quit smoking 2023   Pulmonary exam normal breath sounds clear to auscultation       Cardiovascular hypertension (cHTN now preE SF), Pt. on medications and Pt. on home beta blockers Normal cardiovascular exam Rhythm:Regular Rate:Normal     Neuro/Psych Seizures -, Well Controlled,  PSYCHIATRIC DISORDERS Anxiety Depression    History of seizures while pregnant, last one 2023. Patient stopped keppra herself about 57mo ago because she thought it might be giving her headaches    GI/Hepatic negative GI ROS, Neg liver ROS,,,  Endo/Other    Morbid obesitySuper morbid obesity BMI 54  Renal/GU negative Renal ROS  negative genitourinary   Musculoskeletal negative musculoskeletal ROS (+)    Abdominal  (+) + obese  Peds negative pediatric ROS (+)  Hematology  (+) Blood dyscrasia, anemia Hb 11.2, plt 346   Anesthesia Other Findings   Reproductive/Obstetrics (+) Pregnancy Grand multip G9 Section 2013 but has successfully VBACed since then                              Anesthesia Physical Anesthesia Plan  ASA: 4  Anesthesia Plan: Epidural   Post-op Pain Management:    Induction:   PONV Risk Score and Plan: 2  Airway Management Planned: Natural Airway  Additional Equipment: None  Intra-op Plan:   Post-operative Plan:   Informed Consent: I have reviewed the patients History and Physical, chart, labs and discussed the procedure including the risks, benefits and alternatives for the proposed anesthesia  with the patient or authorized representative who has indicated his/her understanding and acceptance.       Plan Discussed with:   Anesthesia Plan Comments: (D/w faculty practice would recommend restarting keppra despite being on magnesium gtt for severe preE)       Anesthesia Quick Evaluation

## 2022-05-13 NOTE — MAU Note (Signed)
Rickie Waller is a 41 y.o. at [redacted]w[redacted]d here in MAU reporting: ctx since 1330 today, every . Reports positive FM, denies bleeding.  Trickles of fluid since 1400 today, clear, not enough to saturate a panty liner  Pain score: 6/10 Vitals:   05/13/22 1644  BP: (!) 157/96  Pulse: (!) 105  Resp: 18  Temp: 98.4 F (36.9 C)     FHT:150 bpm

## 2022-05-14 ENCOUNTER — Encounter (HOSPITAL_COMMUNITY): Payer: Self-pay | Admitting: Obstetrics and Gynecology

## 2022-05-14 DIAGNOSIS — Z3A38 38 weeks gestation of pregnancy: Secondary | ICD-10-CM

## 2022-05-14 DIAGNOSIS — O1414 Severe pre-eclampsia complicating childbirth: Secondary | ICD-10-CM

## 2022-05-14 DIAGNOSIS — O34211 Maternal care for low transverse scar from previous cesarean delivery: Secondary | ICD-10-CM

## 2022-05-14 LAB — COMPREHENSIVE METABOLIC PANEL
ALT: 25 U/L (ref 0–44)
AST: 28 U/L (ref 15–41)
Albumin: 2.7 g/dL — ABNORMAL LOW (ref 3.5–5.0)
Alkaline Phosphatase: 138 U/L — ABNORMAL HIGH (ref 38–126)
Anion gap: 13 (ref 5–15)
BUN: 5 mg/dL — ABNORMAL LOW (ref 6–20)
CO2: 17 mmol/L — ABNORMAL LOW (ref 22–32)
Calcium: 8.9 mg/dL (ref 8.9–10.3)
Chloride: 102 mmol/L (ref 98–111)
Creatinine, Ser: 0.78 mg/dL (ref 0.44–1.00)
GFR, Estimated: >60 mL/min (ref >60)
Glucose, Bld: 191 mg/dL — ABNORMAL HIGH (ref 70–99)
Potassium: 3.9 mmol/L (ref 3.5–5.1)
Sodium: 132 mmol/L — ABNORMAL LOW (ref 135–145)
Total Bilirubin: 0.9 mg/dL (ref 0.3–1.2)
Total Protein: 6.9 g/dL (ref 6.5–8.1)

## 2022-05-14 LAB — RPR: RPR Ser Ql: NONREACTIVE

## 2022-05-14 LAB — CBC
HCT: 31.7 % — ABNORMAL LOW (ref 36.0–46.0)
Hemoglobin: 10.5 g/dL — ABNORMAL LOW (ref 12.0–15.0)
MCH: 24.2 pg — ABNORMAL LOW (ref 26.0–34.0)
MCHC: 33.1 g/dL (ref 30.0–36.0)
MCV: 73 fL — ABNORMAL LOW (ref 80.0–100.0)
Platelets: 307 10*3/uL (ref 150–400)
RBC: 4.34 MIL/uL (ref 3.87–5.11)
RDW: 14.4 % (ref 11.5–15.5)
WBC: 12.2 10*3/uL — ABNORMAL HIGH (ref 4.0–10.5)
nRBC: 0 % (ref 0.0–0.2)

## 2022-05-14 LAB — MAGNESIUM: Magnesium: 4.3 mg/dL — ABNORMAL HIGH (ref 1.7–2.4)

## 2022-05-14 MED ORDER — ONDANSETRON HCL 4 MG PO TABS: 4.0000 mg | ORAL_TABLET | ORAL | Status: DC | PRN

## 2022-05-14 MED ORDER — ZOLPIDEM TARTRATE 5 MG PO TABS: 5.0000 mg | ORAL_TABLET | Freq: Every evening | ORAL | Status: DC | PRN

## 2022-05-14 MED ORDER — FUROSEMIDE 20 MG PO TABS
20.0000 mg | ORAL_TABLET | Freq: Every day | ORAL | Status: DC
  Administered 2022-05-14 – 2022-05-16 (×3): 20 mg via ORAL
  Filled 2022-05-14 (×3): qty 1

## 2022-05-14 MED ORDER — COCONUT OIL OIL: 1.0000 | TOPICAL_OIL | Status: DC | PRN

## 2022-05-14 MED ORDER — ONDANSETRON HCL 4 MG/2ML IJ SOLN: 4.0000 mg | INTRAMUSCULAR | Status: DC | PRN

## 2022-05-14 MED ORDER — ACETAMINOPHEN 325 MG PO TABS
650.0000 mg | ORAL_TABLET | ORAL | Status: DC | PRN
  Administered 2022-05-14 – 2022-05-15 (×3): 650 mg via ORAL
  Filled 2022-05-14 (×3): qty 2

## 2022-05-14 MED ORDER — MISOPROSTOL 50MCG HALF TABLET
50.0000 ug | ORAL_TABLET | Freq: Once | ORAL | Status: AC
  Administered 2022-05-14: 50 ug via ORAL
  Filled 2022-05-14: qty 1

## 2022-05-14 MED ORDER — NIFEDIPINE ER OSMOTIC RELEASE 30 MG PO TB24
30.0000 mg | ORAL_TABLET | Freq: Every day | ORAL | Status: DC
  Administered 2022-05-14 – 2022-05-16 (×3): 30 mg via ORAL
  Filled 2022-05-14 (×3): qty 1

## 2022-05-14 MED ORDER — DIBUCAINE (PERIANAL) 1 % EX OINT: 1.0000 | TOPICAL_OINTMENT | CUTANEOUS | Status: DC | PRN

## 2022-05-14 MED ORDER — TETANUS-DIPHTH-ACELL PERTUSSIS 5-2.5-18.5 LF-MCG/0.5 IM SUSY: 0.5000 mL | PREFILLED_SYRINGE | Freq: Once | INTRAMUSCULAR | Status: DC

## 2022-05-14 MED ORDER — MISOPROSTOL 25 MCG QUARTER TABLET
25.0000 ug | ORAL_TABLET | Freq: Once | ORAL | Status: AC
  Administered 2022-05-14: 25 ug via VAGINAL
  Filled 2022-05-14: qty 1

## 2022-05-14 MED ORDER — IBUPROFEN 600 MG PO TABS
600.0000 mg | ORAL_TABLET | Freq: Four times a day (QID) | ORAL | Status: DC
  Administered 2022-05-14 – 2022-05-16 (×8): 600 mg via ORAL
  Filled 2022-05-14 (×8): qty 1

## 2022-05-14 MED ORDER — MAGNESIUM SULFATE 40 GM/1000ML IV SOLN
2.0000 g/h | INTRAVENOUS | Status: AC
  Administered 2022-05-14 – 2022-05-15 (×2): 2 g/h via INTRAVENOUS
  Filled 2022-05-14 (×2): qty 1000

## 2022-05-14 MED ORDER — DIPHENHYDRAMINE HCL 25 MG PO CAPS: 25.0000 mg | ORAL_CAPSULE | Freq: Four times a day (QID) | ORAL | Status: DC | PRN

## 2022-05-14 MED ORDER — SENNOSIDES-DOCUSATE SODIUM 8.6-50 MG PO TABS
2.0000 | ORAL_TABLET | Freq: Every day | ORAL | Status: DC
  Administered 2022-05-15: 2 via ORAL
  Filled 2022-05-14 (×2): qty 2

## 2022-05-14 MED ORDER — BENZOCAINE-MENTHOL 20-0.5 % EX AERO: 1.0000 | INHALATION_SPRAY | CUTANEOUS | Status: DC | PRN

## 2022-05-14 MED ORDER — SIMETHICONE 80 MG PO CHEW: 80.0000 mg | CHEWABLE_TABLET | ORAL | Status: DC | PRN

## 2022-05-14 MED ORDER — WITCH HAZEL-GLYCERIN EX PADS: 1.0000 | MEDICATED_PAD | CUTANEOUS | Status: DC | PRN

## 2022-05-14 MED ORDER — PRENATAL MULTIVITAMIN CH
1.0000 | ORAL_TABLET | Freq: Every day | ORAL | Status: DC
  Administered 2022-05-14 – 2022-05-15 (×2): 1 via ORAL
  Filled 2022-05-14 (×2): qty 1

## 2022-05-14 NOTE — Plan of Care (Signed)
Problem: Education: Goal: Knowledge of Childbirth will improve 05/14/2022 0730 by Dalia Heading, RN Outcome: Completed/Met 05/14/2022 0415 by Dalia Heading, RN Outcome: Progressing Goal: Ability to make informed decisions regarding treatment and plan of care will improve 05/14/2022 0730 by Dalia Heading, RN Outcome: Completed/Met 05/14/2022 0415 by Dalia Heading, RN Outcome: Progressing Goal: Ability to state and carry out methods to decrease the pain will improve 05/14/2022 0730 by Dalia Heading, RN Outcome: Completed/Met 05/14/2022 0415 by Dalia Heading, RN Outcome: Progressing Goal: Individualized Educational Video(s) 05/14/2022 0730 by Dalia Heading, RN Outcome: Completed/Met 05/14/2022 0415 by Dalia Heading, RN Outcome: Progressing   Problem: Coping: Goal: Ability to verbalize concerns and feelings about labor and delivery will improve 05/14/2022 0730 by Dalia Heading, RN Outcome: Completed/Met 05/14/2022 0415 by Dalia Heading, RN Outcome: Progressing   Problem: Life Cycle: Goal: Ability to make normal progression through stages of labor will improve 05/14/2022 0730 by Dalia Heading, RN Outcome: Completed/Met 05/14/2022 0415 by Dalia Heading, RN Outcome: Progressing Goal: Ability to effectively push during vaginal delivery will improve 05/14/2022 0730 by Dalia Heading, RN Outcome: Completed/Met 05/14/2022 0415 by Dalia Heading, RN Outcome: Progressing   Problem: Role Relationship: Goal: Will demonstrate positive interactions with the child 05/14/2022 0730 by Dalia Heading, RN Outcome: Completed/Met 05/14/2022 0415 by Dalia Heading, RN Outcome: Progressing   Problem: Safety: Goal: Risk of complications during labor and delivery will decrease 05/14/2022 0730 by Dalia Heading, RN Outcome: Completed/Met 05/14/2022 0415 by Dalia Heading, RN Outcome: Progressing   Problem:  Pain Management: Goal: Relief or control of pain from uterine contractions will improve 05/14/2022 0730 by Dalia Heading, RN Outcome: Completed/Met 05/14/2022 0415 by Dalia Heading, RN Outcome: Progressing   Problem: Education: Goal: Knowledge of disease or condition will improve 05/14/2022 0730 by Dalia Heading, RN Outcome: Completed/Met 05/14/2022 0415 by Dalia Heading, RN Outcome: Progressing Goal: Knowledge of the prescribed therapeutic regimen will improve 05/14/2022 0730 by Dalia Heading, RN Outcome: Completed/Met 05/14/2022 0415 by Dalia Heading, RN Outcome: Progressing   Problem: Fluid Volume: Goal: Peripheral tissue perfusion will improve 05/14/2022 0730 by Dalia Heading, RN Outcome: Completed/Met 05/14/2022 0415 by Dalia Heading, RN Outcome: Progressing   Problem: Clinical Measurements: Goal: Complications related to disease process, condition or treatment will be avoided or minimized 05/14/2022 0730 by Dalia Heading, RN Outcome: Completed/Met 05/14/2022 0415 by Dalia Heading, RN Outcome: Progressing   Problem: Education: Goal: Knowledge of General Education information will improve Description: Including pain rating scale, medication(s)/side effects and non-pharmacologic comfort measures 05/14/2022 0730 by Dalia Heading, RN Outcome: Completed/Met 05/14/2022 0415 by Dalia Heading, RN Outcome: Progressing   Problem: Health Behavior/Discharge Planning: Goal: Ability to manage health-related needs will improve 05/14/2022 0730 by Dalia Heading, RN Outcome: Completed/Met 05/14/2022 0415 by Dalia Heading, RN Outcome: Progressing   Problem: Clinical Measurements: Goal: Ability to maintain clinical measurements within normal limits will improve 05/14/2022 0730 by Dalia Heading, RN Outcome: Completed/Met 05/14/2022 0415 by Dalia Heading, RN Outcome: Progressing Goal: Will remain free  from infection 05/14/2022 0730 by Dalia Heading, RN Outcome: Completed/Met 05/14/2022 0415 by Dalia Heading, RN Outcome: Progressing Goal: Diagnostic test results will improve 05/14/2022 0730 by Dalia Heading, RN Outcome: Completed/Met 05/14/2022 0415 by Dalia Heading, RN Outcome: Progressing Goal: Respiratory complications will improve 05/14/2022 0730 by Dalia Heading, RN Outcome:  Completed/Met 05/14/2022 0415 by Dalia Heading, RN Outcome: Progressing Goal: Cardiovascular complication will be avoided 05/14/2022 0730 by Dalia Heading, RN Outcome: Completed/Met 05/14/2022 0415 by Dalia Heading, RN Outcome: Progressing   Problem: Activity: Goal: Risk for activity intolerance will decrease 05/14/2022 0730 by Dalia Heading, RN Outcome: Completed/Met 05/14/2022 0415 by Dalia Heading, RN Outcome: Progressing   Problem: Nutrition: Goal: Adequate nutrition will be maintained 05/14/2022 0730 by Dalia Heading, RN Outcome: Completed/Met 05/14/2022 0415 by Dalia Heading, RN Outcome: Progressing   Problem: Coping: Goal: Level of anxiety will decrease 05/14/2022 0730 by Dalia Heading, RN Outcome: Completed/Met 05/14/2022 0415 by Dalia Heading, RN Outcome: Progressing   Problem: Elimination: Goal: Will not experience complications related to bowel motility 05/14/2022 0730 by Dalia Heading, RN Outcome: Completed/Met 05/14/2022 0415 by Dalia Heading, RN Outcome: Progressing Goal: Will not experience complications related to urinary retention 05/14/2022 0730 by Dalia Heading, RN Outcome: Completed/Met 05/14/2022 0415 by Dalia Heading, RN Outcome: Progressing   Problem: Pain Managment: Goal: General experience of comfort will improve 05/14/2022 0730 by Dalia Heading, RN Outcome: Completed/Met 05/14/2022 0415 by Dalia Heading, RN Outcome: Progressing   Problem: Safety: Goal:  Ability to remain free from injury will improve 05/14/2022 0730 by Dalia Heading, RN Outcome: Completed/Met 05/14/2022 0415 by Dalia Heading, RN Outcome: Progressing   Problem: Skin Integrity: Goal: Risk for impaired skin integrity will decrease 05/14/2022 0730 by Dalia Heading, RN Outcome: Completed/Met 05/14/2022 0415 by Dalia Heading, RN Outcome: Progressing

## 2022-05-14 NOTE — Plan of Care (Signed)
  Problem: Education: Goal: Knowledge of Childbirth will improve Outcome: Progressing Goal: Ability to make informed decisions regarding treatment and plan of care will improve Outcome: Progressing Goal: Ability to state and carry out methods to decrease the pain will improve Outcome: Progressing Goal: Individualized Educational Video(s) Outcome: Progressing   Problem: Coping: Goal: Ability to verbalize concerns and feelings about labor and delivery will improve Outcome: Progressing   Problem: Life Cycle: Goal: Ability to make normal progression through stages of labor will improve Outcome: Progressing Goal: Ability to effectively push during vaginal delivery will improve Outcome: Progressing   Problem: Role Relationship: Goal: Will demonstrate positive interactions with the child Outcome: Progressing   Problem: Safety: Goal: Risk of complications during labor and delivery will decrease Outcome: Progressing   Problem: Pain Management: Goal: Relief or control of pain from uterine contractions will improve Outcome: Progressing   Problem: Education: Goal: Knowledge of disease or condition will improve Outcome: Progressing Goal: Knowledge of the prescribed therapeutic regimen will improve Outcome: Progressing   Problem: Fluid Volume: Goal: Peripheral tissue perfusion will improve Outcome: Progressing   Problem: Clinical Measurements: Goal: Complications related to disease process, condition or treatment will be avoided or minimized Outcome: Progressing   Problem: Education: Goal: Knowledge of General Education information will improve Description: Including pain rating scale, medication(s)/side effects and non-pharmacologic comfort measures Outcome: Progressing   Problem: Health Behavior/Discharge Planning: Goal: Ability to manage health-related needs will improve Outcome: Progressing   Problem: Clinical Measurements: Goal: Ability to maintain clinical measurements  within normal limits will improve Outcome: Progressing Goal: Will remain free from infection Outcome: Progressing Goal: Diagnostic test results will improve Outcome: Progressing Goal: Respiratory complications will improve Outcome: Progressing Goal: Cardiovascular complication will be avoided Outcome: Progressing   Problem: Activity: Goal: Risk for activity intolerance will decrease Outcome: Progressing   Problem: Nutrition: Goal: Adequate nutrition will be maintained Outcome: Progressing   Problem: Coping: Goal: Level of anxiety will decrease Outcome: Progressing   Problem: Elimination: Goal: Will not experience complications related to bowel motility Outcome: Progressing Goal: Will not experience complications related to urinary retention Outcome: Progressing   Problem: Pain Managment: Goal: General experience of comfort will improve Outcome: Progressing   Problem: Safety: Goal: Ability to remain free from injury will improve Outcome: Progressing   Problem: Skin Integrity: Goal: Risk for impaired skin integrity will decrease Outcome: Progressing   

## 2022-05-14 NOTE — Lactation Note (Signed)
This note was copied from a baby's chart. Lactation Consultation Note  Patient Name: [name] Date: 05/14/2022 Age:40 hours  LC in to room for initial visit. Lactating Parent stated: "I told them I do not want Lactation".    Consult Status Consult Status: Complete    [name] A [name] 05/14/2022, 12:14 PM

## 2022-05-14 NOTE — Lactation Note (Signed)
This note was copied from a baby's chart. Lactation Consultation Note  Patient Name: [name] [id number] Date: 05/14/2022 Age:40 hours   Birthing parent is in the bathroom upon visit. LC will come back to room at another time as possible.    [name] A [name] 05/14/2022, 11:50 AM

## 2022-05-14 NOTE — Anesthesia Postprocedure Evaluation (Signed)
Anesthesia Post Note  Patient: Sandi Mieles  Procedure(s) Performed: AN AD HOC LABOR EPIDURAL     Patient location during evaluation: Mother Baby Anesthesia Type: Epidural Level of consciousness: awake and alert and oriented Pain management: satisfactory to patient Vital Signs Assessment: post-procedure vital signs reviewed and stable Respiratory status: respiratory function stable Cardiovascular status: stable Postop Assessment: no headache, no backache, epidural receding, patient able to bend at knees, no signs of nausea or vomiting, adequate PO intake and able to ambulate Anesthetic complications: no   No notable events documented.  Last Vitals:  Vitals:   05/14/22 1500 05/14/22 1540  BP:  (!) 155/80  Pulse:  (!) 103  Resp: 18 18  Temp:  37.1 C  SpO2:  98%    Last Pain:  Vitals:   05/14/22 1540  TempSrc: Oral  PainSc:    Pain Goal: Patients Stated Pain Goal: 3 (05/14/22 0930)                 Karleen Dolphin

## 2022-05-14 NOTE — Discharge Summary (Signed)
Postpartum Discharge Summary  Date of Service updated***     Patient Name: [name] DOB: 01/01/1981 MRN: [id number]  Date of admission: 05/13/2022 Delivery date:05/14/2022  Delivering provider: Lazaro Arms  Date of discharge: 05/14/2022  Admitting diagnosis: Encounter for induction of labor [Z34.90] Intrauterine pregnancy: [redacted]w[redacted]d     Secondary diagnosis:  Principal Problem:   Preeclampsia Active Problems:   Depression with anxiety   History of cesarean section   History of VBAC   Supervision of high risk pregnancy, antepartum   Seizure disorder during pregnancy, antepartum   Encounter for induction of labor  Additional problems: ***    Discharge diagnosis: {DX.:23714}                                              Post partum procedures:{Postpartum procedures:23558} Augmentation: Cytotec Complications: Cat II tracing, [facility]  Hospital course: Induction of Labor With Vaginal Delivery   42 y.o. yo [id number] at [redacted]w[redacted]d was admitted to the hospital 05/13/2022 for induction of labor.  Indication for induction: Preeclampsia.  Patient had an labor course complicated by severe preeclampsia and Cat II tracing after complete and pushing that required vacuum assistance.  Membrane Rupture Time/Date: 5:17 AM ,05/14/2022   Delivery Method:Vaginal, Vacuum (Extractor)  Episiotomy: None  Lacerations:  None  Details of delivery can be found in separate delivery note.  Patient had a postpartum course complicated by***. Patient is discharged home 05/14/22.  Newborn Data: Birth date:05/14/2022  Birth time:6:54 AM  Gender:Female  Living status:Living  Apgars:4 ,8  Weight:3060 g   Magnesium Sulfate received: {Mag received:30440022} BMZ received: {BMZ received:30440023} Rhophylac:{Rhophylac received:30440032} GEX:{BMW:41324401} T-DaP:{Tdap:23962} Flu: {UUV:25366} Transfusion:{Transfusion received:30440034}  Physical exam  Vitals:   05/14/22 0500 05/14/22 0600 05/14/22 0719 05/14/22  0815  BP: 128/72  (!) 111/52 137/68  Pulse: (!) 102  (!) 111 (!) 113  Resp: 17  18   Temp:      TempSrc:      SpO2: 98% 97%     General: {Exam; general:21111117} Lochia: {Desc; appropriate/inappropriate:30686::"appropriate"} Uterine Fundus: {Desc; firm/soft:30687} Incision: {Exam; incision:21111123} DVT Evaluation: {Exam; dvt:2111122} Labs: Lab Results  Component Value Date   WBC 12.2 (H) 05/14/2022   HGB 10.5 (L) 05/14/2022   HCT 31.7 (L) 05/14/2022   MCV 73.0 (L) 05/14/2022   PLT 307 05/14/2022      Latest Ref Rng & Units 05/13/2022    5:21 PM  CMP  Glucose 70 - 99 mg/dL 440   BUN 6 - 20 mg/dL 6   Creatinine 3.47 - 4.25 mg/dL 9.56   Sodium 387 - 564 mmol/L 133   Potassium 3.5 - 5.1 mmol/L 4.0   Chloride 98 - 111 mmol/L 102   CO2 22 - 32 mmol/L 18   Calcium 8.9 - 10.3 mg/dL 9.5   Total Protein 6.5 - 8.1 g/dL 7.2   Total Bilirubin 0.3 - 1.2 mg/dL 0.7   Alkaline Phos 38 - 126 U/L 131   AST 15 - 41 U/L 28   ALT 0 - 44 U/L 26    Edinburgh Score:    06/04/2018   10:10 AM  Edinburgh Postnatal Depression Scale Screening Tool  I have been able to laugh and see the funny side of things. 0  I have looked forward with enjoyment to things. 0  I have blamed myself unnecessarily when things went wrong. 1  I have been anxious or worried for no good reason. 0  I have felt scared or panicky for no good reason. 0  Things have been getting on top of me. 0  I have been so unhappy that I have had difficulty sleeping. 1  I have felt sad or miserable. 0  I have been so unhappy that I have been crying. 0  The thought of harming myself has occurred to me. 0  Edinburgh Postnatal Depression Scale Total 2     After visit meds:  Allergies as of 05/14/2022       Reactions   Codeine Nausea And Vomiting   Per patient, "I cannot take percocet."     Med Rec must be completed prior to using this [facility]***        Discharge home in stable condition Infant Feeding: {Baby  feeding:23562} Infant Disposition:{CHL IP OB HOME WITH IHKVQQ:59563} Discharge instruction: per After Visit Summary and Postpartum booklet. Activity: Advance as tolerated. Pelvic rest for 6 weeks.  Diet: {OB OVFI:43329518} Future Appointments: Future Appointments  Date Time Provider Department Center  05/16/2022 10:35 AM Hermina Staggers, MD [facility] None  06/15/2022 11:30 AM Van Clines, MD [facility] None   Follow up Visit:  Message sent to [facility] by Autry-Lott on 05/14/2022  Please schedule this patient for a In person postpartum visit in 4 weeks with the following provider: MD. Additional Postpartum F/U:Postpartum Depression checkup and BP check 1 week  High risk pregnancy complicated by:  severe preE, VBAC, cHTN, seizure disorder Delivery mode:  Vaginal, Vacuum (Extractor)  Anticipated Birth Control:  Plans Interval BTL   05/14/2022 Lavonda Jumbo, DO

## 2022-05-14 NOTE — Progress Notes (Signed)
Labor Progress Note [name] is a 42 y.o. [id number] at [redacted]w[redacted]d presented for IOL.   S: Called to room by anesthesia due to fetal bradycardia.   O:  BP 132/73   Pulse (!) 107   Temp 98.4 F (36.9 C) (Oral)   Resp 18   LMP 08/19/2021   SpO2 99%  EFM: 145bpm/moderate/+accels, no decels  CVE: Dilation: 1 Effacement (%): Thick Station: Ballotable Presentation: Vertex (confirmed by bedside US, via Jerald Kief CNM) Exam by:: Autry-Lott MD  A&P: 42 y.o. [name] [redacted]w[redacted]d here for IOL 2/2 preE w/ SF.  #Labor: Fetal bradycardia following epidural. S/p phenyl epinephrine and ephedrine. Of note, was given cytotec. The nurse, patient and  Dr. Despina Hidden made aware. Consider FB/pitocin at next exam.   #Pain: Epidural #FWB: Cat II, improved to Cat I  #GBS negative  Severe preE BP elevated -Continue BP protocol and mag  Seizure disorder Off of keppra for several months. Home dose 750 mg BID.  -Start keppra 1000 mg now and consider increasing to 750 mg BID when mag is discontinued.   [name], DO 12:05 AM

## 2022-05-14 NOTE — Progress Notes (Signed)
Labor Progress Note [name] is a 42 y.o. [id number] at [redacted]w[redacted]d presented for IOL.   S: No acute concerns.   O:  BP (!) 147/83   Pulse (!) 119   Temp 98 F (36.7 C) (Oral)   Resp 18   LMP 08/19/2021   SpO2 99%  EFM: 145bpm/moderate/+accels, no decels  CVE: Dilation: 5 Effacement (%): 70 Station: -3 Presentation: Vertex Exam by:: Lenna Gilford RN  A&P: 42 y.o. [id number] [redacted]w[redacted]d here for IOL 2/2 preE w/ SF.  #Labor: Consider starting pitocin when contractions start to space out.  #Pain: Epidural #FWB: Cat I #GBS negative  Severe preE BP elevated -Continue BP protocol and mag  Seizure disorder Off of keppra for several months. Home dose 750 mg BID.  -Start keppra 1000 mg now and consider increasing to 750 mg BID when mag is discontinued.   [name], DO 5:03 AM

## 2022-05-15 NOTE — Progress Notes (Addendum)
CSW received consult for hx of Anxiety and Depression and infant loss at 12 months of age due to SIDS in 2005. CSW attempted to meet with MOB at bedside to complete assessment. When CSW entered room, MOB was sitting in hospital bed nursing infant. FOB was sitting nearby. CSW introduced self and requested to speak with MOB alone. MOB provided verbal consent to complete consult with FOB present. CSW explained reason for consult.   MOB declined social work consult. MOB denied mental health concerns during her pregnancy and since delivery. MOB was agreeable to CSW providing MOB with New Mom Checklist from Postpartum Progress. CSW encouraged MOB to utilize self-evaluation checklist during the postpartum time period and encouraged MOB to contact a medical professional if symptoms are noted at any time.    CSW identifies no further need for intervention and no barriers to discharge at this time.  Signed,  Norberto Sorenson, MSW, LCSWA, LCASA 05/15/2022 3:18 PM

## 2022-05-15 NOTE — Progress Notes (Signed)
Post Partum Day 1 VAVD Subjective: no complaints, up ad lib, voiding, tolerating PO, + flatus, and poor sleep last night  Objective: Blood pressure (!) 149/75, pulse 100, temperature 98.1 F (36.7 C), temperature source Oral, resp. rate 20, last menstrual period 08/19/2021, SpO2 98 %, unknown if currently breastfeeding.  Physical Exam:  General: alert, cooperative, and no distress Lochia: appropriate Uterine Fundus: firm Incision:  DVT Evaluation: No evidence of DVT seen on physical exam.  Recent Labs    05/13/22 1721 05/14/22 0729  HGB 11.2* 10.5*  HCT 34.7* 31.7*    Assessment/Plan: Plan for discharge tomorrow and Breastfeeding And bottle feeding Interval BTL  BP acceptable on procardia 30 xl + lasix  LOS: 2 days   Lazaro Arms, MD 05/15/2022, 7:30 AM

## 2022-05-16 ENCOUNTER — Encounter: Payer: Medicaid Other | Admitting: Obstetrics and Gynecology

## 2022-05-16 ENCOUNTER — Other Ambulatory Visit (HOSPITAL_COMMUNITY): Payer: Self-pay

## 2022-05-16 MED ORDER — FUROSEMIDE 20 MG PO TABS
20.0000 mg | ORAL_TABLET | Freq: Every day | ORAL | 0 refills | Status: DC
  Filled 2022-05-16: qty 6, 6d supply, fill #0

## 2022-05-16 MED ORDER — LEVETIRACETAM ER 500 MG PO TB24
1000.0000 mg | ORAL_TABLET | Freq: Every day | ORAL | 11 refills | Status: DC
  Filled 2022-05-16: qty 60, 30d supply, fill #0

## 2022-05-16 MED ORDER — IBUPROFEN 600 MG PO TABS
600.0000 mg | ORAL_TABLET | Freq: Four times a day (QID) | ORAL | 0 refills | Status: DC
  Filled 2022-05-16: qty 30, 8d supply, fill #0

## 2022-05-18 ENCOUNTER — Inpatient Hospital Stay (HOSPITAL_COMMUNITY)
Admission: RE | Admit: 2022-05-18 | Payer: Medicaid Other | Source: Home / Self Care | Admitting: Obstetrics and Gynecology

## 2022-05-18 ENCOUNTER — Inpatient Hospital Stay (HOSPITAL_COMMUNITY): Payer: Medicaid Other

## 2022-05-21 ENCOUNTER — Other Ambulatory Visit: Payer: Self-pay | Admitting: Obstetrics & Gynecology

## 2022-05-24 ENCOUNTER — Ambulatory Visit: Payer: Medicaid Other

## 2022-05-26 ENCOUNTER — Telehealth (HOSPITAL_COMMUNITY): Payer: Self-pay | Admitting: *Deleted

## 2022-05-26 NOTE — Telephone Encounter (Signed)
Mom reports feeling good. No concerns about herself at this time. EPDS declined, but reports feeling well emotionally [facility] score=0) Mom reports baby is doing well. Feeding, peeing, and pooping without difficulty. Safe sleep reviewed. Mom reports no concerns about baby at present.  Duffy Rhody, RN 05-26-2022 at 12:39pm

## 2022-05-27 ENCOUNTER — Other Ambulatory Visit (HOSPITAL_COMMUNITY): Payer: Self-pay

## 2022-05-27 MED ORDER — FUROSEMIDE 20 MG PO TABS
20.0000 mg | ORAL_TABLET | Freq: Every day | ORAL | 0 refills | Status: DC
  Filled 2022-05-27: qty 6, 6d supply, fill #0

## 2022-05-28 ENCOUNTER — Other Ambulatory Visit (HOSPITAL_COMMUNITY): Payer: Self-pay

## 2022-05-30 ENCOUNTER — Encounter: Payer: Medicaid Other | Admitting: Licensed Clinical Social Worker

## 2022-05-30 ENCOUNTER — Telehealth: Payer: Self-pay | Admitting: Licensed Clinical Social Worker

## 2022-05-30 NOTE — Telephone Encounter (Signed)
Called pt twice to begin mychart visit. Unable to reach pt left message requesting callback.[name] sent via text message

## 2022-06-01 ENCOUNTER — Ambulatory Visit: Payer: Medicaid Other

## 2022-06-02 ENCOUNTER — Ambulatory Visit: Payer: Medicaid Other

## 2022-06-09 ENCOUNTER — Ambulatory Visit (INDEPENDENT_AMBULATORY_CARE_PROVIDER_SITE_OTHER): Payer: Medicaid Other

## 2022-06-09 VITALS — BP 138/88 | HR 76

## 2022-06-09 DIAGNOSIS — Z0130 Encounter for examination of blood pressure without abnormal findings: Secondary | ICD-10-CM

## 2022-06-09 NOTE — Progress Notes (Signed)
Subjective:  [name] is a 42 y.o. female here for BP check.   Hypertension ROS: taking medications as instructed, no medication side effects noted, no TIA's, no chest pain on exertion, no dyspnea on exertion, and no swelling of ankles. Patient complains of intermittent headaches which is relieved by ibuprofen. Denies having any visual changes, or swelling.   Objective:  LMP 08/19/2021   Appearance alert, well appearing, and in no distress. General exam BP noted to be well controlled today in office.    Assessment:   Blood Pressure stable.   Plan:  Current treatment plan is effective, no change in therapy..   Patient has discontinued taking prescribed medication about a week after delivery. Patient advised to continue monitoring BP at home and to bring cuff with her to her PP visit scheduled 5/13 to make sure cuff is providing accurate readings.

## 2022-06-13 ENCOUNTER — Ambulatory Visit: Payer: Medicaid Other | Admitting: Obstetrics and Gynecology

## 2022-06-13 ENCOUNTER — Other Ambulatory Visit: Payer: Self-pay | Admitting: Obstetrics and Gynecology

## 2022-06-15 ENCOUNTER — Ambulatory Visit: Payer: Medicaid Other | Admitting: Neurology

## 2022-06-15 ENCOUNTER — Telehealth: Payer: Self-pay | Admitting: Neurology

## 2022-06-15 DIAGNOSIS — G40009 Localization-related (focal) (partial) idiopathic epilepsy and epileptic syndromes with seizures of localized onset, not intractable, without status epilepticus: Secondary | ICD-10-CM

## 2022-06-15 DIAGNOSIS — R9089 Other abnormal findings on diagnostic imaging of central nervous system: Secondary | ICD-10-CM

## 2022-06-15 NOTE — Telephone Encounter (Signed)
Yes, now that she is not pregnant, pls do MRI brain with and without contrast, thanks

## 2022-06-15 NOTE — Telephone Encounter (Signed)
No answer at 3:42 06/15/2022

## 2022-06-15 NOTE — Telephone Encounter (Signed)
Pt has had her baby and she did not know if you wanted to do contrast with the MR that is sch for 06-26-22. Please call and let her know   She will bring forms in as well to be filled out

## 2022-06-16 NOTE — Telephone Encounter (Signed)
Pt called no answer per DPR left a voice mail we will do MRI with and with out contrast

## 2022-06-21 ENCOUNTER — Encounter: Payer: Self-pay | Admitting: Neurology

## 2022-06-21 ENCOUNTER — Telehealth: Payer: Self-pay

## 2022-06-21 MED ORDER — DIAZEPAM 5 MG PO TABS: ORAL_TABLET | ORAL | 0 refills | Status: DC

## 2022-06-21 NOTE — Telephone Encounter (Signed)
Patient would like to know if you could prescribe an anxiety medication for her to take prior to her MRI. She states she gets "very nervous" when having a scan.

## 2022-06-21 NOTE — Telephone Encounter (Signed)
Rx sent for Valium 5mg  take 1 tablet 30 mins prior to MRI, may take second dose if needed. Pls have her have a driver for the MRI since taking medication. Thanks

## 2022-06-21 NOTE — Telephone Encounter (Signed)
Error

## 2022-06-22 ENCOUNTER — Telehealth: Payer: Self-pay | Admitting: Neurology

## 2022-06-22 NOTE — Telephone Encounter (Signed)
Pt called back in returning Heather's call °

## 2022-06-22 NOTE — Telephone Encounter (Signed)
Pt called informed Valium 5mg  take 1 tablet 30 mins prior to MRI, may take second dose if needed. Pls have her have a driver for the MRI since taking medication. Pt is asking if she can breast feed an take the valium Dr Karel Jarvis asked  it does pass into breast milk in small amounts, but if low dose or taken for short time (which it is for her just for MRI), it looks like it is okay. Pt stated that the baby is on formula as well, she was advised to give the pt formula to be safe. She stated that she will do that

## 2022-06-22 NOTE — Telephone Encounter (Signed)
See other phone note

## 2022-06-22 NOTE — Telephone Encounter (Signed)
Pt calle no answer left a voice mail to call back

## 2022-06-26 ENCOUNTER — Other Ambulatory Visit: Payer: Medicaid Other

## 2022-07-04 NOTE — Telephone Encounter (Signed)
She has not had any seizures, she said the start date can be today. Need it faxed to Nilda Simmer fax number is [phone] She said we should also have [employer] papers I remember seeing them last week

## 2022-07-04 NOTE — Telephone Encounter (Signed)
Pls let her know I have the paperwork. Pls confirm no seizures since April 2023. They are asking when she is able to return to work, what date does she want to start back? From neurological standpoint, since no seizures since April 2023, she may return to work. Main restrictions is no driving until 6 months seizure-free (which she is), no working from [place]. If she is okay with this, form will be filled out. Thanks

## 2022-07-04 NOTE — Telephone Encounter (Signed)
Is calling about her paperwork. Its on your desk

## 2022-07-05 ENCOUNTER — Ambulatory Visit: Payer: Medicaid Other | Admitting: Neurology

## 2022-07-05 DIAGNOSIS — Z0279 Encounter for issue of other medical certificate: Secondary | ICD-10-CM

## 2022-07-05 NOTE — Telephone Encounter (Signed)
Both forms faxed in today for pt to [employer] and her job

## 2022-07-05 NOTE — Telephone Encounter (Signed)
Both forms done, thanks

## 2022-07-06 ENCOUNTER — Encounter: Payer: Self-pay | Admitting: Neurology

## 2022-07-07 ENCOUNTER — Ambulatory Visit: Payer: Medicaid Other | Admitting: Obstetrics and Gynecology

## 2022-07-07 ENCOUNTER — Ambulatory Visit (INDEPENDENT_AMBULATORY_CARE_PROVIDER_SITE_OTHER): Payer: Medicaid Other | Admitting: Obstetrics and Gynecology

## 2022-07-07 DIAGNOSIS — Z30011 Encounter for initial prescription of contraceptive pills: Secondary | ICD-10-CM

## 2022-07-07 MED ORDER — NORETHINDRONE 0.35 MG PO TABS
1.0000 | ORAL_TABLET | Freq: Every day | ORAL | 11 refills | Status: DC
Start: 2022-07-07 — End: 2022-08-08

## 2022-07-07 NOTE — Progress Notes (Signed)
Post Partum Visit Note  [name] is a 42 y.o. [id number] female who presents for a postpartum visit. She is 7 weeks postpartum following a vacuum-assisted vaginal delivery.  I have fully reviewed the prenatal and intrapartum course. The delivery was at 38.2 gestational weeks.  Anesthesia: epidural. Postpartum course has been uncomplicated. Baby is doing well. Baby is feeding by breast. Bleeding no bleeding. Bowel function is  some irregularity . Bladder function is normal. Patient is not sexually active. Contraception method is abstinence.  Pt is interested in BTL.  Postpartum depression screening: negative, score 0.   The pregnancy intention screening data noted above was reviewed. Potential methods of contraception were discussed. The patient elected to proceed with No data recorded.   Edinburgh Postnatal Depression Scale - 07/07/22 1501       Edinburgh Postnatal Depression Scale:  In the Past 7 Days   I have been able to laugh and see the funny side of things. 0    I have looked forward with enjoyment to things. 0    I have blamed myself unnecessarily when things went wrong. 0    I have been anxious or worried for no good reason. 0    I have felt scared or panicky for no good reason. 0    Things have been getting on top of me. 0    I have been so unhappy that I have had difficulty sleeping. 0    I have felt sad or miserable. 0    I have been so unhappy that I have been crying. 0    The thought of harming myself has occurred to me. 0    Edinburgh Postnatal Depression Scale Total 0             Health Maintenance Due  Topic Date Due   COVID-19 Vaccine (1) Never done   Hepatitis C Screening  Never done   DTaP/Tdap/Td (1 - Tdap) Never done   PAP SMEAR-Modifier  08/27/2021    The following portions of the patient's history were reviewed and updated as appropriate: allergies, current medications, past family history, past medical history, past social history, past surgical  history, and problem list.  Review of Systems Pertinent items are noted in HPI.  Objective:  BP 131/84   Pulse 97   Wt (!) 303 lb (137.4 kg)   LMP 08/19/2021   Breastfeeding Yes   BMI 48.91 kg/m    General:  alert, cooperative, no distress, and morbidly obese   Breasts:  not indicated  Lungs: clear to auscultation bilaterally  Heart:  regular rate and rhythm  Abdomen: soft, non-tender; bowel sounds normal; no masses,  no organomegaly   Wound N/a  GU exam:  not indicated       Assessment:     Normal  postpartum exam.   Plan:   Essential components of care per ACOG recommendations:  1.  Mood and well being: Patient with negative depression screening today. Reviewed local resources for support.  - Patient tobacco use? No.   - hx of drug use? No.    2. Infant care and feeding:  -Patient currently breastmilk feeding? Yes. Reviewed importance of draining breast regularly to support lactation.  -Social determinants of health (SDOH) reviewed in EPIC. No concerns.  3. Sexuality, contraception and birth spacing - Patient does not want a pregnancy in the next year.  Desired family size is 5 children.  - Reviewed reproductive life planning. Reviewed contraceptive methods based on pt  preferences and effectiveness.  Patient desired progesterone only pills. She was given information regarding tubal ligation, IUD and nexplanon. If pt decides to pursue laparoscopic salpingectomy, she will likely need two surgeons due to technical difficulty. - Discussed birth spacing of 18 months  4. Sleep and fatigue -Encouraged family/partner/community support of 4 hrs of uninterrupted sleep to help with mood and fatigue  5. Physical Recovery  - Discussed patients delivery and complications. She describes her labor as mixed. - Patient had a  vacuum assisted vaginal delivery . Patient had a  no  laceration. Perineal healing reviewed. Patient expressed understanding - Patient has urinary  incontinence? No. - Patient is safe to resume physical and sexual activity  6.  Health Maintenance - HM due items addressed Yes - Last pap smear  Diagnosis  Date Value Ref Range Status  08/28/2018   Final   NEGATIVE FOR INTRAEPITHELIAL LESIONS OR MALIGNANCY.   Pap smear not done at today's visit. Last pap actually done 10/27/21, see care everywhere from [facility]. -Breast Cancer screening indicated? No.   7. Chronic Disease/Pregnancy Condition follow up: Hypertension Pt is no longer on anti-hypertensives, BP is borderline - PCP follow up  Warden Fillers, MD Center for [facility], [facility]

## 2022-07-28 ENCOUNTER — Encounter: Payer: Self-pay | Admitting: Neurology

## 2022-08-01 ENCOUNTER — Other Ambulatory Visit: Payer: Medicaid Other

## 2022-08-02 ENCOUNTER — Ambulatory Visit
Admission: RE | Admit: 2022-08-02 | Discharge: 2022-08-02 | Disposition: A | Discharge status: Home / Self Care | Payer: Medicaid Other | Source: Ambulatory Visit | Attending: Neurology | Admitting: Neurology

## 2022-08-02 ENCOUNTER — Other Ambulatory Visit: Payer: Self-pay

## 2022-08-02 DIAGNOSIS — R9089 Other abnormal findings on diagnostic imaging of central nervous system: Secondary | ICD-10-CM

## 2022-08-02 DIAGNOSIS — G40009 Localization-related (focal) (partial) idiopathic epilepsy and epileptic syndromes with seizures of localized onset, not intractable, without status epilepticus: Secondary | ICD-10-CM

## 2022-08-02 MED ORDER — GADOPICLENOL 0.5 MMOL/ML IV SOLN
10.0000 mL | Freq: Once | INTRAVENOUS | Status: AC | PRN
  Administered 2022-08-02: 10 mL via INTRAVENOUS

## 2022-08-08 ENCOUNTER — Encounter: Payer: Self-pay | Admitting: Neurology

## 2022-08-08 ENCOUNTER — Ambulatory Visit: Payer: Medicaid Other | Admitting: Neurology

## 2022-08-08 VITALS — BP 143/88 | HR 80 | Ht 66.0 in | Wt 311.4 lb

## 2022-08-08 DIAGNOSIS — G40009 Localization-related (focal) (partial) idiopathic epilepsy and epileptic syndromes with seizures of localized onset, not intractable, without status epilepticus: Secondary | ICD-10-CM | POA: Diagnosis not present

## 2022-08-08 DIAGNOSIS — R9089 Other abnormal findings on diagnostic imaging of central nervous system: Secondary | ICD-10-CM

## 2022-08-08 MED ORDER — LEVETIRACETAM 1000 MG PO TABS: 1000.0000 mg | ORAL_TABLET | Freq: Two times a day (BID) | ORAL | 3 refills | Status: DC
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
## 2022-09-08 ENCOUNTER — Encounter: Payer: Self-pay | Admitting: Orthopaedic Surgery

## 2022-09-08 ENCOUNTER — Ambulatory Visit (INDEPENDENT_AMBULATORY_CARE_PROVIDER_SITE_OTHER): Payer: Medicaid Other | Admitting: Orthopaedic Surgery

## 2022-09-08 DIAGNOSIS — S61412A Laceration without foreign body of left hand, initial encounter: Secondary | ICD-10-CM

## 2022-09-08 DIAGNOSIS — M79642 Pain in left hand: Secondary | ICD-10-CM

## 2022-09-08 DIAGNOSIS — S66822A Laceration of other specified muscles, fascia and tendons at wrist and hand level, left hand, initial encounter: Secondary | ICD-10-CM | POA: Diagnosis not present

## 2022-09-08 NOTE — Patient Instructions (Signed)
We are referring you to [facility] from [facility] address is [address] [place] The phone number is [phone]  The office will call you with an appointment Dr. Fara Boros
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
## 2022-10-10 ENCOUNTER — Telehealth: Payer: Self-pay | Admitting: Orthopaedic Surgery

## 2022-10-10 ENCOUNTER — Ambulatory Visit: Payer: Medicaid Other | Admitting: Orthopedic Surgery

## 2022-10-10 NOTE — Telephone Encounter (Signed)
FYI only, patient no showed appointment with hand surgeon at our [facility] office.

## 2023-02-28 ENCOUNTER — Encounter: Payer: Self-pay | Admitting: Neurology

## 2023-02-28 ENCOUNTER — Telehealth: Payer: Medicaid Other | Admitting: Neurology

## 2023-02-28 VITALS — Ht 66.0 in | Wt 200.0 lb

## 2023-02-28 DIAGNOSIS — R9089 Other abnormal findings on diagnostic imaging of central nervous system: Secondary | ICD-10-CM

## 2023-02-28 DIAGNOSIS — G40009 Localization-related (focal) (partial) idiopathic epilepsy and epileptic syndromes with seizures of localized onset, not intractable, without status epilepticus: Secondary | ICD-10-CM | POA: Diagnosis not present

## 2023-02-28 MED ORDER — LEVETIRACETAM 1000 MG PO TABS: 1000.0000 mg | ORAL_TABLET | Freq: Two times a day (BID) | ORAL | 3 refills | Status: DC

## 2023-02-28 NOTE — Patient Instructions (Signed)
Good to see you.  Continue Levetiracetam 1000mg  twice a day. If any seizure recurrence, please contact our office  2. Schedule MRI brain with and without contrast for July 2025  3. Start daily folic acid 1mg  supplement  4. Follow-up after brain MRI, call for any changes   Seizure Precautions: 1. If medication has been prescribed for you to prevent seizures, take it exactly as directed.  Do not stop taking the medicine without talking to your doctor first, even if you have not had a seizure in a long time.   2. Avoid activities in which a seizure would cause danger to yourself or to others.  Don't operate dangerous machinery, swim alone, or climb in high or dangerous places, such as on ladders, roofs, or girders.  Do not drive unless your doctor says you may.  3. If you have any warning that you may have a seizure, lay down in a safe place where you can't hurt yourself.    4.  No driving for 6 months from last seizure, as per [facility].   Please refer to the following link on the Epilepsy Foundation of America's website for more information: http://www.epilepsyfoundation.org/answerplace/Social/driving/drivingu.cfm   5.  Maintain good sleep hygiene. Avoid alcohol  6.  Notify your neurology if you are planning pregnancy or if you become pregnant.  7.  Contact your doctor if you have any problems that may be related to the medicine you are taking.  8.  Call 911 and bring the patient back to the ED if:        A.  The seizure lasts longer than 5 minutes.       B.  The patient doesn't awaken shortly after the seizure  C.  The patient has new problems such as difficulty seeing, speaking or moving  D.  The patient was injured during the seizure  E.  The patient has a temperature over 102 F (39C)  F.  The patient vomited and now is having trouble breathing

## 2023-02-28 NOTE — Progress Notes (Signed)
Virtual Visit via Video Note The purpose of this virtual visit is to provide medical care while limiting exposure to the novel coronavirus.    Consent was obtained for video visit:  Yes.   Answered questions that patient had about telehealth interaction:  Yes.     Pt location: Home Physician Location: office Name of referring provider:  No ref. provider found I connected with [name] at patients initiation/request on 02/28/2023 at  2:00 PM EST by video enabled telemedicine application and verified that I am speaking with the correct person using two identifiers. Pt MRN:  [id number] Pt DOB:  1980-11-29 Video Participants:  [name]   History of Present Illness:  The patient had a virtual video visit on 02/28/2023. 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 Since her last visit, she reports a possible seizure in July 2024. She went to sit on the bed and was not moving, then woke up on the floor. She is not sure if it was a seizure, she thinks she was sleep deprived and fell asleep, but felt sore. No tongue bite or incontinence. She denies missing medication, she is on Levetiracetam 1000mg  BID (higher dose caused headaches). Sometimes she gets nausea and feels confusion, no staring/unresponsiveness. She denies any olfactory/gustatory hallucinations, focal numbness/tingling/weakness, myoclonic jerks. No significant headache, dizziness, vision changes. She is getting better sleep with baby [name] now 24 months old, she gets 6-7 hours. She works remotely.    History on Initial Assessment 06/09/2021: This is a pleasant 43 year old right-handed woman with a history of anxiety presenting for evaluation of new onset seizures. The first seizure occurred in 05/2020, she works remotely and after grabbing lunch started feeling really sick and nauseated. She went to get a trash can then woke up in the hospital. Her husband reports he heard the fall and  found her face down stiff and shaking, she started turning pale and blue so he started doing CPR. She was confused after. She had urinary incontinence and had bitten her tongue. She was brought to [facility] ER where bloodwork and head CT were unremarkable. She had another witnessed seizure last 05/25/2021. She recalls talking to a client when she started feeling really nauseated and felt something like deja vu. She ended the call and alerted her husband that she was not feeling well. She went to the bathroom and then woke up in the ambulance. [name] reports she went to the bathroom for the trash can then he saw her pause, staring off into space then she slowly started turning to the left with low amplitude shaking, vocalizing. He held her as she started falling. She bit her tongue and had urinary incontinence, then fell asleep with sonorous respirations after. She was admitted overnight at [facility], records were reviewed. CBC, CMP unremarkable. EtOH negative, UDS positive for cannabinoids. I personally reviewed brain MRI without contrast, she was claustrophobic and unable to complete the study, with motion degraded images showing increased FLAIR signal in the left parietal lobe. She was discharged home on Levetiracetam 750mg  BID  She and her husband deny any other episodes of staring/unresponsiveness, no olfactory/gustatory hallucinations, myoclonic jerks. Once in a blue moon she would have the feelings of nausea that would not progress. She has a history of traumatic injury to the left hand in 2009 with finger extension weakness, since then she has had numbness that has been radiating up her left arm with pins and needles.  She feels it in her leg as well. No neck pain. She has back pain, no bowel/bladder dysfunction. She has been having headaches since the first seizure, it feels like her eyes are affected when staring at the screen. She saw an eye doctor and was given blue light blocking lenses that  seem to work. She lives with her husband and 3 children ages 3,5,10. She usually gets 6-8 hours of sleep. Mood can be really angry when she is woken up in the morning, this has been ongoing for years, but now she feels tired when waking up and has to give herself time otherwise she would be more moody. She used to drink a glass of red wine daily but stopped after the seizure.   Epilepsy Risk Factors:  She had a normal birth and early development.  There is no history of febrile convulsions, CNS infections such as meningitis/encephalitis, significant traumatic brain injury, neurosurgical procedures, or family history of seizures.  Diagnostic Data: 1-hour wake and sleep EEG in 05/2021 was normal.  MRI brain with and without contrast done 07/2021 did not show any acute changes. There was a small non-enhancing foci of T2 weighted/diffusion weighted signal abnormality in the left parietal lobe involving the deep cortical ribbon/superficial white matter. There was a small focus of increased signal in the left temporal lobe. 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 She does not have any symptoms of IIH.   Brain MRI with and without contrast done 08/02/22 again showed small clustered foci of T2 FLAIR hyperintense signal in the left parietal lobe (near gray-white junction), no abnormal enhancement. 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    Current Outpatient Medications on File Prior to Visit  Medication Sig Dispense Refill   acetaminophen (TYLENOL) 325 MG tablet Take 650 mg by mouth every 6 (six) hours as needed for mild pain (pain score 1-3) or headache.     Blood Pressure Monitoring (BLOOD PRESSURE KIT) DEVI 1 kit by Does not apply route once a week. 1 each 0   levETIRAcetam (KEPPRA) 1000 MG tablet Take 1 tablet (1,000 mg total) by mouth 2 (two) times daily. 180 tablet 3    cyclobenzaprine (FLEXERIL) 10 MG tablet Take 1 tablet (10 mg total) by mouth 2 (two) times daily as needed for muscle spasms. (Patient not taking: Reported on 02/28/2023) 20 tablet 0   folic acid (FOLVITE) 1 MG tablet Take 1 tablet by mouth daily. (Patient not taking: Reported on 02/28/2023)     ibuprofen (ADVIL) 600 MG tablet Take 1 tablet (600 mg total) by mouth every 6 (six) hours. (Patient not taking: Reported on 02/28/2023) 30 tablet 0   prenatal vitamin w/FE, FA (PRENATAL 1 + 1) 27-1 MG TABS tablet Take 1 tablet by mouth daily at 12 noon. (Patient not taking: Reported on 02/28/2023)     No current facility-administered medications on file prior to visit.     Observations/Objective:   Vitals:   02/28/23 1304  Weight: 200 lb (90.7 kg)  Height: 5\' 6"  (1.676 m)   GEN:  The patient appears stated age and is in NAD.  Neurological examination: Patient is awake, alert. No aphasia or dysarthria. Intact fluency and comprehension. Cranial nerves: Extraocular movements intact. No facial asymmetry. Motor: moves all extremities symmetrically, at least anti-gravity x 4.    Assessment and Plan:   This is a pleasant 43 yo RH woman with  a history of anxiety with seizures since 2022. EEG normal. Her brain MRI showed a non-enhancing lesion in the left parietal lobe involving the deep cortical ribbon/superficial white matter. 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 She went a year seizure-free until a possible seizure last July 2024 in the setting of sleep deprivation. We discussed avoidance of seizure triggers and adding a second ASM since she had side effects on higher dose LEV. She would like to stay on Levetiracetam 1000mg  BID and try to avoid triggers, she knows to call our office for any changes, at which point, would add Lamotrigine. No pregnancy plans, advised to start daily folic acid, she is not on  contraception. Interval MRI brain with and without contrast will be ordered for July 2025. She is aware of [place] driving laws to stop driving after a seizure until 6 months seizure-free. Follow-up after MRI, call for any changes.    Follow Up Instructions:    -I discussed the assessment and treatment plan with the patient. The patient was provided an opportunity to ask questions and all were answered. The patient agreed with the plan and demonstrated an understanding of the instructions.   The patient was advised to call back or seek an in-person evaluation if the symptoms worsen or if the condition fails to improve as anticipated.     Van Clines, MD

## 2023-02-28 NOTE — Addendum Note (Signed)
Addended by: Lenise Herald on: 02/28/2023 02:34 PM   Modules accepted: Orders

## 2023-03-29 ENCOUNTER — Telehealth: Payer: Self-pay | Admitting: Neurology

## 2023-03-29 DIAGNOSIS — G40009 Localization-related (focal) (partial) idiopathic epilepsy and epileptic syndromes with seizures of localized onset, not intractable, without status epilepticus: Secondary | ICD-10-CM

## 2023-03-29 DIAGNOSIS — Z79899 Other long term (current) drug therapy: Secondary | ICD-10-CM

## 2023-03-29 NOTE — Telephone Encounter (Signed)
 Pls let her know I reviewed the chart and her Keppra level was undetectable. Was she missing any doses at all? It was not in her system. Pls confirm how she is taking the Keppra, I would like to recheck level first thing before her morning dose. Thanks

## 2023-03-29 NOTE — Telephone Encounter (Signed)
 Patient called and stated she had a seizure on 03/21/23 and she wanted Dr Karel Jarvis to be aware. Caller stated she almost bit her whole tongue off and was just released from hospital 2 days ago.

## 2023-03-30 ENCOUNTER — Telehealth: Payer: Self-pay

## 2023-03-30 NOTE — Addendum Note (Signed)
 Addended by: Dimas Chyle on: 03/30/2023 09:38 AM   Modules accepted: Orders

## 2023-03-30 NOTE — Telephone Encounter (Signed)
 Pt stated that she would take her medication twice a day but some times once a day but when she remembered it would be twice a day but she did miss days take it. Pt advised that we would like to repeat her keppra level first thing before she takes her morning dose, lab order placed in epic

## 2023-03-30 NOTE — Telephone Encounter (Signed)
 error

## 2023-04-13 ENCOUNTER — Other Ambulatory Visit

## 2023-04-15 LAB — LEVETIRACETAM, IMMUNOASSAY: LEVETIRACETAM, IMMUNOASSAY: 17.6 ug/mL (ref 6.0–46.0)

## 2023-05-03 ENCOUNTER — Telehealth: Payer: Self-pay

## 2023-05-03 NOTE — Telephone Encounter (Signed)
 Pt called back paperwork is on your desk with questions answered

## 2023-05-03 NOTE — Telephone Encounter (Signed)
 Pt called with questions about her FMLA, no answer left a voice mail to call the office back

## 2023-05-10 NOTE — Telephone Encounter (Signed)
 Done, thanks

## 2023-05-11 ENCOUNTER — Telehealth: Payer: Self-pay | Admitting: Neurology

## 2023-05-11 DIAGNOSIS — Z0279 Encounter for issue of other medical certificate: Secondary | ICD-10-CM

## 2023-05-11 NOTE — Telephone Encounter (Signed)
 Paperwork was given to the front to pay fee

## 2023-05-11 NOTE — Telephone Encounter (Signed)
 Paper work has been faxed

## 2023-05-11 NOTE — Telephone Encounter (Signed)
 Pt called in stating she has been having bad headaches and anxiety. She saw her PCP and they didn't want to prescribe anything without her talking to Dr. Karel Jarvis about the headaches. She is not sure if it is one of her medications causing them or something else.

## 2023-05-15 NOTE — Telephone Encounter (Signed)
 Patient called and offered earlier appointment for 05/16/23, left VM for patient to call back if she can make it

## 2023-05-17 ENCOUNTER — Telehealth: Payer: Self-pay | Admitting: Neurology

## 2023-05-17 NOTE — Telephone Encounter (Signed)
 We got disability forms from [employer] and placed them in the provider mailbox

## 2023-05-17 NOTE — Telephone Encounter (Signed)
 Are these different from the one faxed 4/10?

## 2023-05-18 NOTE — Telephone Encounter (Signed)
 Same paperwork that was faxed 4/10

## 2023-05-29 ENCOUNTER — Encounter: Payer: Self-pay | Admitting: Neurology

## 2023-05-29 ENCOUNTER — Ambulatory Visit: Admitting: Neurology

## 2023-05-29 VITALS — BP 122/78 | HR 79 | Ht 66.0 in | Wt 295.6 lb

## 2023-05-29 DIAGNOSIS — G40009 Localization-related (focal) (partial) idiopathic epilepsy and epileptic syndromes with seizures of localized onset, not intractable, without status epilepticus: Secondary | ICD-10-CM | POA: Diagnosis not present

## 2023-05-29 DIAGNOSIS — R519 Headache, unspecified: Secondary | ICD-10-CM | POA: Diagnosis not present

## 2023-05-29 DIAGNOSIS — R9089 Other abnormal findings on diagnostic imaging of central nervous system: Secondary | ICD-10-CM

## 2023-05-29 MED ORDER — GABAPENTIN 100 MG PO CAPS: ORAL_CAPSULE | ORAL | 6 refills | Status: DC

## 2023-05-29 MED ORDER — LEVETIRACETAM 1000 MG PO TABS: 1000.0000 mg | ORAL_TABLET | Freq: Two times a day (BID) | ORAL | 3 refills | Status: DC

## 2023-05-29 NOTE — Progress Notes (Signed)
 NEUROLOGY FOLLOW UP OFFICE NOTE  [name] [id number] 08/09/1980  HISTORY OF PRESENT ILLNESS: I had the pleasure of seeing [name] in follow-up in the neurology clinic on 05/29/2023.  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 [name] recalls feeling nauseated, then woke up on the floor with a bad tongue bite. [name] was starting her period around that time. [name] denied missing any doses of Levetiracetam  1000mg  BID, however Keppra  level was undetectable. [name] states [name] was taking medication on and off, every other day. In the ER, [name] reported chest pain and had elevated troponin levels, echocardiogram normal. [name] was discharged home with Zio patch which did not show any arrhythmias, SPECT scan negative for ischemia.   Her husband has been ensuring [name] is not missing any doses since then. Repeat Keppra  level 04/2023 was 17.6.  No further seizures, however 1-2 times a week [name] wakes up with her body sore. [name] would be lying on her left side and her eye and left side of her face feel numb, as if [name] were in deep sleep. [name] has been having frequent headaches, as well as anxiety and insomnia. [name] had been previously having these headaches but they recurred recently. [name] is having a hard time sleeping, waking up at 3am, new since the seizure. There is a lot of anxiety, [name] tried to go back to work and broke down. [name] is scared to be home alone. Headaches are in the frontal region with throbbing pain radiating to the right parietal region. [name] also feels it in her neck and down her spine. [name] has noticed her left eye is "jumping," [name] states it is the eyeball and not the eyelid.    History on Initial Assessment 06/09/2021: This is a pleasant 43 year old right-handed woman with a history of  anxiety presenting for evaluation of new onset seizures. The first seizure occurred in 05/2020, [name] works remotely and after grabbing lunch started feeling really sick and nauseated. [name] went to get a trash can then woke up in the hospital. Her husband reports he heard the fall and found her face down stiff and shaking, [name] started turning pale and blue so he started doing CPR. [name] was confused after. [name] had urinary incontinence and had bitten her tongue. [name] was brought to [facility] ER where bloodwork and head CT were unremarkable. [name] had another witnessed seizure last 05/25/2021. [name] recalls talking to a client when [name] started feeling really nauseated and felt something like deja vu. [name] ended the call and alerted her husband that [name] was not feeling well. [name] went to the bathroom and then woke up in the ambulance. Darren reports [name] went to the bathroom for the trash can then he saw her pause, staring off into space then [name] slowly started turning to the left with low amplitude shaking, vocalizing. He held her as [name] started falling. [name] bit her tongue and had urinary incontinence, then fell asleep with sonorous respirations after. [name] was admitted overnight at [facility], records were reviewed. CBC, CMP unremarkable. EtOH negative, UDS positive for cannabinoids. I personally reviewed brain MRI without contrast, [name] was claustrophobic and unable to complete the study, with motion degraded images showing increased FLAIR signal in the left parietal lobe.  [name] was discharged home on Levetiracetam  750mg  BID  [name] and her husband deny any other episodes of staring/unresponsiveness, no olfactory/gustatory hallucinations, myoclonic jerks. Once in a blue moon [name] would have the feelings of nausea that would not progress. [name] has a history of traumatic injury to the left hand in 2009 with finger extension weakness, since then [name] has had numbness that has been radiating up her left arm with pins and needles. [name]  feels it in her leg as well. No neck pain. [name] has back pain, no bowel/bladder dysfunction. [name] has been having headaches since the first seizure, it feels like her eyes are affected when staring at the screen. [name] saw an eye doctor and was given blue light blocking lenses that seem to work. [name] lives with her husband and 3 children ages 3,5,10. [name] usually gets 6-8 hours of sleep. Mood can be really angry when [name] is woken up in the morning, this has been ongoing for years, but now [name] feels tired when waking up and has to give herself time otherwise [name] would be more moody. [name] used to drink a glass of red wine daily but stopped after the seizure.   Epilepsy Risk Factors:  [name] had a normal birth and early development.  There is no history of febrile convulsions, CNS infections such as meningitis/encephalitis, significant traumatic brain injury, neurosurgical procedures, or family history of seizures.  Diagnostic Data: 1-hour wake and sleep EEG in 05/2021 was normal.  MRI brain with and without contrast done 07/2021 did not show any acute changes. There was a small non-enhancing foci of T2 weighted/diffusion weighted signal abnormality in the left parietal lobe involving the deep cortical ribbon/superficial white matter. There was a small focus of increased signal in the left temporal lobe. 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 [name] does not have any symptoms of IIH.   Brain MRI with and without contrast done 08/02/22 again showed small clustered foci of T2 FLAIR hyperintense signal in the left parietal lobe (near gray-white junction), no abnormal enhancement. 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     amLODipine  (NORVASC ) 5 MG tablet Take 1 tablet by mouth daily.     Blood Pressure Monitoring (BLOOD PRESSURE KIT) DEVI 1 kit by Does not apply route once a week. 1 each 0   cyclobenzaprine  (FLEXERIL ) 10 MG tablet Take 1 tablet (10 mg total) by mouth 2 (two) times daily as needed for muscle spasms. 20 tablet 0   FEROSUL 325 (65 Fe) MG tablet Take 325 mg by mouth daily.     levETIRAcetam  (KEPPRA ) 1000 MG tablet Take 1 tablet (1,000 mg total) by mouth 2 (two) times daily. 180 tablet 3   No current facility-administered medications on file prior to visit.    ALLERGIES: Allergies  Allergen Reactions   Codeine Nausea And Vomiting    Per patient, "I cannot take percocet."    FAMILY HISTORY: Family History  Problem Relation Age of Onset   Hypertension Mother    Hypertension Maternal Aunt    Diabetes Maternal Aunt    Breast cancer Maternal Aunt        2 maternal aunts with breast cancer diagnosed at 34 and 41 yo   Hypertension Maternal Grandmother    Hypertension Maternal Grandfather    Cancer Maternal Grandfather        lung   Colon cancer Maternal Grandfather        died in his 75's.    Asthma Neg Hx     SOCIAL HISTORY: Social History   Socioeconomic History   Marital status: Married    Spouse name: Engineer, petroleum   Number of children: 3   Years of education: Not on file   Highest education level: Not on file  Occupational History   Not on file  Tobacco Use   Smoking status: Former    Current packs/day: 0.00    Types: Cigarettes, Cigars    Quit date: 07/2021    Years since quitting: 1.9    Passive exposure:  Never   Smokeless tobacco: Former   Tobacco comments:    1/2 cigar a day  Vaping Use   Vaping status: Never Used  Substance and Sexual Activity   Alcohol use: No    Comment: occasional, social drinking   Drug use: Not Currently    Comment: some times if in pain   Sexual activity: Yes    Birth control/protection: None  Other Topics Concern   Not on file  Social History Narrative   Right   Lives with husband    One story home   Social Drivers of Health   Financial Resource Strain: Low Risk  (07/15/2021)   Received from [facility]   Overall Financial Resource Strain (CARDIA)    Difficulty of Paying Living Expenses: Not hard at all  Food Insecurity: No Food Insecurity (05/13/2022)   Hunger Vital Sign    Worried About Running Out of Food in the Last Year: Never true    Ran Out of Food in the Last Year: Never true  Transportation Needs: No Transportation Needs (05/13/2022)   PRAPARE - Administrator, Civil Service (Medical): No    Lack of Transportation (Non-Medical): No  Physical Activity: Inactive (05/03/2018)   Exercise Vital Sign    Days of Exercise per Week: 0 days    Minutes of Exercise per Session: 0 min  Stress: No Stress Concern Present (07/15/2021)   Received from [facility], [facility] Occup[facility] - Occupational Stress Questionnaire    Feeling of Stress : Only a little  Social Connections: Unknown (06/01/2021)   Received from [facility]   Social Network    Social Network: Not on file  Intimate Partner Violence: Not At Risk (05/13/2022)   Humiliation, Afraid, Rape, and Kick questionnaire    Fear of Current or Ex-Partner: No    Emotionally Abused: No    Physically Abused: No    Sexually Abused: No     PHYSICAL EXAM: Vitals:   05/29/23 1300  BP: 122/78  Pulse: 79  SpO2: 100%   General: No acute distress, tearful Head:  Normocephalic/atraumatic Skin/Extremities: No rash,  no edema Neurological Exam: alert and awake. No aphasia  or dysarthria. Fund of knowledge is appropriate.   Attention and concentration are normal.   Cranial nerves: Pupils equal, round. Extraocular movements intact with no nystagmus. Visual fields full.  No facial asymmetry.  Motor: Bulk and tone normal, muscle strength 5/5 throughout with no pronator drift, chronic weakness of left finger extension from prior injury.  Finger to nose testing intact.  Gait narrow-based and steady, able to tandem walk adequately.  Romberg negative.   IMPRESSION: This is a pleasant 43 yo RH woman with a history of anxiety with seizures since 2022. EEG normal. Her brain MRI showed a non-enhancing lesion in the left parietal lobe involving the deep cortical ribbon/superficial white matter. 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 [name] had another seizure in 03/2023, now with more headaches, insomnia, anxiety. Proceed with repeat MRI brain with and without contrast to assess for interval change. We discussed the importance of medication compliance, as well as adding on a second medication, Gabapentin to help with headaches, insomnia, and anxiety. Side effects discussed, start 100mg  at bedtime with plans to uptitrate as needed/tolerated. [name] is aware of [place] driving laws to stop driving after a seizure until 6 months seizure-free. Follow-up  in 3 months, call for any changes.     Thank you for allowing me to participate in her care.  Please do not hesitate to call for any questions or concerns.    Rayfield Cairo, M.D.

## 2023-05-29 NOTE — Patient Instructions (Addendum)
 Good to see you. I hope you feel better soon.  Start Gabapentin 100mg : take 1 capsule every night. Update me in 2 weeks, we may increase dose as tolerated to help with headaches, sleep, anxiety  2. Continue Levetiracetam  (Keppra ) 1000mg : take 1 tablet twice a day  3. Keep a calendar of your symptoms as we start Gabapentin  4. Let's try to push up the MRI date  May 8th at [facility] location arrival time 5:10 pm scan is at 5:30pm   5. Follow-up in 3 months, call for any changes   Seizure Precautions: 1. If medication has been prescribed for you to prevent seizures, take it exactly as directed.  Do not stop taking the medicine without talking to your doctor first, even if you have not had a seizure in a long time.   2. Avoid activities in which a seizure would cause danger to yourself or to others.  Don't operate dangerous machinery, swim alone, or climb in high or dangerous places, such as on ladders, roofs, or girders.  Do not drive unless your doctor says you may.  3. If you have any warning that you may have a seizure, lay down in a safe place where you can't hurt yourself.    4.  No driving for 6 months from last seizure, as per [place]  state law.   Please refer to the following link on the Epilepsy Foundation of America's website for more information: http://www.epilepsyfoundation.org/answerplace/Social/driving/drivingu.cfm   5.  Maintain good sleep hygiene. Avoid alcohol.  6.  Notify your neurology if you are planning pregnancy or if you become pregnant.  7.  Contact your doctor if you have any problems that may be related to the medicine you are taking.  8.  Call 911 and bring the patient back to the ED if:        A.  The seizure lasts longer than 5 minutes.       B.  The patient doesn't awaken shortly after the seizure  C.  The patient has new problems such as difficulty seeing, speaking or moving  D.  The patient was injured during the seizure  E.  The patient has a  temperature over 102 F (39C)  F.  The patient vomited and now is having trouble breathing

## 2023-06-08 ENCOUNTER — Other Ambulatory Visit

## 2023-06-12 ENCOUNTER — Ambulatory Visit
Admission: RE | Admit: 2023-06-12 | Discharge: 2023-06-12 | Disposition: A | Discharge status: Home / Self Care | Source: Ambulatory Visit | Attending: Neurology | Admitting: Neurology

## 2023-06-12 DIAGNOSIS — R9089 Other abnormal findings on diagnostic imaging of central nervous system: Secondary | ICD-10-CM

## 2023-06-12 MED ORDER — GADOPICLENOL 0.5 MMOL/ML IV SOLN
10.0000 mL | Freq: Once | INTRAVENOUS | Status: AC | PRN
  Administered 2023-06-12: 10 mL via INTRAVENOUS

## 2023-06-20 ENCOUNTER — Telehealth: Payer: Self-pay | Admitting: Neurology

## 2023-06-20 NOTE — Telephone Encounter (Signed)
 Received paperwork from the [employer]. Wanting to know if the pt can go back to work and provide records. Paperwork is in Dr. Merita Staples office

## 2023-06-21 ENCOUNTER — Telehealth: Payer: Self-pay | Admitting: Neurology

## 2023-06-21 NOTE — Telephone Encounter (Signed)
 Pls let her know we started a low dose so she can just stop it and monitor how her period is, if no change off medication, pls have her f/u with her gynecologist.

## 2023-06-21 NOTE — Telephone Encounter (Signed)
 Office notes faxed

## 2023-06-21 NOTE — Telephone Encounter (Signed)
 Patient wants let you know how she is doing on the Gabapentin . She is having a longer period  for 3 weeks which is not normal. The medication makes her feel weird and the way she thinks and shaking.  Please call

## 2023-06-22 NOTE — Telephone Encounter (Signed)
 Pt called informed that we started a low dose so she can just stop it and monitor how her period is, if no change off medication, pls have her f/u with her gynecologist. Pt asked about her MRI she was advised that once we get her results I will call her she was informed its taken 2 - 3 weeks to get the results,

## 2023-07-04 ENCOUNTER — Telehealth: Payer: Self-pay | Admitting: Neurology

## 2023-07-04 DIAGNOSIS — Z0279 Encounter for issue of other medical certificate: Secondary | ICD-10-CM

## 2023-07-04 NOTE — Telephone Encounter (Signed)
 Is she back to work? If yes, when? If no, when does she want me to write to return?

## 2023-07-04 NOTE — Telephone Encounter (Signed)
 Received FMLA forms via fax. Spoke with the patient and she does want them filled out and faxed in once completed. $25 Form Fee has been paid. Forms are in Dr. Merita Staples box

## 2023-07-04 NOTE — Telephone Encounter (Signed)
 The [employer] called in stating they received our office notes that were faxed in on 06/21/23, but did not receive the attending physician statement. Fax number is [phone].

## 2023-07-05 NOTE — Telephone Encounter (Signed)
 Paperwork faxed

## 2023-07-05 NOTE — Telephone Encounter (Signed)
 Done. thanks

## 2023-07-05 NOTE — Telephone Encounter (Signed)
 She said that she would like to return around the 1st of July

## 2023-07-05 NOTE — Telephone Encounter (Signed)
 It was faxed 5/29. I filled it out. I also filled out another form for her.

## 2023-07-10 ENCOUNTER — Ambulatory Visit: Payer: Self-pay | Admitting: Neurology

## 2023-07-12 ENCOUNTER — Telehealth: Payer: Self-pay | Admitting: Neurology

## 2023-07-12 NOTE — Telephone Encounter (Signed)
 Pt called an informed that we did fax the paperwork to the correct fax number and that I did just refax it for her, she is going to come pick up a copy to email her self to them will place it up front,

## 2023-07-12 NOTE — Telephone Encounter (Signed)
 Pt said her job did not get the forms faxed.  Fax number is [phone]  Email- [email or web address]  From 6/4 fax- the right form - 4 different forms- she wants to confirm its the right form faxed.  The pt wants to know if you can email it to her for copy  [email or web address]  She wants a call back

## 2023-07-12 NOTE — Telephone Encounter (Signed)
-----   Message from [name] sent at 07/10/2023  5:59 PM EDT ----- Pls let her know brain MRI is stable from last year, no new changes. Thanks

## 2023-07-12 NOTE — Telephone Encounter (Signed)
 Pt called an informed brain MRI is stable from last year, no new changes.

## 2023-07-13 ENCOUNTER — Telehealth: Payer: Self-pay | Admitting: Neurology

## 2023-07-13 NOTE — Telephone Encounter (Signed)
 Patient called and states that the forms that were faxed to us  leaves at [phone] pgs  need to be re faxed they can  not read them

## 2023-07-13 NOTE — Telephone Encounter (Signed)
 Paperwork has been refaxed for pt,

## 2023-08-02 ENCOUNTER — Telehealth: Payer: Self-pay | Admitting: Neurology

## 2023-08-02 DIAGNOSIS — G40009 Localization-related (focal) (partial) idiopathic epilepsy and epileptic syndromes with seizures of localized onset, not intractable, without status epilepticus: Secondary | ICD-10-CM

## 2023-08-02 DIAGNOSIS — Z79899 Other long term (current) drug therapy: Secondary | ICD-10-CM

## 2023-08-02 NOTE — Telephone Encounter (Signed)
 Pt.s husbband called as she had 1 seizure around 7:20am-7:30am call back

## 2023-08-02 NOTE — Telephone Encounter (Signed)
 Pt and pt husband called no answer left a voice mail to call the office back   Pt c/o: seizure Missed medications?  No. Sleep deprived?  No. Alcohol intake?  No. Increased stress? No. Trigger?nauseous  Any change in medication color or shape? No. Back to their usual baseline self?  Yes.  . If no, advise go to ER Current medications prescribed by Dr. Georjean:  levETIRAcetam  (KEPPRA ) 1000 MG tablet Take 1 tablet (1,000 mg total) by mouth 2 (two) times daily   She was getting her daughters ready for day care felt nauseous and ran to the bathroom husband realized what was happening made her lay down ,  Pt is sleeping at this time ,

## 2023-08-02 NOTE — Telephone Encounter (Signed)
 Pt husband called informed Dr georjean wants her to have keppra  level checked order placed

## 2023-08-02 NOTE — Telephone Encounter (Signed)
 Can they come have her Keppra  level checked? Thanks

## 2023-08-08 ENCOUNTER — Other Ambulatory Visit: Payer: Medicaid Other

## 2023-08-16 ENCOUNTER — Encounter: Payer: Self-pay | Admitting: Neurology

## 2023-08-16 ENCOUNTER — Other Ambulatory Visit

## 2023-08-16 ENCOUNTER — Ambulatory Visit: Admitting: Neurology

## 2023-08-16 VITALS — BP 104/63 | HR 67 | Ht 66.0 in | Wt 282.0 lb

## 2023-08-16 DIAGNOSIS — Z79899 Other long term (current) drug therapy: Secondary | ICD-10-CM | POA: Diagnosis not present

## 2023-08-16 DIAGNOSIS — G40009 Localization-related (focal) (partial) idiopathic epilepsy and epileptic syndromes with seizures of localized onset, not intractable, without status epilepticus: Secondary | ICD-10-CM

## 2023-08-16 MED ORDER — LEVETIRACETAM 1000 MG PO TABS: 1000.0000 mg | ORAL_TABLET | Freq: Two times a day (BID) | ORAL | 3 refills | Status: DC

## 2023-08-16 MED ORDER — GABAPENTIN 100 MG PO CAPS: ORAL_CAPSULE | ORAL | 6 refills | Status: DC

## 2023-08-16 NOTE — Patient Instructions (Addendum)
 Good to see you.  Have bloodwork done for Keppra  level  2. Schedule 30-minute EEG, then we will plan for a 3-day home EEG  3. Restart Gabapentin  100mg : take 1 capsule every night. Update me in a month, we may increase dose as tolerated  4. Continue Keppra  1000mg  twice a day  5. This is the website for the seizure watch: raqclyl.com  6. Follow-up in 3 months, call for any changes   Seizure Precautions: 1. If medication has been prescribed for you to prevent seizures, take it exactly as directed.  Do not stop taking the medicine without talking to your doctor first, even if you have not had a seizure in a long time.   2. Avoid activities in which a seizure would cause danger to yourself or to others.  Don't operate dangerous machinery, swim alone, or climb in high or dangerous places, such as on ladders, roofs, or girders.  Do not drive unless your doctor says you may.  3. If you have any warning that you may have a seizure, lay down in a safe place where you can't hurt yourself.    4.  No driving for 6 months from last seizure, as per [place]  state law.   Please refer to the following link on the Epilepsy Foundation of America's website for more information: http://www.epilepsyfoundation.org/answerplace/Social/driving/drivingu.cfm   5.  Maintain good sleep hygiene. Avoid alcohol  6.  Notify your neurology if you are planning pregnancy or if you become pregnant.  7.  Contact your doctor if you have any problems that may be related to the medicine you are taking.  8.  Call 911 and bring the patient back to the ED if:        A.  The seizure lasts longer than 5 minutes.       B.  The patient doesn't awaken shortly after the seizure  C.  The patient has new problems such as difficulty seeing, speaking or moving  D.  The patient was injured during the seizure  E.  The patient has a temperature over 102 F (39C)  F.  The patient vomited and now is having  trouble breathing

## 2023-08-16 NOTE — Progress Notes (Signed)
 NEUROLOGY FOLLOW UP OFFICE NOTE  [name] [id number] Dec 31, 1980  HISTORY OF PRESENT ILLNESS: I had the pleasure of seeing [name] in follow-up in the neurology clinic on 08/16/2023.  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 She is alone in the office today. Records and images were personally reviewed where available. I personally reviewed follow-up brain MRI with and without contrast done 06/2023 which did not show any acute changes, the cluster of T2 bright foci was again seen in the left parietal lobe affecting cortical and subcortical regions. 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 On her last visit, she reported frequent headaches, anxiety, insomnia. Gabapentin  was added to her regimen but she called in 06/2023 to report it made her feel weird and her period last for 3 weeks which is atypical. She was advised to stop medication. She continues on Levetiracetam  1000mg  BID. Her husband called our office on 08/02/23 to report a seizure, she was getting her daughters ready when she felt nauseated and ran to the bathroom then had a convulsion lasting 3-5 minutes. Her husband caught her, she bit her tongue then fell asleep after. She has noticed seizures have occurred around the time of her period. She has not been having headaches as much, but recently she noticed the right side of her face is sore. She has been having a lot of symptoms suggestive of RLS, she would wake up from sleep and need to hop around to feel better. Iron level is low, she is now on iron supplements. PCP also discussed Gabapentin  to help with RLS.    History on Initial Assessment 06/09/2021: This is a pleasant 43 year old right-handed woman with a history of anxiety presenting for evaluation of new onset seizures. The first  seizure occurred in 05/2020, she works remotely and after grabbing lunch started feeling really sick and nauseated. She went to get a trash can then woke up in the hospital. Her husband reports he heard the fall and found her face down stiff and shaking, she started turning pale and blue so he started doing CPR. She was confused after. She had urinary incontinence and had bitten her tongue. She was brought to [facility] ER where bloodwork and head CT were unremarkable. She had another witnessed seizure last 05/25/2021. She recalls talking to a client when she started feeling really nauseated and felt something like deja vu. She ended the call and alerted her husband that she was not feeling well. She went to the bathroom and then woke up in the ambulance. Darren reports she went to the bathroom for the trash can then he saw her pause, staring off into space then she slowly started turning to the left with low amplitude shaking, vocalizing. He held her as she started falling. She bit her tongue and had urinary incontinence, then fell asleep with sonorous respirations after. She was admitted overnight at [facility], records were reviewed. CBC, CMP unremarkable. EtOH negative, UDS positive for cannabinoids. I personally reviewed brain MRI without contrast, she was claustrophobic and unable to complete the study, with motion degraded images showing increased FLAIR signal in the left parietal lobe. She was discharged home on Levetiracetam  750mg  BID  She and her husband deny any other episodes of staring/unresponsiveness, no olfactory/gustatory hallucinations, myoclonic jerks. Once in a blue moon she would have the feelings  of nausea that would not progress. She has a history of traumatic injury to the left hand in 2009 with finger extension weakness, since then she has had numbness that has been radiating up her left arm with pins and needles. She feels it in her leg as well. No neck pain. She has back pain, no  bowel/bladder dysfunction. She has been having headaches since the first seizure, it feels like her eyes are affected when staring at the screen. She saw an eye doctor and was given blue light blocking lenses that seem to work. She lives with her husband and 3 children ages 3,5,10. She usually gets 6-8 hours of sleep. Mood can be really angry when she is woken up in the morning, this has been ongoing for years, but now she feels tired when waking up and has to give herself time otherwise she would be more moody. She used to drink a glass of red wine daily but stopped after the seizure.   Epilepsy Risk Factors:  She had a normal birth and early development.  There is no history of febrile convulsions, CNS infections such as meningitis/encephalitis, significant traumatic brain injury, neurosurgical procedures, or family history of seizures.  Diagnostic Data: 1-hour wake and sleep EEG in 05/2021 was normal.  MRI brain with and without contrast done 07/2021 did not show any acute changes. There was a small non-enhancing foci of T2 weighted/diffusion weighted signal abnormality in the left parietal lobe involving the deep cortical ribbon/superficial white matter. There was a small focus of increased signal in the left temporal lobe. 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 She does not have any symptoms of IIH.   Brain MRI with and without contrast done 08/02/22 again showed small clustered foci of T2 FLAIR hyperintense signal in the left parietal lobe (near gray-white junction), no abnormal enhancement. 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     Blood Pressure Monitoring (BLOOD PRESSURE KIT) DEVI 1 kit by Does not apply route once a week. 1 each 0   FEROSUL 325 (65 Fe) MG tablet Take 325 mg by mouth daily.     gabapentin  (NEURONTIN ) 100 MG capsule Take 1 capsule every night 30 capsule 6   levETIRAcetam  (KEPPRA ) 1000 MG tablet Take 1 tablet (1,000 mg total) by mouth 2 (two) times daily. 180 tablet 3   amLODipine  (NORVASC ) 5 MG tablet Take 1 tablet by mouth daily.     cyclobenzaprine  (FLEXERIL ) 10 MG tablet Take 1 tablet (10 mg total) by mouth 2 (two) times daily as needed for muscle spasms. (Patient not taking: Reported on 08/16/2023) 20 tablet 0   No current facility-administered medications on file prior to visit.    ALLERGIES: Allergies  Allergen Reactions   Codeine Nausea And Vomiting    Per  patient, I cannot take percocet.    FAMILY HISTORY: Family History  Problem Relation Age of Onset   Hypertension Mother    Hypertension Maternal Aunt    Diabetes Maternal Aunt    Breast cancer Maternal Aunt        2 maternal aunts with breast cancer diagnosed at 7 and 41 yo   Hypertension Maternal Grandmother    Hypertension Maternal Grandfather    Cancer Maternal Grandfather        lung   Colon cancer Maternal Grandfather        died in his 47's.    Asthma Neg Hx     SOCIAL HISTORY: Social History   Socioeconomic History   Marital status: Married    Spouse name: Engineer, petroleum   Number of children: 3   Years of education: Not on file   Highest education level: Not on file  Occupational History   Not on file  Tobacco Use   Smoking status: Former    Current packs/day: 0.00    Types: Cigarettes, Cigars    Quit date:  07/2021    Years since quitting: 2.1    Passive exposure: Never   Smokeless tobacco: Former   Tobacco comments:    1/2 cigar a day  Vaping Use   Vaping status: Never Used  Substance and Sexual Activity   Alcohol use: No    Comment: occasional, social drinking   Drug use: Not Currently    Comment: some times if in pain   Sexual activity: Yes    Birth control/protection: None  Other Topics Concern   Not on file  Social History Narrative   Right   Lives with husband    One story home   Social Drivers of Health   Financial Resource Strain: Low Risk  (07/15/2021)   Received from [facility]   Overall Financial Resource Strain (CARDIA)    Difficulty of Paying Living Expenses: Not hard at all  Food Insecurity: No Food Insecurity (05/13/2022)   Hunger Vital Sign    Worried About Running Out of Food in the Last Year: Never true    Ran Out of Food in the Last Year: Never true  Transportation Needs: No Transportation Needs (05/13/2022)   PRAPARE - Administrator, Civil Service (Medical): No    Lack of Transportation (Non-Medical): No  Physical Activity: Inactive (05/03/2018)   Exercise Vital Sign    Days of Exercise per Week: 0 days    Minutes of Exercise per Session: 0 min  Stress: No Stress Concern Present (07/15/2021)   Received from [facility] - Occupational Stress Questionnaire    Feeling of Stress : Only a little  Social Connections: Unknown (06/01/2021)   Received from [facility]   Social Network    Social Network: Not on file  Intimate Partner Violence: Not At Risk (05/13/2022)   Humiliation, Afraid, Rape, and Kick questionnaire    Fear of Current or Ex-Partner: No    Emotionally Abused: No    Physically Abused: No    Sexually Abused: No     PHYSICAL EXAM: Vitals:   08/16/23 1346  BP: 104/63  Pulse: 67  SpO2: 99%   General: No acute distress, tearful Head:   Normocephalic/atraumatic Skin/Extremities: No rash, no edema Neurological Exam: alert and awake. No aphasia or dysarthria. Fund of knowledge is appropriate.  Attention and concentration are normal.   Cranial nerves: Pupils equal, round.  Extraocular movements intact with no nystagmus. Visual fields full.  No facial asymmetry.  Motor: Bulk and tone normal, muscle strength 5/5 throughout with no pronator drift.   Finger to nose testing intact.  Gait narrow-based and steady, able to tandem walk adequately.  Romberg negative.   IMPRESSION: This is a pleasant 43 yo RH woman with a history of anxiety with seizures since 2022. EEG normal. Her brain MRI showed a non-enhancing lesion in the left parietal lobe involving the deep cortical ribbon/superficial white matter. 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 She had another convulsion on 08/02/23. She is also having RLS. She is willing to re-try the Gabapentin  100mg  at bedtime, side effects discussed. Continue Levetiracetam  1000mg  BID, check Keppra  level. We discussed doing a 72-hour EEG to further characterize seizures. She is aware of [place] driving laws and has not been driving. Follow-up in 3 months, call for any changes.    Thank you for allowing me to participate in her care.  Please do not hesitate to call for any questions or concerns.    Darice Shivers, M.D.   CC: Dr. Trudy

## 2023-08-19 LAB — LEVETIRACETAM, IMMUNOASSAY: LEVETIRACETAM, IMMUNOASSAY: 28.4 ug/mL (ref 6.0–46.0)

## 2023-08-21 ENCOUNTER — Ambulatory Visit (INDEPENDENT_AMBULATORY_CARE_PROVIDER_SITE_OTHER): Admitting: Neurology

## 2023-08-21 ENCOUNTER — Ambulatory Visit: Payer: Medicaid Other | Admitting: Neurology

## 2023-08-21 DIAGNOSIS — G40009 Localization-related (focal) (partial) idiopathic epilepsy and epileptic syndromes with seizures of localized onset, not intractable, without status epilepticus: Secondary | ICD-10-CM | POA: Diagnosis not present

## 2023-08-21 NOTE — Progress Notes (Signed)
 EEG complete and ready for review.

## 2023-08-21 NOTE — Procedures (Signed)
 ELECTROENCEPHALOGRAM REPORT  Date of Study: 08/21/2023  Patient's Name: [name] MRN: [id number] Date of Birth: 10-14-1980  Referring Provider: Dr. Darice Shivers  Clinical History: This is a 43 year old woman with left parietal lesion and recurrent seizures. EEG for classification.  Medications: Keppra , Gabapentin   Technical Summary: A multichannel digital EEG recording measured by the international 10-20 system with electrodes applied with paste and impedances below 5000 ohms performed in our laboratory with EKG monitoring in an awake and asleep patient.  Hyperventilation was not performed. Photic stimulation was performed.  The digital EEG was referentially recorded, reformatted, and digitally filtered in a variety of bipolar and referential montages for optimal display.    Description: The patient is awake and asleep during the recording.  During maximal wakefulness, there is a symmetric, medium voltage 10 Hz posterior dominant rhythm that attenuates with eye opening.  The record is symmetric.  During drowsiness and sleep, there is an increase in theta slowing of the background.  Vertex waves and symmetric sleep spindles were seen. Photic stimulation did not elicit any abnormalities.  There were no epileptiform discharges or electrographic seizures seen.    EKG lead showed sinus bradycardia at 60 bpm.  Impression: This awake and asleep EEG is normal.    Clinical Correlation: A normal EEG does not exclude a clinical diagnosis of epilepsy.  If further clinical questions remain, prolonged EEG may be helpful.  Clinical correlation is advised.   Darice Shivers, M.D.

## 2023-08-29 ENCOUNTER — Ambulatory Visit: Payer: Self-pay | Admitting: Neurology

## 2023-09-22 ENCOUNTER — Encounter: Payer: Self-pay | Admitting: Neurology

## 2023-09-22 ENCOUNTER — Other Ambulatory Visit

## 2023-09-22 ENCOUNTER — Encounter: Payer: Self-pay | Admitting: Radiology

## 2023-09-25 ENCOUNTER — Telehealth: Payer: Self-pay | Admitting: Neurology

## 2023-09-25 NOTE — Telephone Encounter (Signed)
 Pt called in today , stating that [employer] ( Disability) stated that they fax over documents  to our office and they needed to be filled out by the doctor. The documents needs to be faxed back to [employer] at [phone]. Thanks

## 2023-09-25 NOTE — Telephone Encounter (Signed)
 Has not been received must forward another copy.

## 2023-09-25 NOTE — Telephone Encounter (Signed)
 I left message must send again on her voicemail.

## 2023-10-06 ENCOUNTER — Ambulatory Visit (INDEPENDENT_AMBULATORY_CARE_PROVIDER_SITE_OTHER): Admitting: Neurology

## 2023-10-06 DIAGNOSIS — G40009 Localization-related (focal) (partial) idiopathic epilepsy and epileptic syndromes with seizures of localized onset, not intractable, without status epilepticus: Secondary | ICD-10-CM

## 2023-10-06 NOTE — Progress Notes (Signed)
 Ambulatory EEG hooked up and running. Light flashing. Push button tested. Camera and event log explained. Batteries explained. Patient understood.

## 2023-10-09 ENCOUNTER — Other Ambulatory Visit

## 2023-10-09 NOTE — Progress Notes (Unsigned)
 AMB EEG discontinued.  Skin Breakdown:No Diary Returned: Yes but not notes. Pt states she didn't have any seizures but did have a spell of some sort. Could not recall day or time.

## 2023-10-15 NOTE — Procedures (Addendum)
 ELECTROENCEPHALOGRAM REPORT  Dates of Recording: 10/06/2023 11:25AM to 10/09/2023 6:06AM  Patient's Name: [name] MRN: [id number] Date of Birth: 1980-02-28  Referring Provider: Dr. Darice Shivers  Procedure: 62:58-hour ambulatory EEG  History: 43 year old woman with left parietal lesion and recurrent seizures. EEG for classification.   Medications: Keppra , Gabapentin   Technical Summary: This is a 62:58-hour multichannel digital EEG recording measured by the international 10-20 system with electrodes applied with paste and impedances below 5000 ohms performed as portable with EKG monitoring.  The digital EEG was referentially recorded, reformatted, and digitally filtered in a variety of bipolar and referential montages for optimal display.    DESCRIPTION OF RECORDING: During maximal wakefulness, the background activity consisted of a symmetric 10 Hz posterior dominant rhythm which was reactive to eye opening.  There were no epileptiform discharges or focal slowing seen in wakefulness.  During the recording, the patient progresses through wakefulness, drowsiness, and Stage 2 sleep.  Again, there were no epileptiform discharges seen.  Events: There were 63 push button events that appear accidental. Patient did not fill out diary. No video recorded to review. Electrographically, there were no EEG or EKG changes seen.  There were no electrographic seizures seen.  EKG lead was unremarkable.  IMPRESSION: This 62-hour ambulatory EEG is normal.  CLINICAL CORRELATION: A normal EEG does not exclude a clinical diagnosis of epilepsy. Typical events were not captured. If further clinical questions remain, inpatient video EEG monitoring may be helpful.   Darice Shivers, M.D.

## 2023-11-17 ENCOUNTER — Ambulatory Visit: Admitting: Neurology

## 2023-12-04 ENCOUNTER — Encounter: Payer: Self-pay | Admitting: Radiology

## 2023-12-20 ENCOUNTER — Telehealth: Payer: Self-pay | Admitting: Neurology

## 2023-12-20 ENCOUNTER — Encounter: Payer: Self-pay | Admitting: Neurology

## 2023-12-20 NOTE — Telephone Encounter (Signed)
 Caller is calling from [employer] [facility] [employer] ins to confirm if a letter that was faxed over on 12/05/23. [phone] ext [phone] and fax [phone]

## 2023-12-21 NOTE — Telephone Encounter (Signed)
 Patient was last seen in July. Can you pls f/u on how she is doing? Paperwork is asking if she is able to return to work with restrictions, is this something she can do? If yes, what accommodations? Thanks

## 2023-12-22 NOTE — Telephone Encounter (Signed)
 Pt stated that she is having really bad headaches she said she thinks that is it may be based off her period. She said is is able to go to work she work from home at this time but she may need to get a new job because her accommodations may need to be not looking at the computer all day long

## 2024-01-01 ENCOUNTER — Other Ambulatory Visit: Payer: Self-pay

## 2024-01-29 ENCOUNTER — Ambulatory Visit: Payer: Self-pay | Admitting: Neurology

## 2024-02-04 NOTE — Progress Notes (Unsigned)
 "  Virtual Visit via Video Note  This visit type was conducted with patient consent. This format is felt to be most appropriate for this patient at this time. Physical exam was limited by quality of the video and audio technology used for the visit.    Consent was obtained for video visit:  {yes no:314532} Answered questions that patient had about telehealth interaction:  {yes no:314532} Patient is aware of the limitations, risks, security and privacy concerns of performing an evaluation and management service by telemedicine. The patient expressed understanding and agreed to proceed.  Pt location: Home Physician Location: office Name of referring provider:  Trudy Vaughn FALCON, MD I connected with [name] at patients initiation/request on 02/05/2024 at  3:40 PM EST by video enabled telemedicine application and verified that I am speaking with the correct person using two identifiers. Pt MRN:  [id number] Pt DOB:  [birth year] Video Participants:  [name];  ***   History of Present Illness:  The patient had a virtual video visit on 02/05/2024. 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 Records and images were personally reviewed where available. Interval brain MRI with and without contrast done 06/2023 did not show any acute changes, the cluster of T2 bright foci was again seen in the left parietal lobe affecting cortical and subcortical regions. 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 She had another convulsion on 08/02/23 and also reported RLS symptoms on last visit. Routine EEG in 08/2023 and 62-hour ambulatory EEG in 10/2023 were normal. She is on Levetiracetam  1000mg  BID, Gabapentin  100mg  at bedtime restarted on last visit. She reported really bad headaches in 12/2023.  HAs  esp around time of period, bad HAs and body aches, cramps, right arm numb, feet go numb, also on left; 5-6days, nausea and vomiting, Neck pain, shooting down to arm Even separate from the HAs; at night wakes up with bad cramps when trying to sleep ankle or calf; not taking it as much due to drowsiness Job put from short to long term leave When on computer, vision gets blurry, now even without computer, gets blurred; has to find eye doc who takes insurance 03/21/2023 last day at work; terminated in [employer] from the sz is really bad, feels very incompetent sometimes, remembering regulations/policies; so has not been comfortable going back  Cannot go back to work until she can do the skills (skill is insurance) [facility] neurosurg neuropsych     On her last visit, she reported frequent headaches, anxiety, insomnia. Gabapentin  was added to her regimen but she called in 06/2023 to report it made her feel weird and her period last for 3 weeks which is atypical. She was advised to stop medication. She continues on Levetiracetam  1000mg  BID. Her husband called our office on 08/02/23 to report a seizure, she was getting her daughters ready when she felt nauseated and ran to the bathroom then had a convulsion lasting 3-5 minutes. Her husband caught her, she bit her tongue then fell asleep after. She has noticed seizures have occurred around the time of her period. She has not been having headaches as much, but recently she noticed the right side of her face is sore. She has been having a lot of symptoms suggestive of RLS, she would wake up from sleep and need to hop around to feel better.  Iron level is low, she is now on iron supplements. PCP also discussed Gabapentin  to help with RLS.    History on Initial Assessment 06/09/2021: This is a pleasant 44 year old right-handed woman with a history of anxiety presenting for evaluation of new onset seizures. The first seizure occurred in 05/2020, she works remotely and  after grabbing lunch started feeling really sick and nauseated. She went to get a trash can then woke up in the hospital. Her husband reports he heard the fall and found her face down stiff and shaking, she started turning pale and blue so he started doing CPR. She was confused after. She had urinary incontinence and had bitten her tongue. She was brought to [facility] where bloodwork and head CT were unremarkable. She had another witnessed seizure last 05/25/2021. She recalls talking to a client when she started feeling really nauseated and felt something like deja vu. She ended the call and alerted her husband that she was not feeling well. She went to the bathroom and then woke up in the ambulance. Darren reports she went to the bathroom for the trash can then he saw her pause, staring off into space then she slowly started turning to the left with low amplitude shaking, vocalizing. He held her as she started falling. She bit her tongue and had urinary incontinence, then fell asleep with sonorous respirations after. She was admitted overnight at [facility], records were reviewed. CBC, CMP unremarkable. EtOH negative, UDS positive for cannabinoids. I personally reviewed brain MRI without contrast, she was claustrophobic and unable to complete the study, with motion degraded images showing increased FLAIR signal in the left parietal lobe. She was discharged home on Levetiracetam  750mg  BID  She and her husband deny any other episodes of staring/unresponsiveness, no olfactory/gustatory hallucinations, myoclonic jerks. Once in a blue moon she would have the feelings of nausea that would not progress. She has a history of traumatic injury to the left hand in 2009 with finger extension weakness, since then she has had numbness that has been radiating up her left arm with pins and needles. She feels it in her leg as well. No neck pain. She has back pain, no bowel/bladder dysfunction. She has been having  headaches since the first seizure, it feels like her eyes are affected when staring at the screen. She saw an eye doctor and was given blue light blocking lenses that seem to work. She lives with her husband and 3 children ages 3,5,10. She usually gets 6-8 hours of sleep. Mood can be really angry when she is woken up in the morning, this has been ongoing for years, but now she feels tired when waking up and has to give herself time otherwise she would be more moody. She used to drink a glass of red wine daily but stopped after the seizure.   Epilepsy Risk Factors:  She had a normal birth and early development.  There is no history of febrile convulsions, CNS infections such as meningitis/encephalitis, significant traumatic brain injury, neurosurgical procedures, or family history of seizures.  Diagnostic Data: 1-hour wake and sleep EEG in 05/2021 was normal.  MRI brain with and without contrast done 07/2021 did not show any acute changes. There was a small non-enhancing foci of T2 weighted/diffusion weighted signal abnormality in the left parietal lobe involving the deep cortical ribbon/superficial white matter. There was a small focus of increased signal in the left temporal lobe. Partial empty sella with narrowing of the bilateral transverse  sinuses near the sigmoid junction was seen, which may be a normal variant. She does not have any symptoms of IIH.   Brain MRI with and without contrast done 08/02/22 again showed small clustered foci of T2 FLAIR hyperintense signal in the left parietal lobe (near gray-white junction), no abnormal enhancement. 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     Medications Ordered Prior to Encounter[1]   Observations/Objective:   There were no vitals filed for this visit. GEN:  The patient appears stated age and is in NAD.  Neurological examination: Patient is awake, alert, oriented x 3. No aphasia or dysarthria. Intact fluency and  comprehension. Remote and recent memory intact. Able to name and repeat. Cranial nerves: Extraocular movements intact with no nystagmus. No facial asymmetry. Motor: moves all extremities symmetrically, at least anti-gravity x 4. No incoordination on finger to nose testing. Gait: narrow-based and steady, able to tandem walk adequately. Negative Romberg test.    Assessment and Plan:   This is a pleasant 44 yo RH woman with a history of anxiety with seizures since 2022. EEG normal. Her brain MRI showed a non-enhancing lesion in the left parietal lobe involving the deep cortical ribbon/superficial white matter. 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 She had another convulsion on 08/02/23. She is also having RLS. She is willing to re-try the Gabapentin  100mg  at bedtime, side effects discussed. Continue Levetiracetam  1000mg  BID, check Keppra  level. We discussed doing a 72-hour EEG to further characterize seizures. She is aware of [place] driving laws and has not been driving. Follow-up in 3 months, call for any changes.    Follow Up Instructions:    -I discussed the assessment and treatment plan with the patient. The patient was provided an opportunity to ask questions and all were answered. The patient agreed with the plan and demonstrated an understanding of the instructions.   The patient was advised to call back or seek an in-person evaluation if the symptoms worsen or if the condition fails to improve as anticipated.    Total time spent on today's visit was ***minutes, including both face-to-face time and nonface-to-face time.  Time included that spent on review of records (prior notes available to me/labs/imaging if pertinent), discussing treatment and goals, answering patient's questions and  coordinating care.   Darice CHRISTELLA Shivers, MD     [1]  Current Outpatient Medications on File Prior to Visit  Medication Sig Dispense Refill   acetaminophen  (TYLENOL ) 325 MG tablet Take 650 mg by mouth every 6 (six) hours as needed for mild pain (pain score 1-3) or headache.     Blood Pressure Monitoring (BLOOD PRESSURE KIT) DEVI 1 kit by Does not apply route once a week. 1 each 0   FEROSUL 325 (65 Fe) MG tablet Take 325 mg by mouth daily.     gabapentin  (NEURONTIN ) 100 MG capsule Take 1 capsule every night 30 capsule 6   levETIRAcetam  (KEPPRA ) 1000 MG tablet Take 1 tablet (1,000 mg total) by mouth 2 (two) times daily. 180 tablet 3   No current facility-administered medications on file prior to visit.   "

## 2024-02-05 ENCOUNTER — Telehealth: Admitting: Neurology

## 2024-02-05 ENCOUNTER — Encounter: Payer: Self-pay | Admitting: Neurology

## 2024-02-05 VITALS — Ht 66.0 in | Wt 240.0 lb

## 2024-02-05 DIAGNOSIS — R413 Other amnesia: Secondary | ICD-10-CM | POA: Diagnosis not present

## 2024-02-05 DIAGNOSIS — G40009 Localization-related (focal) (partial) idiopathic epilepsy and epileptic syndromes with seizures of localized onset, not intractable, without status epilepticus: Secondary | ICD-10-CM

## 2024-02-05 DIAGNOSIS — G939 Disorder of brain, unspecified: Secondary | ICD-10-CM | POA: Diagnosis not present

## 2024-02-05 MED ORDER — GABAPENTIN 100 MG PO CAPS: ORAL_CAPSULE | ORAL | 3 refills | Status: AC

## 2024-02-05 MED ORDER — LEVETIRACETAM 1000 MG PO TABS: 1000.0000 mg | ORAL_TABLET | Freq: Two times a day (BID) | ORAL | 3 refills | Status: AC

## 2024-02-05 NOTE — Patient Instructions (Addendum)
 Good to see you!  Continue Keppra  1000mg  twice a day  2. Restart taking Gabapentin  100mg  every night  3. Referral will be sent to [facility]  4. Schedule Neurocognitive testing  5. Follow-up in 3 months, call for any changes

## 2024-02-14 DIAGNOSIS — Z0279 Encounter for issue of other medical certificate: Secondary | ICD-10-CM

## 2024-02-20 ENCOUNTER — Ambulatory Visit: Admitting: Neurology

## 2024-02-21 ENCOUNTER — Encounter: Payer: Self-pay | Admitting: Psychology

## 2024-02-26 ENCOUNTER — Institutional Professional Consult (permissible substitution): Payer: Self-pay | Admitting: Psychology

## 2024-02-26 ENCOUNTER — Ambulatory Visit: Payer: Self-pay

## 2024-02-29 ENCOUNTER — Ambulatory Visit

## 2024-02-29 ENCOUNTER — Institutional Professional Consult (permissible substitution): Admitting: Psychology

## 2024-02-29 ENCOUNTER — Encounter: Payer: Self-pay | Admitting: Psychology

## 2024-03-04 ENCOUNTER — Encounter: Payer: Self-pay | Admitting: Psychology

## 2024-03-08 ENCOUNTER — Telehealth: Payer: Self-pay | Admitting: Neurology

## 2024-03-08 NOTE — Telephone Encounter (Signed)
 [name](self) and [name](Spouse, assisted with call) called in stating that they are cancelling the appt  with neuropsych due to pt will be in [facility] on 03/19/2024 for Pre surgery and then on 03/20/24 for surgery. They wanted to make sure Dr. Georjean knows and that they were all on the same page. They will call back to reschedule once they have all the other details to avoid scheduling conflict.  [name] #: [phone]

## 2024-03-19 ENCOUNTER — Institutional Professional Consult (permissible substitution): Payer: Self-pay | Admitting: Psychology

## 2024-03-19 ENCOUNTER — Ambulatory Visit: Payer: Self-pay

## 2024-04-02 ENCOUNTER — Encounter: Payer: Self-pay | Admitting: Psychology

## 2024-06-13 ENCOUNTER — Ambulatory Visit: Admitting: Neurology
